# Patient Record
Sex: Female | Born: 1975
Health system: Southern US, Community
[De-identification: ages and names within clinical notes are randomized; demographics above are authoritative.]

## PROBLEM LIST (undated history)

## (undated) DIAGNOSIS — Z923 Personal history of irradiation: Secondary | ICD-10-CM

## (undated) DIAGNOSIS — D649 Anemia, unspecified: Secondary | ICD-10-CM

## (undated) DIAGNOSIS — B977 Papillomavirus as the cause of diseases classified elsewhere: Secondary | ICD-10-CM

## (undated) DIAGNOSIS — C099 Malignant neoplasm of tonsil, unspecified: Secondary | ICD-10-CM

## (undated) DIAGNOSIS — K219 Gastro-esophageal reflux disease without esophagitis: Secondary | ICD-10-CM

## (undated) HISTORY — DX: Anemia, unspecified: D64.9

## (undated) HISTORY — DX: Malignant neoplasm of tonsil, unspecified: C09.9

## (undated) HISTORY — DX: Papillomavirus as the cause of diseases classified elsewhere: B97.7

## (undated) HISTORY — DX: Gastro-esophageal reflux disease without esophagitis: K21.9

---

## 2003-05-28 ENCOUNTER — Other Ambulatory Visit: Admission: RE | Admit: 2003-05-28 | Discharge: 2003-05-28 | Payer: Self-pay

## 2013-12-27 ENCOUNTER — Other Ambulatory Visit (HOSPITAL_COMMUNITY): Payer: Self-pay | Admitting: Physician Assistant

## 2013-12-27 DIAGNOSIS — N921 Excessive and frequent menstruation with irregular cycle: Secondary | ICD-10-CM

## 2014-01-04 ENCOUNTER — Other Ambulatory Visit (HOSPITAL_COMMUNITY): Payer: Self-pay | Admitting: Physician Assistant

## 2014-01-04 DIAGNOSIS — N921 Excessive and frequent menstruation with irregular cycle: Secondary | ICD-10-CM

## 2014-01-07 ENCOUNTER — Ambulatory Visit (HOSPITAL_COMMUNITY): Payer: Self-pay

## 2014-01-07 ENCOUNTER — Ambulatory Visit (HOSPITAL_COMMUNITY)
Admission: RE | Admit: 2014-01-07 | Discharge: 2014-01-07 | Disposition: A | Discharge status: Home / Self Care | Payer: Self-pay | Source: Ambulatory Visit | Attending: Physician Assistant | Admitting: Physician Assistant

## 2014-01-07 ENCOUNTER — Ambulatory Visit (HOSPITAL_COMMUNITY): Admission: RE | Admit: 2014-01-07 | Payer: Self-pay | Source: Ambulatory Visit

## 2014-01-07 DIAGNOSIS — R938 Abnormal findings on diagnostic imaging of other specified body structures: Secondary | ICD-10-CM | POA: Insufficient documentation

## 2014-01-07 DIAGNOSIS — N920 Excessive and frequent menstruation with regular cycle: Secondary | ICD-10-CM | POA: Insufficient documentation

## 2014-01-07 DIAGNOSIS — N921 Excessive and frequent menstruation with irregular cycle: Secondary | ICD-10-CM

## 2014-01-07 DIAGNOSIS — N926 Irregular menstruation, unspecified: Secondary | ICD-10-CM | POA: Insufficient documentation

## 2014-01-07 DIAGNOSIS — N888 Other specified noninflammatory disorders of cervix uteri: Secondary | ICD-10-CM | POA: Insufficient documentation

## 2014-01-07 DIAGNOSIS — D251 Intramural leiomyoma of uterus: Secondary | ICD-10-CM | POA: Insufficient documentation

## 2014-02-18 ENCOUNTER — Emergency Department (HOSPITAL_COMMUNITY)
Admission: EM | Admit: 2014-02-18 | Discharge: 2014-02-18 | Disposition: A | Discharge status: Home / Self Care | Payer: Self-pay | Attending: Emergency Medicine | Admitting: Emergency Medicine

## 2014-02-18 ENCOUNTER — Encounter (HOSPITAL_COMMUNITY): Payer: Self-pay | Admitting: *Deleted

## 2014-02-18 DIAGNOSIS — Z9889 Other specified postprocedural states: Secondary | ICD-10-CM | POA: Insufficient documentation

## 2014-02-18 DIAGNOSIS — D500 Iron deficiency anemia secondary to blood loss (chronic): Secondary | ICD-10-CM | POA: Insufficient documentation

## 2014-02-18 DIAGNOSIS — R51 Headache: Secondary | ICD-10-CM | POA: Insufficient documentation

## 2014-02-18 DIAGNOSIS — N920 Excessive and frequent menstruation with regular cycle: Secondary | ICD-10-CM

## 2014-02-18 DIAGNOSIS — N939 Abnormal uterine and vaginal bleeding, unspecified: Secondary | ICD-10-CM | POA: Insufficient documentation

## 2014-02-18 LAB — CBC WITH DIFFERENTIAL/PLATELET
Basophils Absolute: 0 10*3/uL (ref 0.0–0.1)
Basophils Relative: 1 % (ref 0–1)
EOS ABS: 0.1 10*3/uL (ref 0.0–0.7)
Eosinophils Relative: 2 % (ref 0–5)
HCT: 28.3 % — ABNORMAL LOW (ref 36.0–46.0)
Hemoglobin: 9.1 g/dL — ABNORMAL LOW (ref 12.0–15.0)
LYMPHS ABS: 1.6 10*3/uL (ref 0.7–4.0)
Lymphocytes Relative: 27 % (ref 12–46)
MCH: 28.5 pg (ref 26.0–34.0)
MCHC: 32.2 g/dL (ref 30.0–36.0)
MCV: 88.7 fL (ref 78.0–100.0)
MONO ABS: 0.3 10*3/uL (ref 0.1–1.0)
MONOS PCT: 6 % (ref 3–12)
NEUTROS ABS: 3.8 10*3/uL (ref 1.7–7.7)
NEUTROS PCT: 64 % (ref 43–77)
Platelets: 289 10*3/uL (ref 150–400)
RBC: 3.19 MIL/uL — ABNORMAL LOW (ref 3.87–5.11)
RDW: 13.8 % (ref 11.5–15.5)
WBC: 5.9 10*3/uL (ref 4.0–10.5)

## 2014-02-18 LAB — BASIC METABOLIC PANEL
Anion gap: 2 — ABNORMAL LOW (ref 5–15)
BUN: 14 mg/dL (ref 6–23)
CO2: 27 mmol/L (ref 19–32)
Calcium: 8.6 mg/dL (ref 8.4–10.5)
Chloride: 109 mmol/L (ref 96–112)
Creatinine, Ser: 0.78 mg/dL (ref 0.50–1.10)
Glucose, Bld: 122 mg/dL — ABNORMAL HIGH (ref 70–99)
POTASSIUM: 3.7 mmol/L (ref 3.5–5.1)
Sodium: 138 mmol/L (ref 135–145)

## 2014-02-18 LAB — WET PREP, GENITAL
Clue Cells Wet Prep HPF POC: NONE SEEN
Trich, Wet Prep: NONE SEEN
YEAST WET PREP: NONE SEEN

## 2014-02-18 MED ORDER — MEGESTROL ACETATE 20 MG PO TABS: ORAL_TABLET | ORAL | Status: DC

## 2014-02-18 MED ORDER — FERROUS SULFATE 325 (65 FE) MG PO TABS: 325.0000 mg | ORAL_TABLET | Freq: Every day | ORAL | Status: AC

## 2014-02-18 NOTE — Discharge Instructions (Signed)
Call Dr. Johnnye Sima office and they may be able to see you sooner than you appointment.

## 2014-02-18 NOTE — ED Notes (Signed)
Vaginal bleeding since yesterday.  8-10 pads since yesterday.

## 2014-02-18 NOTE — ED Provider Notes (Signed)
CSN: [id number]     Arrival date & time 02/18/14  1336 History  This chart was scribed for [facility], NP with Richarda Blade, MD by Edison Simon, ED Scribe. This patient was seen in room APFT21/APFT21 and the patient's care was started at 4:22 PM.    Chief Complaint  Patient presents with  . Vaginal Bleeding   Patient is a 39 y.o. female presenting with vaginal bleeding. The history is provided by the patient and a relative. No language interpreter was used.  Vaginal Bleeding Quality:  Bright red Severity:  Moderate Onset quality:  Sudden Duration:  2 days Timing:  Constant Progression:  Unchanged Chronicity:  Recurrent Menstrual history:  Regular Number of pads used:  20 Possible pregnancy: no   Context: spontaneously   Relieved by:  None tried Worsened by:  Nothing tried Ineffective treatments:  None tried Associated symptoms: no back pain, no dysuria, no fever and no nausea   Risk factors: terminated pregnancy   Risk factors: no STD     HPI Comments: Heather Strickland is a 39 y.o. female who presents to the Emergency Department complaining of vaginal bleeding with onset yesterday. She states she used 10 pads yesterday and 8-10 today. She reports associated generalized weakness, lightheadedness, and headache. Per daughter who is translating, this is an ongoing problem but has been worse lately. She states the patient normally has a heavy period every month, but typically not as heavy as today. [facility] and had transvaginal and abdominal ultrasound; she was told to follow up with OBGYN but had difficulty doing so because she does not have insurance. She states she was recently approved and has not been seen yet. [facility] and was referred here. She states she was told in [place] she had cyst and inflamed area revealed by ultrasound but is unsure of specifics. She states she used to use birth control pills, last used 1  year ago. She states she last had sexual intercourse 1 year ago. She denies history of STIs. She reports 2 prior C-sections but denies other surgeries. She states she has been pregnant 6 times and has 4 living children; she had 2 abortions. She reports prior abnormal pap smear and anemia 6 years ago. She never followed up after the smear and states that is the last time she had any gynecological care. She notes that she occasionally has difficulty breathing and pain beneath her left breast but denies having this recently. She denies fever, chills, nausea, or vomiting. She denies urinary symptoms including dysuria, frequency, and back pain. She denies other respiratory symptoms including cough, cold, or congestion.   History reviewed. No pertinent past medical history. Past Surgical History  Procedure Laterality Date  . Cesarean section     History reviewed. No pertinent family history. History  Substance Use Topics  . Smoking status: Never Smoker   . Smokeless tobacco: Not on file  . Alcohol Use: No   OB History    No data available     Review of Systems  Constitutional: Negative for fever and chills.  HENT: Negative for congestion.   Respiratory: Negative for cough.   Gastrointestinal: Negative for nausea and vomiting.  Genitourinary: Positive for vaginal bleeding. Negative for dysuria, urgency, frequency and flank pain.  Musculoskeletal: Negative for back pain.  Neurological: Positive for weakness, light-headedness and headaches.  All other systems reviewed and are negative.     Allergies  Review of patient's allergies indicates no known allergies.  Home Medications   Prior to Admission medications   Medication Sig Start Date End Date Taking? Authorizing Provider  ferrous sulfate 325 (65 FE) MG tablet Take 1 tablet (325 mg total) by mouth daily. 02/18/14   Chester, NP  megestrol (MEGACE) 20 MG tablet Take two tablets (40 mg) three times per day time three days,  then  take two tablets (40 mg) two times per day time three days,  then take two tablets (40 mg)once per day 02/18/14   Ashley Murrain, NP   BP 123/76 mmHg  Pulse 75  Temp(Src) 98.2 F (36.8 C) (Oral)  Resp 16  Ht 5\' 7"  (1.702 m)  Wt 135 lb (61.236 kg)  BMI 21.14 kg/m2  SpO2 100%  LMP 01/15/2014 Physical Exam  Constitutional: She is oriented to person, place, and time. She appears well-developed and well-nourished. No distress.  HENT:  Head: Normocephalic and atraumatic.  Eyes: Conjunctivae and EOM are normal.  Neck: Normal range of motion. Neck supple.  Cardiovascular: Normal rate and regular rhythm.   Pulmonary/Chest: Effort normal and breath sounds normal.  Abdominal: Soft. Bowel sounds are normal. There is no tenderness.  Genitourinary:  External genitalia without lesions. Moderate vaginal bleeding with clots in vaginal vault. No vaginal or cervical lacerations noted. No CMT, no adnexal tenderness, uterus slightly enlarged.   Musculoskeletal: Normal range of motion.  Neurological: She is alert and oriented to person, place, and time. No cranial nerve deficit.  Skin: Skin is warm and dry.  Psychiatric: She has a normal mood and affect. Her behavior is normal.  Nursing note and vitals reviewed.   ED Course  Procedures (including critical care time)  DIAGNOSTIC STUDIES: Oxygen Saturation is 100% on room air, normal by my interpretation.    COORDINATION OF CARE: 4:36 PM Discussed treatment plan with patient at beside, including pelvic exam and consultation with gynecologist. The patient agrees with the plan and has no further questions at this time.  Ultrasound ordered from [facility] 12/28 showed a 1/4 cm complex lesion in the right uterine fundus suggestive of a small intramural fibroid. A 13 mm thick endometrium with mild heterogeneity diffusely but no focal abnormality identified. 2.3 cm likely CLC in the right ovary.   Labs Review Results for orders placed or performed during  the hospital encounter of 02/18/14 (from the past 24 hour(s))  CBC with Differential     Status: Abnormal   Collection Time: 02/18/14  3:01 PM  Result Value Ref Range   WBC 5.9 4.0 - 10.5 K/uL   RBC 3.19 (L) 3.87 - 5.11 MIL/uL   Hemoglobin 9.1 (L) 12.0 - 15.0 g/dL   HCT 28.3 (L) 36.0 - 46.0 %   MCV 88.7 78.0 - 100.0 fL   MCH 28.5 26.0 - 34.0 pg   MCHC 32.2 30.0 - 36.0 g/dL   RDW 13.8 11.5 - 15.5 %   Platelets 289 150 - 400 K/uL   Neutrophils Relative % 64 43 - 77 %   Neutro Abs 3.8 1.7 - 7.7 K/uL   Lymphocytes Relative 27 12 - 46 %   Lymphs Abs 1.6 0.7 - 4.0 K/uL   Monocytes Relative 6 3 - 12 %   Monocytes Absolute 0.3 0.1 - 1.0 K/uL   Eosinophils Relative 2 0 - 5 %   Eosinophils Absolute 0.1 0.0 - 0.7 K/uL   Basophils Relative 1 0 - 1 %   Basophils Absolute 0.0  0.0 - 0.1 K/uL  Basic metabolic panel     Status: Abnormal   Collection Time: 02/18/14  3:01 PM  Result Value Ref Range   Sodium 138 135 - 145 mmol/L   Potassium 3.7 3.5 - 5.1 mmol/L   Chloride 109 96 - 112 mmol/L   CO2 27 19 - 32 mmol/L   Glucose, Bld 122 (H) 70 - 99 mg/dL   BUN 14 6 - 23 mg/dL   Creatinine, Ser 0.78 0.50 - 1.10 mg/dL   Calcium 8.6 8.4 - 10.5 mg/dL   GFR calc non Af Amer >90 >90 mL/min   GFR calc Af Amer >90 >90 mL/min   Anion gap 2 (L) 5 - 15  Wet prep, genital     Status: Abnormal   Collection Time: 02/18/14  5:24 PM  Result Value Ref Range   Yeast Wet Prep HPF POC NONE SEEN NONE SEEN   Trich, Wet Prep NONE SEEN NONE SEEN   Clue Cells Wet Prep HPF POC NONE SEEN NONE SEEN   WBC, Wet Prep HPF POC FEW (A) NONE SEEN  BP 123/76 mmHg  Pulse 75  Temp(Src) 98.2 F (36.8 C) (Oral)  Resp 16  Ht 5\' 7"  (1.702 m)  Wt 135 lb (61.236 kg)  BMI 21.14 kg/m2  SpO2 100%  LMP 01/15/2014  Consult with Dr. Glo Herring, will start Megace and she will follow up in the office.  The patient is not orthostatic  MDM  39 y.o. female with heavy vaginal bleeding and feeling weak for the past 2 days. Stable for d/c  without uterine hemorrhage. Will treat with Megace for bleeding and iron for anemia and Dr. Glo Herring will see the patient in the office for follow up. Discussed findings and plan of care with the patient using the translator and she voices understanding and agrees with plan.  Final diagnoses:  Excessive vaginal bleeding  Anemia due to blood loss   I personally performed the services described in this documentation, which was scribed in my presence. The recorded information has been reviewed and is accurate.    [address] [place], NP 02/18/14 [place], MD 02/19/14 [phone]

## 2014-02-19 LAB — HIV ANTIBODY (ROUTINE TESTING W REFLEX): HIV SCREEN 4TH GENERATION: NONREACTIVE

## 2014-02-19 LAB — GC/CHLAMYDIA PROBE AMP (~~LOC~~) NOT AT ARMC
CHLAMYDIA, DNA PROBE: NEGATIVE
Neisseria Gonorrhea: NEGATIVE

## 2014-02-19 LAB — RPR: RPR Ser Ql: NONREACTIVE

## 2014-02-20 ENCOUNTER — Encounter: Payer: Self-pay | Admitting: Obstetrics & Gynecology

## 2014-02-21 ENCOUNTER — Ambulatory Visit (INDEPENDENT_AMBULATORY_CARE_PROVIDER_SITE_OTHER): Payer: Self-pay | Admitting: Obstetrics and Gynecology

## 2014-02-21 ENCOUNTER — Encounter: Payer: Self-pay | Admitting: Obstetrics and Gynecology

## 2014-02-21 VITALS — BP 90/60 | Wt 130.0 lb

## 2014-02-21 DIAGNOSIS — N920 Excessive and frequent menstruation with regular cycle: Secondary | ICD-10-CM

## 2014-02-21 NOTE — Progress Notes (Addendum)
Patient ID: Heather Strickland, female   DOB: [birth year], 39 y.o.   MRN: [id number]  This chart was scribed for Heather Kind, MD by Donato Schultz, ED Scribe. This patient was seen in Room 1 and the patient's care was started at 2:34 PM.   [facility] Visit  Patient name: Heather Strickland MRN [id number]  Date of birth: [birth year]  CC & HPI:  [name] Busing is a 39 y.o. female with a history of anemia presenting today for follow-up from a recent hospitalization.  She was admitted to [facility] ED on 02/18/2014 for heavy vaginal bleeding with associated weakness.  She was discharged with Megace and Iron supplements.  She is not in any pain currently but she does experience pain with her menses.  She has not been sexually active for a year and does not use any contraception.    ROS:  All systems have been reviewed and are negative unless otherwise indicated in the HPI.   Pertinent History Reviewed:   Reviewed: Significant for cesarean section and anemia  Medical         Past Medical History  Diagnosis Date  . Anemia                               Surgical Hx:    Past Surgical History  Procedure Laterality Date  . Cesarean section     Medications: Reviewed & Updated - see associated section                       Current outpatient prescriptions:  .  ferrous sulfate 325 (65 FE) MG tablet, Take 1 tablet (325 mg total) by mouth daily., Disp: 30 tablet, Rfl: 0 .  megestrol (MEGACE) 20 MG tablet, Take two tablets (40 mg) three times per day time three days,  then take two tablets (40 mg) two times per day time three days,  then take two tablets (40 mg)once per day, Disp: 50 tablet, Rfl: 0   Social History: Reviewed -  reports that she has never smoked. She has never used smokeless tobacco.  Objective Findings:  Vitals: Blood pressure 90/60, weight 130 lb (58.968 kg), last menstrual period 02/16/2014.  Physical Examination: General appearance - alert, well appearing,  and in no distress, oriented to person, place, and time and normal appearing weight Pelvic - normal external genitalia, vulva, vagina, cervix, uterus and adnexa,  VULVA: normal appearing vulva with no masses, tenderness or lesions,  VAGINA: normal appearing vagina with normal color and discharge, no lesions,  CERVIX:  large multiparous cervix without discharge or lesions, well supported UTERUS: uterus is normal size, shape, consistency and nontender, anterior, upper limits normal size ADNEXA: normal adnexa in size, nontender and no masses  Assessment & Plan:   A:  1. Menorrhagia, needs contraception  P:  1. Family planning candidate, eligible at [facility] Department for IUD under state systems that would cover iud placement     I personally performed the services described in this documentation, which was scribed in my presence. The recorded information has been reviewed and considered accurate. It has been edited as necessary during review. Heather Kind, MD

## 2014-02-21 NOTE — Progress Notes (Signed)
Patient ID: Heather Strickland, female   DOB: 1975/07/15, 39 y.o.   MRN: [id number] Pt was seen in the ED for heavy bleeding and was given Megace and Iron .

## 2014-02-27 ENCOUNTER — Other Ambulatory Visit (HOSPITAL_COMMUNITY): Payer: Self-pay | Admitting: Nurse Practitioner

## 2014-02-27 DIAGNOSIS — Z1231 Encounter for screening mammogram for malignant neoplasm of breast: Secondary | ICD-10-CM

## 2014-03-04 ENCOUNTER — Ambulatory Visit (HOSPITAL_COMMUNITY)
Admission: RE | Admit: 2014-03-04 | Discharge: 2014-03-04 | Disposition: A | Discharge status: Home / Self Care | Payer: Self-pay | Source: Ambulatory Visit | Attending: Nurse Practitioner | Admitting: Nurse Practitioner

## 2014-03-04 DIAGNOSIS — Z1231 Encounter for screening mammogram for malignant neoplasm of breast: Secondary | ICD-10-CM

## 2015-06-18 DIAGNOSIS — Z139 Encounter for screening, unspecified: Secondary | ICD-10-CM

## 2015-06-19 ENCOUNTER — Ambulatory Visit: Payer: Self-pay | Admitting: Physician Assistant

## 2015-06-19 ENCOUNTER — Encounter: Payer: Self-pay | Admitting: Physician Assistant

## 2015-06-19 VITALS — BP 98/70 | HR 73 | Temp 97.9°F | Ht 62.0 in | Wt 168.0 lb

## 2015-06-19 DIAGNOSIS — E669 Obesity, unspecified: Secondary | ICD-10-CM

## 2015-06-19 DIAGNOSIS — J029 Acute pharyngitis, unspecified: Secondary | ICD-10-CM

## 2015-06-19 DIAGNOSIS — K219 Gastro-esophageal reflux disease without esophagitis: Secondary | ICD-10-CM

## 2015-06-19 DIAGNOSIS — R7303 Prediabetes: Secondary | ICD-10-CM | POA: Insufficient documentation

## 2015-06-19 DIAGNOSIS — D649 Anemia, unspecified: Secondary | ICD-10-CM | POA: Insufficient documentation

## 2015-06-19 LAB — HEMOGLOBIN: Hemoglobin: 12.8 g/dL (ref 11.7–15.5)

## 2015-06-19 LAB — HEMATOCRIT: HCT: 38.8 % (ref 35.0–45.0)

## 2015-06-19 LAB — POCT RAPID STREP A (OFFICE): Rapid Strep A Screen: NEGATIVE

## 2015-06-19 LAB — HEMOGLOBIN A1C
HEMOGLOBIN A1C: 6.2 % — AB (ref <5.7)
MEAN PLASMA GLUCOSE: 131 mg/dL

## 2015-06-19 MED ORDER — PANTOPRAZOLE SODIUM 40 MG PO TBEC: 40.0000 mg | DELAYED_RELEASE_TABLET | Freq: Every day | ORAL | Status: DC

## 2015-06-19 NOTE — Progress Notes (Signed)
BP 98/70 mmHg  Pulse 73  Temp(Src) 97.9 F (36.6 C)  Ht 5\' 2"  (1.575 m)  Wt 168 lb (76.204 kg)  BMI 30.72 kg/m2  SpO2 97%   Subjective:    Patient ID: Heather Strickland, female    DOB: [birth year], 40 y.o.   MRN: [id number]  HPI: Heather Strickland is a 40 y.o. female presenting on 06/19/2015 for New Patient (Initial Visit)   HPI   Pt seen here dec 2015 and jan 2016.  Pt c/o pain with swallowing.  She c/o her spit is dark brown.  She says she has streaks of blood in her saliva when she brushes her teeth.  She does not vomit blood.  She c/o pain with swallowing for about a week.  No cough. No fever. + R EA. No nasal congestion. + sneezing.   She works doing sewing  Relevant past medical, surgical, family and social history reviewed and updated as indicated. Interim medical history since our last visit reviewed. Allergies and medications reviewed and updated.  CURRENT MEDS: Iron mirena  Review of Systems  Constitutional: Positive for chills and fatigue. Negative for fever, diaphoresis, appetite change and unexpected weight change.  HENT: Positive for congestion, ear pain, hearing loss, sneezing, sore throat, trouble swallowing and voice change. Negative for dental problem, drooling, facial swelling and mouth sores.   Eyes: Positive for itching. Negative for pain, discharge, redness and visual disturbance.  Respiratory: Positive for choking, chest tightness and shortness of breath. Negative for cough and wheezing.   Cardiovascular: Positive for palpitations. Negative for chest pain and leg swelling.  Gastrointestinal: Negative for vomiting, abdominal pain, diarrhea, constipation and blood in stool.  Endocrine: Positive for cold intolerance and polydipsia. Negative for heat intolerance.  Genitourinary: Negative for dysuria, hematuria and decreased urine volume.  Musculoskeletal: Positive for back pain. Negative for arthralgias and gait problem.  Skin: Negative for rash.   Allergic/Immunologic: Positive for environmental allergies.  Neurological: Negative for seizures, syncope, light-headedness and headaches.  Hematological: Negative for adenopathy.  Psychiatric/Behavioral: Positive for dysphoric mood and agitation. Negative for suicidal ideas. The patient is nervous/anxious.     Per HPI unless specifically indicated above     Objective:    BP 98/70 mmHg  Pulse 73  Temp(Src) 97.9 F (36.6 C)  Ht 5\' 2"  (1.575 m)  Wt 168 lb (76.204 kg)  BMI 30.72 kg/m2  SpO2 97%  Wt Readings from Last 3 Encounters:  06/19/15 168 lb (76.204 kg)  02/21/14 130 lb (58.968 kg)  02/18/14 135 lb (61.236 kg)    Physical Exam  Constitutional: She is oriented to person, place, and time. She appears well-developed and well-nourished.  HENT:  Head: Normocephalic and atraumatic.  Right Ear: Hearing, tympanic membrane, external ear and ear canal normal.  Left Ear: Hearing, tympanic membrane, external ear and ear canal normal.  Nose: Nose normal.  Mouth/Throat: Uvula is midline and oropharynx is clear and moist. No oropharyngeal exudate.  Eyes: Conjunctivae and EOM are normal. Pupils are equal, round, and reactive to light.  Neck: Neck supple. No thyromegaly present.  Cardiovascular: Normal rate and regular rhythm.   Pulmonary/Chest: Effort normal and breath sounds normal. She has no wheezes.  Abdominal: Soft. Bowel sounds are normal. She exhibits no mass. There is no hepatosplenomegaly. There is no tenderness.  Musculoskeletal: She exhibits no edema.  Lymphadenopathy:    She has no cervical adenopathy.  Neurological: She is alert and oriented to person, place, and time. Gait normal.  Skin: Skin is  warm and dry.  Psychiatric: She has a normal mood and affect. Her behavior is normal.  Vitals reviewed.   RST negative    Assessment & Plan:   Encounter Diagnoses  Name Primary?  . Gastroesophageal reflux disease, esophagitis presence not specified Yes  . Prediabetes    . Obesity, unspecified   . Anemia, unspecified anemia type   . Sore throat     -rx protonix -Also encouraged to take otc antihistamine -Get labs drawn today (before starting protonix) -F/u 1 month.  RTO sooner prn

## 2015-06-20 LAB — H. PYLORI BREATH TEST: H. pylori Breath Test: DETECTED — AB

## 2015-07-21 ENCOUNTER — Encounter: Payer: Self-pay | Admitting: Physician Assistant

## 2015-07-21 ENCOUNTER — Ambulatory Visit: Payer: Self-pay | Admitting: Physician Assistant

## 2015-07-21 VITALS — BP 104/60 | HR 84 | Temp 97.9°F | Ht 62.0 in | Wt 170.6 lb

## 2015-07-21 DIAGNOSIS — E669 Obesity, unspecified: Secondary | ICD-10-CM

## 2015-07-21 DIAGNOSIS — A048 Other specified bacterial intestinal infections: Secondary | ICD-10-CM | POA: Insufficient documentation

## 2015-07-21 DIAGNOSIS — D649 Anemia, unspecified: Secondary | ICD-10-CM

## 2015-07-21 DIAGNOSIS — K219 Gastro-esophageal reflux disease without esophagitis: Secondary | ICD-10-CM

## 2015-07-21 DIAGNOSIS — R7303 Prediabetes: Secondary | ICD-10-CM

## 2015-07-21 MED ORDER — PANTOPRAZOLE SODIUM 40 MG PO TBEC: 40.0000 mg | DELAYED_RELEASE_TABLET | Freq: Every day | ORAL | Status: DC

## 2015-07-21 MED ORDER — AMOXICILLIN 500 MG PO CAPS: ORAL_CAPSULE | ORAL | Status: DC

## 2015-07-21 MED ORDER — CLARITHROMYCIN 500 MG PO TABS: 500.0000 mg | ORAL_TABLET | Freq: Two times a day (BID) | ORAL | Status: DC

## 2015-07-21 NOTE — Patient Instructions (Addendum)
Take pantoprazole twice daily with your two antibiotics for the h pylori infection.  Tome pantoprazole dos veces diarios con su antibioticos para la infeccion Hpylori.

## 2015-07-21 NOTE — Progress Notes (Signed)
BP 104/60 mmHg  Pulse 84  Temp(Src) 97.9 F (36.6 C)  Ht 5\' 2"  (1.575 m)  Wt 170 lb 9.6 oz (77.384 kg)  BMI 31.20 kg/m2  SpO2 97%   Subjective:    Patient ID: Heather Strickland, female    DOB: 10/23/1975, 41 y.o.   MRN: [id number]  HPI: Heather Strickland is a 40 y.o. female presenting on 07/21/2015 for Gastroesophageal Reflux and Anemia   HPI   Pt states her swallowing is improved since starting the protonix.   Relevant past medical, surgical, family and social history reviewed and updated as indicated. Interim medical history since our last visit reviewed. Allergies and medications reviewed and updated.  Current outpatient prescriptions:  .  ferrous sulfate 325 (65 FE) MG tablet, Take 1 tablet (325 mg total) by mouth daily., Disp: 30 tablet, Rfl: 0 .  levonorgestrel (MIRENA) 20 MCG/24HR IUD, 1 each by Intrauterine route once., Disp: , Rfl:  .  pantoprazole (PROTONIX) 40 MG tablet, Take 1 tablet (40 mg total) by mouth daily. Tome una tableta por boca diaria, Disp: 30 tablet, Rfl: 1   Review of Systems  Constitutional: Positive for fatigue. Negative for fever, chills, diaphoresis, appetite change and unexpected weight change.  HENT: Positive for dental problem. Negative for congestion, drooling, ear pain, facial swelling, hearing loss, mouth sores, sneezing, sore throat, trouble swallowing and voice change.   Eyes: Negative for pain, discharge, redness, itching and visual disturbance.  Respiratory: Positive for cough. Negative for choking, shortness of breath and wheezing.   Cardiovascular: Negative for chest pain, palpitations and leg swelling.  Gastrointestinal: Negative for vomiting, abdominal pain, diarrhea, constipation and blood in stool.  Endocrine: Negative for cold intolerance, heat intolerance and polydipsia.  Genitourinary: Negative for dysuria, hematuria and decreased urine volume.  Musculoskeletal: Negative for back pain, arthralgias and gait problem.   Skin: Negative for rash.  Allergic/Immunologic: Negative for environmental allergies.  Neurological: Negative for seizures, syncope, light-headedness and headaches.  Hematological: Negative for adenopathy.  Psychiatric/Behavioral: Negative for suicidal ideas, dysphoric mood and agitation. The patient is nervous/anxious.     Per HPI unless specifically indicated above     Objective:    BP 104/60 mmHg  Pulse 84  Temp(Src) 97.9 F (36.6 C)  Ht 5\' 2"  (1.575 m)  Wt 170 lb 9.6 oz (77.384 kg)  BMI 31.20 kg/m2  SpO2 97%  Wt Readings from Last 3 Encounters:  07/21/15 170 lb 9.6 oz (77.384 kg)  06/19/15 168 lb (76.204 kg)  02/21/14 130 lb (58.968 kg)    Physical Exam  Constitutional: She is oriented to person, place, and time. She appears well-developed and well-nourished.  HENT:  Head: Normocephalic and atraumatic.  Neck: Neck supple.  Cardiovascular: Normal rate and regular rhythm.   Pulmonary/Chest: Effort normal and breath sounds normal.  Abdominal: Soft. Bowel sounds are normal. She exhibits no mass. There is no hepatosplenomegaly. There is no tenderness.  Musculoskeletal: She exhibits no edema.  Lymphadenopathy:    She has no cervical adenopathy.  Neurological: She is alert and oriented to person, place, and time.  Skin: Skin is warm and dry.  Psychiatric: She has a normal mood and affect. Her behavior is normal.  Vitals reviewed.   Results for orders placed or performed in visit on 06/19/15  Hemoglobin  Result Value Ref Range   Hemoglobin 12.8 11.7 - 15.5 g/dL  Hematocrit  Result Value Ref Range   HCT 38.8 35.0 - 45.0 %  H. pylori breath test  Result Value  Ref Range   H. pylori Breath Test DETECTED (A) Not Detected  HgB A1c  Result Value Ref Range   Hgb A1c MFr Bld 6.2 (H) <5.7 %   Mean Plasma Glucose 131 mg/dL  POCT rapid strep A  Result Value Ref Range   Rapid Strep A Screen Negative Negative      Assessment & Plan:   Encounter Diagnoses  Name Primary?   . H. pylori infection Yes  . Gastroesophageal reflux disease, esophagitis presence not specified   . Anemia, unspecified anemia type   . Prediabetes   . Obesity, unspecified     -reviewed labs with pt -anemia doing good -treat h pylori- amoxil, clarithromycin, protonix (bid while on the abx) -counseled on Prediabetes and handout given -f/u 2 months.  RTO sooner prn

## 2015-09-11 NOTE — Congregational Nurse Program (Signed)
Congregational Nurse Program Note  Date of Encounter: 06/18/2015  Past Medical History: Past Medical History:  Diagnosis Date  . Anemia   . GERD (gastroesophageal reflux disease)     Encounter Details:  Client was assisted with appointment at the [facility] in [place]. Client has complaints of spitting up blood when brushing teeth. VS today BP- 103 /74 P-74. Client does state having a sore throat about a month ago, throat feels a little swollen. Tarri Fuller CNP [phone]

## 2015-09-22 ENCOUNTER — Ambulatory Visit: Payer: Self-pay | Admitting: Physician Assistant

## 2015-09-29 ENCOUNTER — Ambulatory Visit: Payer: Self-pay | Admitting: Physician Assistant

## 2015-09-29 ENCOUNTER — Encounter: Payer: Self-pay | Admitting: Physician Assistant

## 2015-09-29 VITALS — BP 120/74 | HR 84 | Temp 98.1°F | Ht 62.0 in | Wt 167.4 lb

## 2015-09-29 DIAGNOSIS — K219 Gastro-esophageal reflux disease without esophagitis: Secondary | ICD-10-CM

## 2015-09-29 DIAGNOSIS — R1310 Dysphagia, unspecified: Secondary | ICD-10-CM

## 2015-09-29 NOTE — Progress Notes (Signed)
BP 120/74 (BP Location: Left Arm, Patient Position: Sitting, Cuff Size: Normal)   Pulse 84   Temp 98.1 F (36.7 C)   Ht 5\' 2"  (1.575 m)   Wt 167 lb 6.4 oz (75.9 kg)   SpO2 97%   BMI 30.62 kg/m    Subjective:    Patient ID: Heather Strickland, female    DOB: [birth year], 40 y.o.   MRN: [id number]  HPI: Heather Strickland is a 40 y.o. female presenting on 09/29/2015 for Follow-up   HPI   Pt took her h pylori meds. She is feeling better- her stomach.  Her throat feels tight when she swallows. Pt also says she has small amount blood when she spits  Relevant past medical, surgical, family and social history reviewed and updated as indicated. Interim medical history since our last visit reviewed. Allergies and medications reviewed and updated.   Current Outpatient Prescriptions:  .  ferrous sulfate 325 (65 FE) MG tablet, Take 1 tablet (325 mg total) by mouth daily., Disp: 30 tablet, Rfl: 0 .  levonorgestrel (MIRENA) 20 MCG/24HR IUD, 1 each by Intrauterine route once., Disp: , Rfl:  .  pantoprazole (PROTONIX) 40 MG tablet, Take 1 tablet (40 mg total) by mouth daily. Tome una tableta por boca diaria, Disp: 30 tablet, Rfl: 1  Review of Systems  Constitutional: Negative for appetite change, chills, diaphoresis, fatigue, fever and unexpected weight change.  HENT: Positive for sneezing and voice change. Negative for congestion, dental problem, drooling, ear pain, facial swelling, hearing loss, mouth sores, sore throat and trouble swallowing.   Eyes: Negative for pain, discharge, redness, itching and visual disturbance.  Respiratory: Negative for cough, choking, shortness of breath and wheezing.   Cardiovascular: Negative for chest pain, palpitations and leg swelling.  Gastrointestinal: Negative for abdominal pain, blood in stool, constipation, diarrhea and vomiting.  Endocrine: Positive for cold intolerance and polydipsia. Negative for heat intolerance.  Genitourinary: Negative  for decreased urine volume, dysuria and hematuria.  Musculoskeletal: Positive for back pain. Negative for arthralgias and gait problem.  Skin: Negative for rash.  Allergic/Immunologic: Negative for environmental allergies.  Neurological: Negative for seizures, syncope, light-headedness and headaches.  Hematological: Negative for adenopathy.  Psychiatric/Behavioral: Negative for agitation, dysphoric mood and suicidal ideas. The patient is not nervous/anxious.     Per HPI unless specifically indicated above     Objective:    BP 120/74 (BP Location: Left Arm, Patient Position: Sitting, Cuff Size: Normal)   Pulse 84   Temp 98.1 F (36.7 C)   Ht 5\' 2"  (1.575 m)   Wt 167 lb 6.4 oz (75.9 kg)   SpO2 97%   BMI 30.62 kg/m   Wt Readings from Last 3 Encounters:  09/29/15 167 lb 6.4 oz (75.9 kg)  07/21/15 170 lb 9.6 oz (77.4 kg)  06/19/15 168 lb (76.2 kg)    Physical Exam  Constitutional: She is oriented to person, place, and time. She appears well-developed and well-nourished.  HENT:  Head: Normocephalic and atraumatic.  Neck: Neck supple.  Cardiovascular: Normal rate and regular rhythm.   Pulmonary/Chest: Effort normal and breath sounds normal.  Abdominal: Soft. Bowel sounds are normal. She exhibits no mass. There is no hepatosplenomegaly. There is no tenderness.  Musculoskeletal: She exhibits no edema.  Lymphadenopathy:    She has no cervical adenopathy.  Neurological: She is alert and oriented to person, place, and time.  Skin: Skin is warm and dry.  Psychiatric: She has a normal mood and affect. Her behavior is normal.  Vitals reviewed.       Assessment & Plan:   Encounter Diagnoses  Name Primary?  . Gastroesophageal reflux disease, esophagitis presence not specified Yes  . Dysphagia     -Refer to GI. Gave [facility] discount application -pt to continue current medications -F/u 1 month to make sure pt has turned in her CD application and referral made.  RTO sooner prn  worsening or new symptoms

## 2015-10-01 ENCOUNTER — Encounter: Payer: Self-pay | Admitting: Physician Assistant

## 2015-10-02 ENCOUNTER — Encounter: Payer: Self-pay | Admitting: Internal Medicine

## 2015-10-23 ENCOUNTER — Other Ambulatory Visit: Payer: Self-pay

## 2015-10-23 ENCOUNTER — Encounter: Payer: Self-pay | Admitting: Nurse Practitioner

## 2015-10-23 ENCOUNTER — Ambulatory Visit (INDEPENDENT_AMBULATORY_CARE_PROVIDER_SITE_OTHER): Payer: Self-pay | Admitting: Nurse Practitioner

## 2015-10-23 VITALS — BP 128/81 | HR 70 | Temp 98.0°F | Ht 62.0 in | Wt 164.6 lb

## 2015-10-23 DIAGNOSIS — K219 Gastro-esophageal reflux disease without esophagitis: Secondary | ICD-10-CM

## 2015-10-23 DIAGNOSIS — A048 Other specified bacterial intestinal infections: Secondary | ICD-10-CM

## 2015-10-23 NOTE — Patient Instructions (Signed)
1. Continue taking Protonix 2. We will schedule your procedure for you 3. Follow-up here based on what they tell you after your procedure

## 2015-10-23 NOTE — Progress Notes (Signed)
Primary Care Physician:  Soyla Dryer, PA-C Primary Gastroenterologist:  Dr. Gala Romney  Chief Complaint  Patient presents with  . Dysphagia    x1 year  . Gastroesophageal Reflux    HPI:   Heather Strickland is a 40 y.o. female who presents On referral from primary care for evaluation of GERD and dysphagia. PCP notes reviewed. The patient was seen 06/19/2015 for odynophagia. She was given a prescription for Protonix. She was reevaluated 07/21/2015 and indicated improving symptoms on Protonix. H. pylori breath test was noted to be positive and she was started on a regimen of Amoxil, clarithromycin, and Protonix. Most recent visit on 09/29/2015 noted improved stomach symptoms after treatment of H. pylori but noted some dysphagia symptoms. She was referred to GI. It is also noted she is intermittently anemic but is seen for menorrhagia and has been on po iron.  Patient initially had daughter-in-law on the phone to translate. She was told we need to use an Research officer, political party and the translator line was contacted. Also, her demographics were updated by the staff to list primary language as Spanish and "translator needed" changed to "yes."   Today she states her stomach isn't better. She has had throat bleeding. Heartburn symptoms initially improved but is having esophageal burning. Denies dysphagia and odynophagia. Burning is worse after eating, worse with spicy foods or chili. When asked to point to where it hurts she rubs her upper-esophageal area.  Protonix has helped. Denies abdominal pain. Has nausea which is worse after eating, no vomiting. Denies hematochezia, melena. Stools are dark, is taking iron supplement. Denies chest pain, dyspnea, dizziness, lightheadedness, syncope, near syncope. Denies any other upper or lower GI symptoms.  Past Medical History:  Diagnosis Date  . Anemia   . GERD (gastroesophageal reflux disease)     Past Surgical History:  Procedure Laterality Date  .  CESAREAN SECTION      Current Outpatient Prescriptions  Medication Sig Dispense Refill  . levonorgestrel (MIRENA) 20 MCG/24HR IUD 1 each by Intrauterine route once.    . pantoprazole (PROTONIX) 40 MG tablet Take 1 tablet (40 mg total) by mouth daily. Tome una tableta por boca diaria 30 tablet 1  . ferrous sulfate 325 (65 FE) MG tablet Take 1 tablet (325 mg total) by mouth daily. (Patient not taking: Reported on 10/23/2015) 30 tablet 0   No current facility-administered medications for this visit.     Allergies as of 10/23/2015  . (No Known Allergies)    Family History  Problem Relation Age of Onset  . Diabetes Mother   . Hyperlipidemia Mother     Social History   Social History  . Marital status: Single    Spouse name: N/A  . Number of children: N/A  . Years of education: N/A   Occupational History  . Not on file.   Social History Main Topics  . Smoking status: Never Smoker  . Smokeless tobacco: Never Used  . Alcohol use No  . Drug use: No  . Sexual activity: Not Currently    Birth control/ protection: None, IUD   Other Topics Concern  . Not on file   Social History Narrative  . No narrative on file    Review of Systems: General: Negative for anorexia, weight loss, fever, chills, fatigue, weakness. Eyes: Negative for vision changes.  ENT: Negative for hoarseness, difficulty swallowing , nasal congestion. CV: Negative for chest pain, angina, palpitations, dyspnea on exertion, peripheral edema.  Respiratory: Negative for dyspnea at rest,  dyspnea on exertion, cough, sputum, wheezing.  GI: See history of present illness. GU:  Negative for dysuria, hematuria, urinary incontinence, urinary frequency, nocturnal urination.  MS: Negative for joint pain, low back pain.  Derm: Negative for rash or itching.  Neuro: Negative for weakness, abnormal sensation, seizure, frequent headaches, memory loss, confusion.  Psych: Negative for anxiety, depression, suicidal ideation,  hallucinations.  Endo: Negative for unusual weight change.  Heme: Negative for bruising or bleeding. Allergy: Negative for rash or hives.    Physical Exam: BP 128/81   Pulse 70   Temp 98 F (36.7 C) (Oral)   Ht 5\' 2"  (1.575 m)   Wt 164 lb 9.6 oz (74.7 kg)   BMI 30.11 kg/m  General:   Alert and oriented. Pleasant and cooperative. Well-nourished and well-developed.  Head:  Normocephalic and atraumatic. Eyes:  Without icterus, sclera clear and conjunctiva pink.  Ears:  Normal auditory acuity. Mouth:  No deformity or lesions, oral mucosa pink.  Throat/Neck:  Supple, without mass or thyromegaly. Cardiovascular:  S1, S2 present without murmurs appreciated. Normal pulses noted. Extremities without clubbing or edema. Respiratory:  Clear to auscultation bilaterally. No wheezes, rales, or rhonchi. No distress.  Gastrointestinal:  +BS, soft, non-tender and non-distended. No HSM noted. No guarding or rebound. No masses appreciated.  Rectal:  Deferred  Musculoskalatal:  Symmetrical without gross deformities. Normal posture. Skin:  Intact without significant lesions or rashes. Neurologic:  Alert and oriented x4;  grossly normal neurologically. Psych:  Alert and cooperative. Normal mood and affect. Heme/Lymph/Immune: No significant cervical adenopathy. No excessive bruising noted.    10/23/2015 8:19 AM   Disclaimer: This note was dictated with voice recognition software. Similar sounding words can inadvertently be transcribed and may not be corrected upon review.

## 2015-10-23 NOTE — Progress Notes (Signed)
CC'ED TO PCP 

## 2015-10-23 NOTE — Assessment & Plan Note (Signed)
Recent H. pylori by hydrogen breath test status post treatment with triple therapy. Her symptoms are persistent, although improved. We'll proceed with upper endoscopy as noted above.

## 2015-10-23 NOTE — Assessment & Plan Note (Signed)
The patient has complained of GERD symptoms including esophageal burning, nausea. She thinks is causing her to cough up some blood. She was started on Protonix which has helped, but her symptoms have returned. She also had H. pylori and is status post treatment as noted below. Given her persistent symptoms, history of H. pylori recently, we will proceed with upper endoscopy to further evaluate. Continue Protonix for now. Return for follow-up based on postprocedure recommendations.  Proceed with EGD with Dr. Gala Romney in near future: the risks, benefits, and alternatives have been discussed with the patient in detail. The patient states understanding and desires to proceed.  The patient is not on any anticoagulants, anxiolytics, chronic pain medications, or antidepressants. Conscious sedation should be adequate for her procedure.

## 2015-10-29 ENCOUNTER — Ambulatory Visit: Payer: Self-pay | Admitting: Physician Assistant

## 2015-10-29 ENCOUNTER — Encounter: Payer: Self-pay | Admitting: Physician Assistant

## 2015-10-29 VITALS — BP 116/72 | HR 85 | Temp 98.1°F | Ht 62.0 in | Wt 167.6 lb

## 2015-10-29 DIAGNOSIS — K219 Gastro-esophageal reflux disease without esophagitis: Secondary | ICD-10-CM

## 2015-10-29 DIAGNOSIS — D649 Anemia, unspecified: Secondary | ICD-10-CM

## 2015-10-29 LAB — HEMOGLOBIN: HEMOGLOBIN: 12.5 g/dL (ref 11.7–15.5)

## 2015-10-29 NOTE — Progress Notes (Signed)
   BP 116/72 (BP Location: Left Arm, Patient Position: Sitting, Cuff Size: Normal)   Pulse 85   Temp 98.1 F (36.7 C) (Other (Comment))   Ht 5\' 2"  (1.575 m)   Wt 167 lb 9.6 oz (76 kg)   SpO2 98%   BMI 30.65 kg/m    Subjective:    Patient ID: Heather Strickland, female    DOB: 05-25-1975, 40 y.o.   MRN: [id number]  HPI: Heather Strickland is a 40 y.o. female presenting on 10/29/2015 for Dysphagia   HPI   Pt seen by GI for dysphagia.  She has EGD scheduled for next week.   Relevant past medical, surgical, family and social history reviewed and updated as indicated. Interim medical history since our last visit reviewed. Allergies and medications reviewed and updated.   Current Outpatient Prescriptions:  .  ferrous sulfate 325 (65 FE) MG tablet, Take 1 tablet (325 mg total) by mouth daily., Disp: 30 tablet, Rfl: 0 .  levonorgestrel (MIRENA) 20 MCG/24HR IUD, 1 each by Intrauterine route once., Disp: , Rfl:  .  pantoprazole (PROTONIX) 40 MG tablet, Take 1 tablet (40 mg total) by mouth daily. Tome una tableta por boca diaria, Disp: 30 tablet, Rfl: 1  Review of Systems  Constitutional: Negative for fever.  Respiratory: Negative for shortness of breath.   Cardiovascular: Negative for chest pain.  Gastrointestinal: Negative for abdominal pain.  Psychiatric/Behavioral: Negative for agitation, dysphoric mood and suicidal ideas. The patient is not nervous/anxious.     Per HPI unless specifically indicated above     Objective:    BP 116/72 (BP Location: Left Arm, Patient Position: Sitting, Cuff Size: Normal)   Pulse 85   Temp 98.1 F (36.7 C) (Other (Comment))   Ht 5\' 2"  (1.575 m)   Wt 167 lb 9.6 oz (76 kg)   SpO2 98%   BMI 30.65 kg/m   Wt Readings from Last 3 Encounters:  10/29/15 167 lb 9.6 oz (76 kg)  10/23/15 164 lb 9.6 oz (74.7 kg)  09/29/15 167 lb 6.4 oz (75.9 kg)    Physical Exam  Constitutional: She is oriented to person, place, and time. She appears  well-developed and well-nourished.  HENT:  Head: Normocephalic and atraumatic.  Neck: Neck supple.  Cardiovascular: Normal rate and regular rhythm.   Pulmonary/Chest: Effort normal and breath sounds normal.  Abdominal: Soft. Bowel sounds are normal. She exhibits no mass. There is no hepatosplenomegaly. There is no tenderness.  Musculoskeletal: She exhibits no edema.  Lymphadenopathy:    She has no cervical adenopathy.  Neurological: She is alert and oriented to person, place, and time.  Skin: Skin is warm and dry.  Psychiatric: She has a normal mood and affect. Her behavior is normal.  Vitals reviewed.       Assessment & Plan:   Encounter Diagnoses  Name Primary?  Marland Kitchen Anemia, unspecified type Yes  . Gastroesophageal reflux disease, esophagitis presence not specified     -Check hemoglobin.  If good will cut back iron to qod -F/u 3 mo. RTO sooner prn

## 2015-11-07 ENCOUNTER — Encounter (HOSPITAL_COMMUNITY): Payer: Self-pay

## 2015-11-07 ENCOUNTER — Encounter (HOSPITAL_COMMUNITY)
Admission: RE | Disposition: A | Discharge status: Home / Self Care | Payer: Self-pay | Source: Ambulatory Visit | Attending: Internal Medicine

## 2015-11-07 ENCOUNTER — Ambulatory Visit (HOSPITAL_COMMUNITY): Admit: 2015-11-07 | Payer: Self-pay | Admitting: Gastroenterology

## 2015-11-07 ENCOUNTER — Ambulatory Visit (HOSPITAL_COMMUNITY)
Admission: RE | Admit: 2015-11-07 | Discharge: 2015-11-07 | Disposition: A | Discharge status: Home / Self Care | Payer: Self-pay | Source: Ambulatory Visit | Attending: Internal Medicine | Admitting: Internal Medicine

## 2015-11-07 DIAGNOSIS — N920 Excessive and frequent menstruation with regular cycle: Secondary | ICD-10-CM | POA: Insufficient documentation

## 2015-11-07 DIAGNOSIS — R1310 Dysphagia, unspecified: Secondary | ICD-10-CM | POA: Insufficient documentation

## 2015-11-07 DIAGNOSIS — K2950 Unspecified chronic gastritis without bleeding: Secondary | ICD-10-CM | POA: Insufficient documentation

## 2015-11-07 DIAGNOSIS — J029 Acute pharyngitis, unspecified: Secondary | ICD-10-CM | POA: Insufficient documentation

## 2015-11-07 DIAGNOSIS — D649 Anemia, unspecified: Secondary | ICD-10-CM | POA: Insufficient documentation

## 2015-11-07 DIAGNOSIS — K219 Gastro-esophageal reflux disease without esophagitis: Secondary | ICD-10-CM | POA: Insufficient documentation

## 2015-11-07 DIAGNOSIS — K3189 Other diseases of stomach and duodenum: Secondary | ICD-10-CM

## 2015-11-07 HISTORY — PX: ESOPHAGOGASTRODUODENOSCOPY: SHX5428

## 2015-11-07 SURGERY — EGD (ESOPHAGOGASTRODUODENOSCOPY)
Anesthesia: Moderate Sedation

## 2015-11-07 MED ORDER — SODIUM CHLORIDE 0.9 % IV SOLN
INTRAVENOUS | Status: DC
  Administered 2015-11-07: 08:00:00 via INTRAVENOUS

## 2015-11-07 MED ORDER — STERILE WATER FOR IRRIGATION IR SOLN
Status: DC | PRN
  Administered 2015-11-07: 08:00:00

## 2015-11-07 MED ORDER — MEPERIDINE HCL 100 MG/ML IJ SOLN
INTRAMUSCULAR | Status: DC | PRN
  Administered 2015-11-07: 25 mg via INTRAVENOUS
  Administered 2015-11-07: 50 mg via INTRAVENOUS

## 2015-11-07 MED ORDER — ONDANSETRON HCL 4 MG/2ML IJ SOLN
INTRAMUSCULAR | Status: AC
  Filled 2015-11-07: qty 2

## 2015-11-07 MED ORDER — ONDANSETRON HCL 4 MG/2ML IJ SOLN
INTRAMUSCULAR | Status: DC | PRN
  Administered 2015-11-07: 4 mg via INTRAVENOUS

## 2015-11-07 MED ORDER — MIDAZOLAM HCL 5 MG/5ML IJ SOLN
INTRAMUSCULAR | Status: AC
  Filled 2015-11-07: qty 10

## 2015-11-07 MED ORDER — MIDAZOLAM HCL 5 MG/5ML IJ SOLN
INTRAMUSCULAR | Status: DC | PRN
  Administered 2015-11-07 (×2): 2 mg via INTRAVENOUS

## 2015-11-07 MED ORDER — LIDOCAINE VISCOUS 2 % MT SOLN
OROMUCOSAL | Status: DC | PRN
  Administered 2015-11-07: 1 via OROMUCOSAL

## 2015-11-07 MED ORDER — LIDOCAINE VISCOUS 2 % MT SOLN
OROMUCOSAL | Status: DC
Start: 2015-11-07 — End: 2015-11-07
  Filled 2015-11-07: qty 15

## 2015-11-07 MED ORDER — MEPERIDINE HCL 100 MG/ML IJ SOLN
INTRAMUSCULAR | Status: DC
Start: 2015-11-07 — End: 2015-11-07
  Filled 2015-11-07: qty 2

## 2015-11-07 NOTE — Discharge Instructions (Addendum)
EGD Discharge instructions Please read the instructions outlined below and refer to this sheet in the next few weeks. These discharge instructions provide you with general information on caring for yourself after you leave the hospital. Your doctor may also give you specific instructions. While your treatment has been planned according to the most current medical practices available, unavoidable complications occasionally occur. If you have any problems or questions after discharge, please call your doctor. ACTIVITY  You may resume your regular activity but move at a slower pace for the next 24 hours.   Take frequent rest periods for the next 24 hours.   Walking will help expel (get rid of) the air and reduce the bloated feeling in your abdomen.   No driving for 24 hours (because of the anesthesia (medicine) used during the test).   You may shower.   Do not sign any important legal documents or operate any machinery for 24 hours (because of the anesthesia used during the test).  NUTRITION  Drink plenty of fluids.   You may resume your normal diet.   Begin with a light meal and progress to your normal diet.   Avoid alcoholic beverages for 24 hours or as instructed by your caregiver.  MEDICATIONS  You may resume your normal medications unless your caregiver tells you otherwise.  WHAT YOU CAN EXPECT TODAY  You may experience abdominal discomfort such as a feeling of fullness or gas pains.  FOLLOW-UP  Your doctor will discuss the results of your test with you.  SEEK IMMEDIATE MEDICAL ATTENTION IF ANY OF THE FOLLOWING OCCUR:  Excessive nausea (feeling sick to your stomach) and/or vomiting.   Severe abdominal pain and distention (swelling).   Trouble swallowing.   Temperature over 101 F (37.8 C).   Rectal bleeding or vomiting of blood.    Further recommendations to follow pending review of pathology report   Esofagogastroduodenoscopia: cuidados  posteriores (Esophagogastroduodenoscopy, Care After) Siga estas instrucciones durante las prximas semanas. Estas indicaciones le proporcionan informacin acerca de cmo deber cuidarse despus del procedimiento. El mdico tambin podr darle instrucciones ms especficas. El tratamiento ha sido planificado segn las prcticas mdicas actuales, pero en algunos casos pueden ocurrir problemas. Comunquese con el mdico si tiene algn problema o tiene dudas despus del procedimiento.  QU ESPERAR DESPUS DEL PROCEDIMIENTO  Despus del procedimiento, es tpico tener las siguientes sensaciones:   Public relations account executive.  Dolor al tragar.  Ganas de vomitar (nuseas).  Hinchazn.  Mareos.  Cansancio. INSTRUCCIONES PARA EL CUIDADO EN EL HOGAR  No coma ni beba nada hasta que el efecto del anestsico (anestesia local) haya desaparecido y vuelva a tener reflejo farngeo. Si puede tragar con comodidad, significa que el efecto de la anestesia local ha desaparecido.  No conduzca ni opere maquinarias hasta que el mdico lo autorice.  Tome los medicamentos solamente como se lo haya indicado el mdico. SOLICITE ATENCIN MDICA SI:   No puede dejar de toser.  No orina u orina menos de lo habitual. SOLICITE ATENCIN MDICA DE INMEDIATO SI:  Tiene dificultad para tragar.  No puede comer o beber.  El dolor de garganta o pecho empeora.  Est mareado, tiene una sensacin de desvanecimiento o se desmaya.  Tiene nuseas o vmitos.  Tiene escalofros.  Tiene fiebre.  Siente un dolor abdominal intenso.  La materia fecal es negra, de aspecto alquitranado o sanguinolenta.   Esta informacin no tiene Marine scientist el consejo del mdico. Asegrese de hacerle al mdico cualquier pregunta que  tenga.   Document Released: 04/24/2012 Document Revised: 01/18/2014 Elsevier Interactive Patient Education [employer].

## 2015-11-07 NOTE — H&P (View-Only) (Signed)
Primary Care Physician:  Soyla Dryer, PA-C Primary Gastroenterologist:  Dr. Gala Romney  Chief Complaint  Patient presents with  . Dysphagia    x1 year  . Gastroesophageal Reflux    HPI:   Heather Strickland is a 40 y.o. female who presents On referral from primary care for evaluation of GERD and dysphagia. PCP notes reviewed. The patient was seen 06/19/2015 for odynophagia. She was given a prescription for Protonix. She was reevaluated 07/21/2015 and indicated improving symptoms on Protonix. H. pylori breath test was noted to be positive and she was started on a regimen of Amoxil, clarithromycin, and Protonix. Most recent visit on 09/29/2015 noted improved stomach symptoms after treatment of H. pylori but noted some dysphagia symptoms. She was referred to GI. It is also noted she is intermittently anemic but is seen for menorrhagia and has been on po iron.  Patient initially had daughter-in-law on the phone to translate. She was told we need to use an Research officer, political party and the translator line was contacted. Also, her demographics were updated by the staff to list primary language as Spanish and "translator needed" changed to "yes."   Today she states her stomach isn't better. She has had throat bleeding. Heartburn symptoms initially improved but is having esophageal burning. Denies dysphagia and odynophagia. Burning is worse after eating, worse with spicy foods or chili. When asked to point to where it hurts she rubs her upper-esophageal area.  Protonix has helped. Denies abdominal pain. Has nausea which is worse after eating, no vomiting. Denies hematochezia, melena. Stools are dark, is taking iron supplement. Denies chest pain, dyspnea, dizziness, lightheadedness, syncope, near syncope. Denies any other upper or lower GI symptoms.  Past Medical History:  Diagnosis Date  . Anemia   . GERD (gastroesophageal reflux disease)     Past Surgical History:  Procedure Laterality Date  .  CESAREAN SECTION      Current Outpatient Prescriptions  Medication Sig Dispense Refill  . levonorgestrel (MIRENA) 20 MCG/24HR IUD 1 each by Intrauterine route once.    . pantoprazole (PROTONIX) 40 MG tablet Take 1 tablet (40 mg total) by mouth daily. Tome una tableta por boca diaria 30 tablet 1  . ferrous sulfate 325 (65 FE) MG tablet Take 1 tablet (325 mg total) by mouth daily. (Patient not taking: Reported on 10/23/2015) 30 tablet 0   No current facility-administered medications for this visit.     Allergies as of 10/23/2015  . (No Known Allergies)    Family History  Problem Relation Age of Onset  . Diabetes Mother   . Hyperlipidemia Mother     Social History   Social History  . Marital status: Single    Spouse name: N/A  . Number of children: N/A  . Years of education: N/A   Occupational History  . Not on file.   Social History Main Topics  . Smoking status: Never Smoker  . Smokeless tobacco: Never Used  . Alcohol use No  . Drug use: No  . Sexual activity: Not Currently    Birth control/ protection: None, IUD   Other Topics Concern  . Not on file   Social History Narrative  . No narrative on file    Review of Systems: General: Negative for anorexia, weight loss, fever, chills, fatigue, weakness. Eyes: Negative for vision changes.  ENT: Negative for hoarseness, difficulty swallowing , nasal congestion. CV: Negative for chest pain, angina, palpitations, dyspnea on exertion, peripheral edema.  Respiratory: Negative for dyspnea at rest,  dyspnea on exertion, cough, sputum, wheezing.  GI: See history of present illness. GU:  Negative for dysuria, hematuria, urinary incontinence, urinary frequency, nocturnal urination.  MS: Negative for joint pain, low back pain.  Derm: Negative for rash or itching.  Neuro: Negative for weakness, abnormal sensation, seizure, frequent headaches, memory loss, confusion.  Psych: Negative for anxiety, depression, suicidal ideation,  hallucinations.  Endo: Negative for unusual weight change.  Heme: Negative for bruising or bleeding. Allergy: Negative for rash or hives.    Physical Exam: BP 128/81   Pulse 70   Temp 98 F (36.7 C) (Oral)   Ht 5\' 2"  (1.575 m)   Wt 164 lb 9.6 oz (74.7 kg)   BMI 30.11 kg/m  General:   Alert and oriented. Pleasant and cooperative. Well-nourished and well-developed.  Head:  Normocephalic and atraumatic. Eyes:  Without icterus, sclera clear and conjunctiva pink.  Ears:  Normal auditory acuity. Mouth:  No deformity or lesions, oral mucosa pink.  Throat/Neck:  Supple, without mass or thyromegaly. Cardiovascular:  S1, S2 present without murmurs appreciated. Normal pulses noted. Extremities without clubbing or edema. Respiratory:  Clear to auscultation bilaterally. No wheezes, rales, or rhonchi. No distress.  Gastrointestinal:  +BS, soft, non-tender and non-distended. No HSM noted. No guarding or rebound. No masses appreciated.  Rectal:  Deferred  Musculoskalatal:  Symmetrical without gross deformities. Normal posture. Skin:  Intact without significant lesions or rashes. Neurologic:  Alert and oriented x4;  grossly normal neurologically. Psych:  Alert and cooperative. Normal mood and affect. Heme/Lymph/Immune: No significant cervical adenopathy. No excessive bruising noted.    10/23/2015 8:19 AM   Disclaimer: This note was dictated with voice recognition software. Similar sounding words can inadvertently be transcribed and may not be corrected upon review.

## 2015-11-07 NOTE — Op Note (Signed)
[facility] Patient Name: Heather Strickland Procedure Date: 11/07/2015 8:17 AM MRN: [id number] Date of Birth: [birth year] Attending MD: Norvel Richards , MD CSN: [id number] Age: 40 Admit Type: Outpatient Procedure:                Upper GI endoscopy with biopsy Indications:              Sore throat; nausea; history of H. pylori infecton Providers:                Norvel Richards, MD, Renda Rolls, RN, Isabella Stalling, Technician Referring MD:              Medicines:                Midazolam 4 mg IV, Meperidine 75 mg IV Complications:            No immediate complications. Estimated Blood Loss:     Estimated blood loss was minimal. Procedure:                Pre-Anesthesia Assessment:                           - Prior to the procedure, a History and Physical                            was performed, and patient medications and                            allergies were reviewed. The patient's tolerance of                            previous anesthesia was also reviewed. The risks                            and benefits of the procedure and the sedation                            options and risks were discussed with the patient.                            All questions were answered, and informed consent                            was obtained. Prior Anticoagulants: The patient has                            taken no previous anticoagulant or antiplatelet                            agents. ASA Grade Assessment: II - A patient with                            mild systemic disease. After reviewing the risks  and benefits, the patient was deemed in                            satisfactory condition to undergo the procedure.                           After obtaining informed consent, the endoscope was                            passed under direct vision. Throughout the                            procedure, the patient's  blood pressure, pulse, and                            oxygen saturations were monitored continuously. The                            [id number]) scope was introduced through the                            mouth, and advanced to the second part of duodenum.                            The upper GI endoscopy was accomplished without                            difficulty. The patient tolerated the procedure                            well. Scope In: 8:32:09 AM Scope Out: 8:36:30 AM Total Procedure Duration: 0 hours 4 minutes 21 seconds  Findings:      The examined esophagus was normal.      Erythematous mucosa without bleeding was found in the stomach. No ulcer       or infiltrating process; Patent pylorus. Normal first and second portion       of the duodenum, This was biopsied with a cold forceps for histology.       Estimated blood loss was minimal. Impression:               - Normal esophagus.                           - Erythematous gastric mucosa - biopsied Moderate Sedation:      Moderate (conscious) sedation was administered by the endoscopy nurse       and supervised by the endoscopist. The following parameters were       monitored: oxygen saturation, heart rate, blood pressure, respiratory       rate, EKG, adequacy of pulmonary ventilation, and response to care.       Total physician intraservice time was 11 minutes. Recommendation:           - Patient has a contact number available for                            emergencies.  The signs and symptoms of potential                            delayed complications were discussed with the                            patient. Return to normal activities tomorrow.                            Written discharge instructions were provided to the                            patient.                           - Resume previous diet.                           - Continue present medications.                           - No repeat upper  endoscopy.                           - Return to GI office (date not yet determined).                            Further recommendations to follow pending review of                            pathology report Procedure Code(s):        --- Professional ---                           [id number], Esophagogastroduodenoscopy, flexible,                            transoral; with biopsy, single or multiple                           99152, Moderate sedation services provided by the                            same physician or other qualified health care                            professional performing the diagnostic or                            therapeutic service that the sedation supports,                            requiring the presence of an independent trained                            observer to assist in the monitoring of the  patient's level of consciousness and physiological                            status; initial 15 minutes of intraservice time,                            patient age 42 years or older Diagnosis Code(s):        --- Professional ---                           K31.89, Other diseases of stomach and duodenum                           J02.9, Acute pharyngitis, unspecified CPT copyright 2016 American Medical Association. All rights reserved. The codes documented in this report are preliminary and upon coder review may  be revised to meet current compliance requirements. Cristopher Estimable. Lima Chillemi, MD Norvel Richards, MD 11/07/2015 1:43:42 PM This report has been signed electronically. Number of Addenda: 0

## 2015-11-07 NOTE — Interval H&P Note (Signed)
History and Physical Interval Note:  11/07/2015 8:21 AM  Heather Strickland  has presented today for surgery, with the diagnosis of GERD  The various methods of treatment have been discussed with the patient and family. After consideration of risks, benefits and other options for treatment, the patient has consented to  Procedure(s) with comments: ESOPHAGOGASTRODUODENOSCOPY (EGD) (N/A) - 815 as a surgical intervention .  The patient's history has been reviewed, patient examined, no change in status, stable for surgery.  I have reviewed the patient's chart and labs.  Questions were answered to the patient's satisfaction.      Via interpreter: Patient denies dysphagia. Some nausea and reflux symptoms.  The risks, benefits, limitations, alternatives and imponderables have been reviewed with the patient. Potential for esophageal dilation, biopsy, etc. have also been reviewed.  Questions have been answered. All parties agreeable. Manus Rudd

## 2015-11-10 ENCOUNTER — Encounter: Payer: Self-pay | Admitting: Internal Medicine

## 2015-11-11 ENCOUNTER — Encounter: Payer: Self-pay | Admitting: Internal Medicine

## 2015-11-11 ENCOUNTER — Encounter (HOSPITAL_COMMUNITY): Payer: Self-pay | Admitting: Internal Medicine

## 2015-12-22 ENCOUNTER — Other Ambulatory Visit: Payer: Self-pay

## 2015-12-22 ENCOUNTER — Encounter: Payer: Self-pay | Admitting: Gastroenterology

## 2015-12-22 ENCOUNTER — Telehealth: Payer: Self-pay

## 2015-12-22 ENCOUNTER — Ambulatory Visit (INDEPENDENT_AMBULATORY_CARE_PROVIDER_SITE_OTHER): Payer: Self-pay | Admitting: Gastroenterology

## 2015-12-22 ENCOUNTER — Ambulatory Visit: Payer: Self-pay | Admitting: Nurse Practitioner

## 2015-12-22 DIAGNOSIS — R070 Pain in throat: Secondary | ICD-10-CM

## 2015-12-22 DIAGNOSIS — K219 Gastro-esophageal reflux disease without esophagitis: Secondary | ICD-10-CM | POA: Insufficient documentation

## 2015-12-22 DIAGNOSIS — R042 Hemoptysis: Secondary | ICD-10-CM

## 2015-12-22 MED ORDER — PANTOPRAZOLE SODIUM 40 MG PO TBEC: 40.0000 mg | DELAYED_RELEASE_TABLET | Freq: Two times a day (BID) | ORAL | 3 refills | Status: DC

## 2015-12-22 NOTE — Patient Instructions (Addendum)
I have increased Protonix to twice a day, 30 minutes before breakfast and dinner.   We have also referred you to an Ear, Nose, Throat Specialist.   We will see you in 3 months.    Aument Protonix a [employer], 30 minutos antes del desayuno y Secondary school teacher.  Tambin la hemos Referido a Teaching laboratory technician en [place], Lawyer y [place].  Te veremos en 3 meses

## 2015-12-22 NOTE — Telephone Encounter (Signed)
Called and informed pt's friend Joellen Jersey- speaks [place]) of ENT appt with Dr. Benjamine Mola 01/15/16 at 4:00pm.

## 2015-12-22 NOTE — Progress Notes (Signed)
    Referring Provider: Soyla Dryer, PA-C Primary Care Physician:  Soyla Dryer, PA-C  Primary GI: Dr. Gala Romney   Chief Complaint  Patient presents with  . Gastroesophageal Reflux    HPI:   Heather Strickland is a 40 y.o. female presenting today with a history of GERD and dysphagia. EGD completed with normal esophagus, chronic gastritis. Previously treated for H.pylori in July 2017.   Spitting up blood at times, thick spit. Dark at times. Denies any coughing. Feels like there is something in her neck. Feels like something is hitting on that left side. No odynophagia. Sometimes has discomfort in throat when swallowing. Feels a little stressed. Sour taste in mouth. Protonix once daily, but she is concerned regarding blood. Feels a sour, nasty taste in mouth. States she is spitting it out. No abdominal pain. Feels nauseated. No vomiting. Feels discomfort in nose and in forehead.   Past Medical History:  Diagnosis Date  . Anemia   . GERD (gastroesophageal reflux disease)     Past Surgical History:  Procedure Laterality Date  . CESAREAN SECTION    . ESOPHAGOGASTRODUODENOSCOPY N/A 11/07/2015   Dr. Gala Romney: normal esophagus, chronic gastritis     Current Outpatient Prescriptions  Medication Sig Dispense Refill  . ferrous sulfate 325 (65 FE) MG tablet Take 1 tablet (325 mg total) by mouth daily. 30 tablet 0  . levonorgestrel (MIRENA) 20 MCG/24HR IUD 1 each by Intrauterine route once.    . pantoprazole (PROTONIX) 40 MG tablet Take 1 tablet (40 mg total) by mouth daily. Tome una tableta por boca diaria 30 tablet 1   No current facility-administered medications for this visit.     Allergies as of 12/22/2015  . (No Known Allergies)    Family History  Problem Relation Age of Onset  . Diabetes Mother   . Hyperlipidemia Mother     Social History   Social History  . Marital status: Single    Spouse name: N/A  . Number of children: N/A  . Years of education: N/A    Social History Main Topics  . Smoking status: Never Smoker  . Smokeless tobacco: Never Used  . Alcohol use No  . Drug use: No  . Sexual activity: Not Currently    Birth control/ protection: None, IUD   Other Topics Concern  . Not on file   Social History Narrative  . No narrative on file    Review of Systems: As mentioned in HPI   Physical Exam: BP 124/73   Pulse 83   Temp 98.3 F (36.8 C) (Oral)   Ht 5\' 5"  (1.651 m)   Wt 163 lb 9.6 oz (74.2 kg)   BMI 27.22 kg/m  General:   Alert and oriented. No distress noted. Pleasant and cooperative.  Head:  Normocephalic and atraumatic. Eyes:  Conjuctiva clear without scleral icterus. Mouth:  Oral mucosa pink and moist. Good dentition. No lesions. Neck:  Supple, right-sided neck slightly fuller than left but without overt lymphadenopathy  Heart:  S1, S2 present without murmurs, rubs, or gallops. Regular rate and rhythm. Abdomen:  +BS, soft, non-tender and non-distended. No rebound or guarding. No HSM or masses noted. Msk:  Symmetrical without gross deformities. Normal posture. Extremities:  Without edema. Neurologic:  Alert and  oriented x4;  grossly normal neurologically. Psych:  Alert and cooperative. Normal mood and affect.

## 2015-12-24 NOTE — Assessment & Plan Note (Signed)
40 year old Hispanic female, requiring interpreter, presenting with reports of persistent GERD, discomfort in throat, right-sided neck discomfort with swallowing, spitting up blood. Difficult to discern if this is hematemesis or hemoptysis; she has no respiratory symptoms. Notable sinus discomfort per her report. Recent EGD on file with normal esophagus and chronic gastritis. With her constellation of symptoms, I would like to refer for ENT evaluation. In interim, increase Protonix to BID. Return in 3 months.

## 2015-12-24 NOTE — Progress Notes (Signed)
CC'ED TO PCP 

## 2015-12-29 ENCOUNTER — Telehealth: Payer: Self-pay

## 2015-12-29 DIAGNOSIS — R1310 Dysphagia, unspecified: Secondary | ICD-10-CM

## 2015-12-29 NOTE — Telephone Encounter (Signed)
She is aware 

## 2015-12-29 NOTE — Telephone Encounter (Signed)
Pt is set up for BPE on 01/01/16 @ 8:30 am

## 2015-12-29 NOTE — Telephone Encounter (Signed)
Pt called to see if we could get her into see Dr.Teoh sooner the 01/15/16. She feels like she is getting worst. Please advise

## 2015-12-29 NOTE — Telephone Encounter (Signed)
I'm sorry to hear that! We can order a BPE in the interim, see if there is anything we can be doing differently in the meantime.

## 2016-01-01 ENCOUNTER — Ambulatory Visit (HOSPITAL_COMMUNITY)
Admission: RE | Admit: 2016-01-01 | Discharge: 2016-01-01 | Disposition: A | Discharge status: Home / Self Care | Payer: Self-pay | Source: Ambulatory Visit | Attending: Gastroenterology | Admitting: Gastroenterology

## 2016-01-01 DIAGNOSIS — R1310 Dysphagia, unspecified: Secondary | ICD-10-CM | POA: Insufficient documentation

## 2016-01-02 NOTE — Progress Notes (Signed)
BPE is normal. Proceed with plans to see ENT.

## 2016-01-07 NOTE — Progress Notes (Signed)
Pt is aware and has appt with ENT in Jan. 2018.

## 2016-01-15 ENCOUNTER — Ambulatory Visit (INDEPENDENT_AMBULATORY_CARE_PROVIDER_SITE_OTHER): Payer: Self-pay | Admitting: Otolaryngology

## 2016-01-15 DIAGNOSIS — J351 Hypertrophy of tonsils: Secondary | ICD-10-CM

## 2016-01-15 DIAGNOSIS — R070 Pain in throat: Secondary | ICD-10-CM

## 2016-01-15 DIAGNOSIS — D3705 Neoplasm of uncertain behavior of pharynx: Secondary | ICD-10-CM

## 2016-01-27 ENCOUNTER — Encounter (HOSPITAL_COMMUNITY): Payer: Self-pay | Admitting: Oncology

## 2016-01-27 ENCOUNTER — Encounter (HOSPITAL_COMMUNITY): Payer: Self-pay | Attending: Oncology | Admitting: Oncology

## 2016-01-27 ENCOUNTER — Ambulatory Visit (HOSPITAL_COMMUNITY): Payer: Self-pay | Admitting: Oncology

## 2016-01-27 VITALS — BP 145/86 | HR 85 | Temp 98.9°F | Resp 16 | Ht 65.0 in | Wt 162.5 lb

## 2016-01-27 DIAGNOSIS — C090 Malignant neoplasm of tonsillar fossa: Secondary | ICD-10-CM | POA: Insufficient documentation

## 2016-01-27 DIAGNOSIS — C099 Malignant neoplasm of tonsil, unspecified: Secondary | ICD-10-CM | POA: Insufficient documentation

## 2016-01-27 HISTORY — DX: Malignant neoplasm of tonsil, unspecified: C09.9

## 2016-01-27 LAB — CBC WITH DIFFERENTIAL/PLATELET
BASOS PCT: 0 %
Basophils Absolute: 0 10*3/uL (ref 0.0–0.1)
EOS ABS: 0.1 10*3/uL (ref 0.0–0.7)
Eosinophils Relative: 2 %
HCT: 37.4 % (ref 36.0–46.0)
HEMOGLOBIN: 12.9 g/dL (ref 12.0–15.0)
LYMPHS ABS: 2.1 10*3/uL (ref 0.7–4.0)
Lymphocytes Relative: 28 %
MCH: 31.2 pg (ref 26.0–34.0)
MCHC: 34.5 g/dL (ref 30.0–36.0)
MCV: 90.6 fL (ref 78.0–100.0)
Monocytes Absolute: 0.4 10*3/uL (ref 0.1–1.0)
Monocytes Relative: 6 %
NEUTROS PCT: 64 %
Neutro Abs: 4.6 10*3/uL (ref 1.7–7.7)
Platelets: 290 10*3/uL (ref 150–400)
RBC: 4.13 MIL/uL (ref 3.87–5.11)
RDW: 13 % (ref 11.5–15.5)
WBC: 7.2 10*3/uL (ref 4.0–10.5)

## 2016-01-27 LAB — COMPREHENSIVE METABOLIC PANEL
ALK PHOS: 60 U/L (ref 38–126)
ALT: 15 U/L (ref 14–54)
AST: 17 U/L (ref 15–41)
Albumin: 4.5 g/dL (ref 3.5–5.0)
Anion gap: 6 (ref 5–15)
BUN: 14 mg/dL (ref 6–20)
CALCIUM: 9.4 mg/dL (ref 8.9–10.3)
CO2: 28 mmol/L (ref 22–32)
Chloride: 101 mmol/L (ref 101–111)
Creatinine, Ser: 0.65 mg/dL (ref 0.44–1.00)
GFR calc non Af Amer: >60 mL/min (ref >60)
Glucose, Bld: 101 mg/dL — ABNORMAL HIGH (ref 65–99)
POTASSIUM: 3.8 mmol/L (ref 3.5–5.1)
SODIUM: 135 mmol/L (ref 135–145)
TOTAL PROTEIN: 7.9 g/dL (ref 6.5–8.1)
Total Bilirubin: 0.6 mg/dL (ref 0.3–1.2)

## 2016-01-27 NOTE — Assessment & Plan Note (Addendum)
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 Benjamine Mola on 01/15/2016.  Labs today: CBC diff, CMET.  I personally reviewed and went over laboratory results with the patient.  The results are noted within this dictation.  I personally reviewed and went over pathology results with the patient.  I will try to get HPV testing results.  CT neck and chest w contrast to complete staging.  If Stage III-IV disease is noted, then PET imaging can be considered.  She will return following imaging to review stage of cancer and further medical oncology recommendations.

## 2016-01-27 NOTE — Progress Notes (Signed)
[facility] Hematology/Oncology Consultation   Name: Heather Strickland      MRN: [id number]    Location: Room/bed info not found  Date: 01/27/2016 Time:6:10 PM   REFERRING PHYSICIAN:  Dr. 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  HISTORY OF PRESENT ILLNESS:   Heather Strickland is a 41 y.o. female with a medical history significant for GERD, H/O H. Pylori, obesity, and pre-diabetes who is referred to the [facility] for poorly differentiated squamous cell carcinoma of left tonsil.    Squamous cell carcinoma of left tonsil ([place])   01/15/2016 Procedure    Left tonsil biopsy by Dr. Benjamine Mola      01/19/2016 Pathology Results    Invasive squamous cell carcinoma, poorly differentiated.      She presented to Dr. Benjamine Mola after being referred by [name], NP (GI) with a 5-6 month history of severe sore throat.  The patient never sought treatment for this issue previously.  Over the last 3 months, patient reported intermittent bleeding from left tonsil.  She also feels a hard mass along the left side of neck.  On physical exam by Dr. Benjamine Mola, he noted 3+ ulceration and erythema on the left tonsil.  On flexible laryngoscopy, tonsillar hypertrophy with ulceration was noted on the left.    She denies any odynophagia or dysphagia.  She reports a good appetite.  She denies any weight loss.  When swallowing, she can feel her left sided tonsillar mass.  She otherwise denies any complaints.  She is an illegal immigrant.  She has applied for [facility].  We discussed that based upon her staging imaging, medical oncology recommendations will follow.    Review of Systems  Constitutional: Negative.  Negative for chills, fever and weight loss.  HENT: Negative.        Sensation of left oral mass.  Clearing blood at times.  Eyes: Negative.   Respiratory: Negative.  Negative for  cough.   Cardiovascular: Negative.  Negative for chest pain.  Gastrointestinal: Negative.  Negative for constipation, diarrhea, nausea and vomiting.  Genitourinary: Negative.  Negative for dysuria.  Musculoskeletal: Negative.   Skin: Negative.   Neurological: Negative.  Negative for weakness.  Endo/Heme/Allergies: Negative.   Psychiatric/Behavioral: Negative.    PAST MEDICAL HISTORY:   Past Medical History:  Diagnosis Date  . Anemia   . GERD (gastroesophageal reflux disease)   . Squamous cell carcinoma of left tonsil ([place]) 01/27/2016    ALLERGIES: No Known Allergies    MEDICATIONS: I have reviewed the patient's current medications.    Current Outpatient Prescriptions on File Prior to Visit  Medication Sig Dispense Refill  . ferrous sulfate 325 (65 FE) MG tablet Take 1 tablet (325 mg total) by mouth daily. 30 tablet 0  . levonorgestrel (MIRENA) 20 MCG/24HR IUD 1 each by Intrauterine route once.    . pantoprazole (PROTONIX) 40 MG tablet Take 1 tablet (40 mg total) by mouth 2 (two) times daily before a meal. Tome una tableta por boca diaria 60 tablet 3   No current facility-administered medications on file prior to visit.      PAST SURGICAL HISTORY Past Surgical History:  Procedure Laterality Date  . CESAREAN SECTION  [id number]  . ESOPHAGOGASTRODUODENOSCOPY N/A 11/07/2015   Dr. Gala Romney: normal esophagus, chronic gastritis     FAMILY HISTORY: Family History  Problem Relation Age of Onset  .  Diabetes Mother   . Hypertension Mother   . Prostatitis Father   . ADD / ADHD Daughter   . Cerebral palsy Son     SOCIAL HISTORY:  reports that she has never smoked. She has never used smokeless tobacco. She reports that she does not drink alcohol or use drugs.  Social History   Social History  . Marital status: Single    Spouse name: N/A  . Number of children: N/A  . Years of education: N/A   Social History Main Topics  . Smoking status: Never Smoker  . Smokeless tobacco:  Never Used  . Alcohol use No  . Drug use: No  . Sexual activity: Not Currently    Birth control/ protection: None, IUD   Other Topics Concern  . None   Social History Narrative  . None    PERFORMANCE STATUS: The patient's performance status is 0 - Asymptomatic  PHYSICAL EXAM: Most Recent Vital Signs: Blood pressure (!) 145/86, pulse 85, temperature 98.9 F (37.2 C), temperature source Oral, resp. rate 16, height 5\' 5"  (1.651 m), weight 162 lb 8 oz (73.7 kg), SpO2 100 %. General appearance: alert, cooperative, appears stated age, no distress, mildly obese and accompanied by daughter-in-law who interprets  Head: Normocephalic, without obvious abnormality, atraumatic Eyes: negative findings: lids and lashes normal, conjunctivae and sclerae normal and corneas clear Throat: lips, mucosa, and tongue normal; teeth and gums normal and left tonsillar erythema Neck: no adenopathy, supple, symmetrical, trachea midline and thyroid not enlarged, symmetric, no tenderness/mass/nodules Lungs: clear to auscultation bilaterally and normal percussion bilaterally Heart: regular rate and rhythm, S1, S2 normal, no murmur, click, rub or gallop Abdomen: soft, non-tender; bowel sounds normal; no masses,  no organomegaly Extremities: extremities normal, atraumatic, no cyanosis or edema Skin: Skin color, texture, turgor normal. No rashes or lesions Lymph nodes: Cervical, supraclavicular, and axillary nodes normal. Neurologic: Alert and oriented X 3, normal strength and tone. Normal symmetric reflexes. Normal coordination and gait  LABORATORY DATA:  Results for orders placed or performed in visit on 01/27/16 (from the past 48 hour(s))  CBC with Differential     Status: None   Collection Time: 01/27/16  5:39 PM  Result Value Ref Range   WBC 7.2 4.0 - 10.5 K/uL   RBC 4.13 3.87 - 5.11 MIL/uL   Hemoglobin 12.9 12.0 - 15.0 g/dL   HCT 37.4 36.0 - 46.0 %   MCV 90.6 78.0 - 100.0 fL   MCH 31.2 26.0 - 34.0 pg    MCHC 34.5 30.0 - 36.0 g/dL   RDW 13.0 11.5 - 15.5 %   Platelets 290 150 - 400 K/uL   Neutrophils Relative % 64 %   Neutro Abs 4.6 1.7 - 7.7 K/uL   Lymphocytes Relative 28 %   Lymphs Abs 2.1 0.7 - 4.0 K/uL   Monocytes Relative 6 %   Monocytes Absolute 0.4 0.1 - 1.0 K/uL   Eosinophils Relative 2 %   Eosinophils Absolute 0.1 0.0 - 0.7 K/uL   Basophils Relative 0 %   Basophils Absolute 0.0 0.0 - 0.1 K/uL      RADIOGRAPHY: No results found.     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 Benjamine Mola on 01/15/2016.  Labs today: CBC diff, CMET.  I personally reviewed and went over laboratory results with the patient.  The results are noted within this  dictation.  I personally reviewed and went over pathology results with the patient.  I will try to get HPV testing results.  CT neck and chest w contrast to complete staging.  If Stage III-IV disease is noted, then PET imaging can be considered.  She will return following imaging to review stage of cancer and further medical oncology recommendations.    ORDERS PLACED FOR THIS ENCOUNTER: Orders Placed This Encounter  Procedures  . CT Chest W Contrast  . CT SOFT TISSUE NECK W CONTRAST  . CBC with Differential  . Comprehensive metabolic panel    MEDICATIONS PRESCRIBED THIS ENCOUNTER: No orders of the defined types were placed in this encounter.   All questions were answered. The patient knows to call the clinic with any problems, questions or concerns. We can certainly see the patient much sooner if necessary.  Patient discussed with Dr. Whitney Muse and together we ascertained an up-to-date interval history, and examined the patient.  Dr. Whitney Muse developed the patient's assessment and plan.  This was a shared visit-consultation.  Her attestation will follow below.  This note is electronically signed by: Doy Mince 01/27/2016 6:10 PM

## 2016-01-27 NOTE — Patient Instructions (Addendum)
[place] at [facility] Discharge Instructions  RECOMMENDATIONS MADE BY THE CONSULTANT AND ANY TEST RESULTS WILL BE SENT TO YOUR REFERRING PHYSICIAN.  You were seen today by Kirby Crigler PA-C. CT neck and chest with contrast. Labs today will call with results. Follow up in 1 week.   Thank you for choosing [place] at [facility] to provide your oncology and hematology care.  To afford each patient quality time with our provider, please arrive at least 15 minutes before your scheduled appointment time.    If you have a lab appointment with the [place] please come in thru the  Main Entrance and check in at the main information desk  You need to re-schedule your appointment should you arrive 10 or more minutes late.  We strive to give you quality time with our providers, and arriving late affects you and other patients whose appointments are after yours.  Also, if you no show three or more times for appointments you may be dismissed from the clinic at the providers discretion.     Again, thank you for choosing [facility].  Our hope is that these requests will decrease the amount of time that you wait before being seen by our physicians.       _____________________________________________________________  Should you have questions after your visit to [facility], please contact our office at [phone] between the hours of 8:30 a.m. and 4:30 p.m.  Voicemails left after 4:30 p.m. will not be returned until the following business day.  For prescription refill requests, have your pharmacy contact our office.       Resources For Cancer Patients and their Caregivers ? American Cancer Society: Can assist with transportation, wigs, general needs, runs Look Good Feel Better.        308-390-4890 ? Cancer Care: Provides financial assistance, online support groups, medication/co-pay assistance.  1-800-813-HOPE  270-231-9331) ? [facility] Assists [place] cancer patients and their families through emotional , educational and financial support.  619-738-8734 ? [place] DSS Where to apply for food stamps, Medicaid and utility assistance. (873)097-3925 ? [facility]: Transportation to medical appointments. 315-148-2680 ? Social Security Administration: May apply for disability if have a Stage IV cancer. 365-224-1798 (907) 723-6169 ? [employer], Disability and Transit Services: Assists with nutrition, care and transit needs. [place] Support Programs: @10RELATIVEDAYS @ > Cancer Support Group  2nd Tuesday of the month 1pm-2pm, Journey Room  > Creative Journey  3rd Tuesday of the month 1130am-1pm, Journey Room  > Look Good Feel Better  1st Wednesday of the month 10am-12 noon, Journey Room (Call [place] to register [phone])

## 2016-01-27 NOTE — Progress Notes (Signed)
Reviewed note received by Dr. Benjamine Mola. Laryngoscopy was completed and noted tonsillar hypertrophy with left ulceration. Biopsy completed. I do not have final path, but it appears she is seeing Kirby Crigler, PA with oncology today for tonsillar cancer.

## 2016-01-29 ENCOUNTER — Telehealth (HOSPITAL_COMMUNITY): Payer: Self-pay | Admitting: *Deleted

## 2016-01-29 ENCOUNTER — Ambulatory Visit (HOSPITAL_COMMUNITY)
Admission: RE | Admit: 2016-01-29 | Discharge: 2016-01-29 | Disposition: A | Discharge status: Home / Self Care | Payer: Self-pay | Source: Ambulatory Visit | Attending: Oncology | Admitting: Oncology

## 2016-01-29 DIAGNOSIS — C099 Malignant neoplasm of tonsil, unspecified: Secondary | ICD-10-CM | POA: Insufficient documentation

## 2016-01-29 DIAGNOSIS — R599 Enlarged lymph nodes, unspecified: Secondary | ICD-10-CM | POA: Insufficient documentation

## 2016-01-29 MED ORDER — IOPAMIDOL (ISOVUE-300) INJECTION 61%
100.0000 mL | Freq: Once | INTRAVENOUS | Status: AC | PRN
  Administered 2016-01-29: 100 mL via INTRAVENOUS

## 2016-01-29 NOTE — Telephone Encounter (Signed)
Pt aware of lab results and aware we are still waiting on results for HPV.

## 2016-01-30 ENCOUNTER — Encounter (HOSPITAL_BASED_OUTPATIENT_CLINIC_OR_DEPARTMENT_OTHER): Payer: Self-pay | Admitting: Hematology & Oncology

## 2016-01-30 ENCOUNTER — Encounter (HOSPITAL_COMMUNITY): Payer: Self-pay | Admitting: Hematology & Oncology

## 2016-01-30 ENCOUNTER — Encounter: Payer: Self-pay | Admitting: Radiation Oncology

## 2016-01-30 ENCOUNTER — Telehealth: Payer: Self-pay | Admitting: *Deleted

## 2016-01-30 VITALS — BP 121/75 | HR 94 | Temp 98.7°F | Resp 16 | Wt 163.8 lb

## 2016-01-30 DIAGNOSIS — B977 Papillomavirus as the cause of diseases classified elsewhere: Secondary | ICD-10-CM

## 2016-01-30 DIAGNOSIS — C099 Malignant neoplasm of tonsil, unspecified: Secondary | ICD-10-CM

## 2016-01-30 NOTE — Progress Notes (Signed)
[facility] Hematology/Oncology Progress Note  Name: Heather Strickland      MRN: [id number]    Location: Room/bed info not found  Date: 01/30/2016 Time:11:58 AM   REFERRING PHYSICIAN:  Dr. 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  HISTORY OF PRESENT ILLNESS:   Heather Strickland is a 41 y.o. female with a medical history significant for GERD, H/O H. 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    Squamous cell carcinoma of left tonsil ([place])   01/15/2016 Procedure    Left tonsil biopsy by Dr. Benjamine Mola      01/19/2016 Pathology Results    Invasive squamous cell carcinoma, poorly differentiated.      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 Fine soft tissue details lost at these levels.  Pharyngeal mass centered in the left palatini tonsil with surround mucosal thickening as described below probably representing neoplasm.  Mucosal thickening of the left aspect of hypopharynx and pharynx with inferior most extension to the margin of the supraglottic airway without appreciable invasion into epiglottis, aryepiglottic folds, vocal cords, or paraglottic fat.  Superior most extension of mucosal thickening to the left aspect of the uvula and along the left aspect of the soft palate to the hard/soft palate junction, this region is obscured by motion artifact.  Anteriorly there is effacement of left glossopharyngeal sulcus without appreciable tongue base invasion, this region is obscured by motion artifact.  Asymmetric enlargement of left-sided level 2 and 3 cervical lymph nodes possibly metastatic. No evidence for lymph node  necrosis or extra nodal extension.      01/29/2016 Imaging    CT chest-  No evidence of metastatic disease in the chest.      She presented to Dr. Benjamine Mola after being referred by [name], NP (GI) with a 5-6 month history of severe sore throat.  The patient never sought treatment for this issue previously.  Over the last 3 months, patient reported intermittent bleeding from left tonsil.  She also feels a hard mass along the left side of neck.  On physical exam by Dr. Benjamine Mola, he noted 3+ ulceration and erythema on the left tonsil.  On flexible laryngoscopy, tonsillar hypertrophy with ulceration was noted on the left.    Heather Strickland returns to the [place] today for follow up accompanied by her daughter-in-law. Her daughter-in-law facilitated our visit by translating for the patient. I personally reviewed and went over CT imaging results with the patient.  She complains of spots in her vision, they are fleeting. No headaches. No blurry vision or visual change.  She is nervous and anxious about her diagnosis.   Review of Systems  Constitutional: Negative.  Negative for chills, fever and weight loss.  HENT: Negative.        Sensation of left oral mass.  Clearing blood at times.  Eyes: Negative.        Spots in vision  Respiratory: Negative.  Negative for cough.   Cardiovascular: Negative.  Negative for chest pain.  Gastrointestinal: Negative.  Negative for constipation, diarrhea, nausea and vomiting.  Genitourinary: Negative.  Negative for dysuria.  Musculoskeletal: Negative.   Skin: Negative.   Neurological: Negative.  Negative for weakness.  Endo/Heme/Allergies: Negative.   Psychiatric/Behavioral: The patient is nervous/anxious.    PAST MEDICAL HISTORY:   Past Medical History:  Diagnosis Date  . Anemia   . GERD (gastroesophageal reflux disease)   . Squamous cell carcinoma of left tonsil ([place]) 01/27/2016    ALLERGIES: No Known Allergies    MEDICATIONS: I have reviewed the  patient's current medications.    Current Outpatient Prescriptions on File Prior to Visit  Medication Sig Dispense Refill  . ferrous sulfate 325 (65 FE) MG tablet Take 1 tablet (325 mg total) by mouth daily. 30 tablet 0  . levonorgestrel (MIRENA) 20 MCG/24HR IUD 1 each by Intrauterine route once.    . pantoprazole (PROTONIX) 40 MG tablet Take 1 tablet (40 mg total) by mouth 2 (two) times daily before a meal. Tome una tableta por boca diaria 60 tablet 3   No current facility-administered medications on file prior to visit.      PAST SURGICAL HISTORY Past Surgical History:  Procedure Laterality Date  . CESAREAN SECTION  [id number]  . ESOPHAGOGASTRODUODENOSCOPY N/A 11/07/2015   Dr. Gala Romney: normal esophagus, chronic gastritis     FAMILY HISTORY: Family History  Problem Relation Age of Onset  . Diabetes Mother   . Hypertension Mother   . Prostatitis Father   . ADD / ADHD Daughter   . Cerebral palsy Son     SOCIAL HISTORY:  reports that she has never smoked. She has never used smokeless tobacco. She reports that she does not drink alcohol or use drugs.  Social History   Social History  . Marital status: Single    Spouse name: N/A  . Number of children: N/A  . Years of education: N/A   Social History Main Topics  . Smoking status: Never Smoker  . Smokeless tobacco: Never Used  . Alcohol use No  . Drug use: No  . Sexual activity: Not Currently    Birth control/ protection: None, IUD   Other Topics Concern  . Not on file   Social History Narrative  . No narrative on file    PERFORMANCE STATUS: The patient's performance status is 0 - Asymptomatic  PHYSICAL EXAM: Most Recent Vital Signs: Blood pressure 121/75, pulse 94, temperature 98.7 F (37.1 C), temperature source Oral, resp. rate 16, weight 163 lb 12.8 oz (74.3 kg), SpO2 98 %. Physical Exam  Constitutional: She is oriented to person, place, and time and well-developed, well-nourished, and in no distress.  HENT:    Head: Normocephalic and atraumatic.  Mouth/Throat: Oropharynx is clear and moist.  Eyes: Conjunctivae and EOM are normal. Pupils are equal, round, and reactive to light.  Neck: Normal range of motion. Neck supple.  Cardiovascular: Normal rate, regular rhythm and normal heart sounds.   Pulmonary/Chest: Effort normal and breath sounds normal.  Abdominal: Soft. Bowel sounds are normal.  Musculoskeletal: Normal range of motion.  Neurological: She is alert and oriented to person, place, and time. Gait normal.  Skin: Skin is warm and dry.  Nursing note and vitals reviewed.   LABORATORY DATA:  I have reviewed the data as listed. No results found for this or any previous visit (from the past 48 hour(s)).    RADIOGRAPHY: Ct Soft Tissue Neck W Contrast  Result Date: 01/30/2016 CLINICAL DATA:  41 y/o F; left tonsil squamous cell carcinoma for staging. EXAM: CT NECK WITH CONTRAST TECHNIQUE: Multidetector CT imaging of the neck was performed  using the standard protocol following the bolus administration of intravenous contrast. CONTRAST:  155mL ISOVUE-300 IOPAMIDOL (ISOVUE-300) INJECTION 61% COMPARISON:  None. FINDINGS: Pharynx and larynx: There is asymmetric enlargement of the left palatini tonsil with soft tissue thickening extending superiorly to the uvula and the left aspect of the soft palate up to the margin of the soft palate and hard palate (series 9, image 59 and series 10, image 42). Inferiorly soft tissue thickening occupies the left lateral, anterior, and posterior aspects of the pharyngeal and hypopharyngeal wall. The inferior most extent of soft tissue thickening appears to reach the margin of the supraglottis without invasion of the supraglottic or glottic structures (series 10, image 56 and series 7, image 39). Anteriorly there is effacement of the left glossopharyngeal sulcus but no appreciable invasion of the base of tongue. There is no effacement of parapharyngeal or prevertebral fat.  Salivary glands: No inflammation, mass, or stone. Thyroid: Normal. Lymph nodes: Lymph nodes in the left-sided 2 and 3 cervical lymph node levels are mildly asymmetrically enlarged in comparison with the contralateral neck (coronal image 61 and 65). The largest lymph node is the jugular digastric node the which measures 14 x 12 x 26 mm (AP x ML x CC) series 7, image 36 and series 10, image 61. The lymph nodes are well circumscribed without evidence for necrosis or extranodal extension. Vascular: Negative. Limited intracranial: Negative. Visualized orbits: Negative. Mastoids and visualized paranasal sinuses: Clear. Skeleton: No acute or aggressive process. Upper chest: Negative. Other: None. IMPRESSION: Motion artifact at the level of oropharynx and base of tongue. Fine soft tissue details lost at these levels. Pharyngeal mass centered in the left palatini tonsil with surround mucosal thickening as described below probably representing neoplasm. Mucosal thickening of the left aspect of hypopharynx and pharynx with inferior most extension to the margin of the supraglottic airway without appreciable invasion into epiglottis, aryepiglottic folds, vocal cords, or paraglottic fat. Superior most extension of mucosal thickening to the left aspect of the uvula and along the left aspect of the soft palate to the hard/soft palate junction, this region is obscured by motion artifact. Anteriorly there is effacement of left glossopharyngeal sulcus without appreciable tongue base invasion, this region is obscured by motion artifact. Asymmetric enlargement of left-sided level 2 and 3 cervical lymph nodes possibly metastatic. No evidence for lymph node necrosis or extra nodal extension. Electronically Signed   By: Kristine Garbe M.D.   On: 01/30/2016 00:40   Ct Chest W Contrast  Result Date: 01/30/2016 CLINICAL DATA:  Left tonsillar cancer, for staging EXAM: CT CHEST WITH CONTRAST TECHNIQUE: Multidetector CT imaging of  the chest was performed during intravenous contrast administration. CONTRAST:  137mL ISOVUE-300 IOPAMIDOL (ISOVUE-300) INJECTION 61% COMPARISON:  None. FINDINGS: Cardiovascular: Heart is normal in size.  No pericardial effusion. No evidence of thoracic aortic aneurysm. Mediastinum/Nodes: No suspicious mediastinal lymphadenopathy. Triangular soft tissue in the anterior mediastinum (series 2/image 30) likely reflects residual thymus. Visualized thyroid is unremarkable. Lungs/Pleura: No suspicious pulmonary nodules. Calcified granuloma in the superior segment right lower lobe (series 3/ image 50), benign. No focal consolidation. No pleural effusion or pneumothorax. Upper Abdomen: Visualized upper abdomen is notable for a 7 mm cyst in the inferior right hepatic lobe (series 2/ image 137). Musculoskeletal: Visualized osseous structures are within normal limits. IMPRESSION: No evidence of metastatic disease in the chest. Electronically Signed   By: Julian Hy M.D.   On: 01/30/2016 07:47       PATHOLOGY:  ASSESSMENT/PLAN:  Tonsillar carcinoma, cT3, cN1,M0 HPV positive Non smoker  No problem-specific Assessment & Plan notes found for this encounter.  01/29/2016 CT Chest and soft tissue neck reviewed. Results are noted above.  HPV positive tonsillar cancer.She will need PET/CT prior to radiation. This has been arranged.   She will be scheduled for port and PEG tube placement. Based upon CT imaging she will need concurrent therapy.   I will reach out to discuss the patient's case with radiation oncologist Dr. Isidore Moos.  She will be referred to dentist, Dr. Enrique Sack.  She will be referred to speech therapy, social work, and nutrition.  She will return for follow up post PET for additional teaching and to answer any questions. She has been encouraged to reach out with problems or concerns.  ORDERS PLACED FOR THIS ENCOUNTER: No orders of the defined types were placed in this  encounter.  Orders Placed This Encounter  Procedures  . NM PET Image Initial (PI) Skull Base To Thigh    Standing Status:   Future    Number of Occurrences:   1    Standing Expiration Date:   01/29/2017    Order Specific Question:   Reason for Exam (SYMPTOM  OR DIAGNOSIS REQUIRED)    Answer:   staging head and neck cancer    Order Specific Question:   Is the patient pregnant?    Answer:   No    Order Specific Question:   Preferred imaging location?    Answer:   [facility]    Order Specific Question:   If indicated for the ordered procedure, I authorize the administration of a radiopharmaceutical per Radiology protocol    Answer:   Yes  . IR GASTROSTOMY TUBE MOD SED    Standing Status:   Future    Number of Occurrences:   1    Standing Expiration Date:   03/29/2017    Order Specific Question:   Reason for Exam (SYMPTOM  OR DIAGNOSIS REQUIRED)    Answer:   head and neck cancer, chemoXRT    Order Specific Question:   Is the patient pregnant?    Answer:   No    Order Specific Question:   Preferred Imaging Location?    Answer:   [facility]    All questions were answered. The patient knows to call the clinic with any problems, questions or concerns. We can certainly see the patient much sooner if necessary.  This document serves as a record of services personally performed by Ancil Linsey, MD. It was created on her behalf by Arlyce Harman, a trained medical scribe. The creation of this record is based on the scribe's personal observations and the provider's statements to them. This document has been checked and approved by the attending provider.  I have reviewed the above documentation for accuracy and completeness and I agree with the above.  This note is electronically signed Reuben Likes, MD :01/30/2016 11:58 AM

## 2016-01-30 NOTE — Telephone Encounter (Signed)
Oncology Nurse Navigator Documentation  Placed introductory call to new referral patient Heather Strickland.  Spoke with her dtr-in-law who interprets for her, provides transportation.  Introduced myself as the H&N oncology nurse navigator that works with Dr. Isidore Moos to whom she has been referred by Dr. Whitney Muse.  She confirmed her understanding of referral, confirmed understanding of upcoming appts including PET 1/26 at [facility], .  I briefly explained my role as navigator, indicated that I would be joining them during consult with Dr. Isidore Moos on 2/2.  .  I explained I will see them at [facility] on 1/31 to provide PEG education.  I confirmed her understanding of [place] location, explained arrival and RadOnc registration process for appt.  I provided my contact information, encouraged her to call with questions/concerns before next week.  She verbalized understanding of information provided, expressed appreciation for my call.  Gayleen Orem, RN, BSN, [place] Oncology Nurse [place] at [place] [phone]

## 2016-01-30 NOTE — Patient Instructions (Signed)
[place] at [facility] Discharge Instructions  RECOMMENDATIONS MADE BY THE CONSULTANT AND ANY TEST RESULTS WILL BE SENT TO YOUR REFERRING PHYSICIAN.  You were seen today by Dr. Whitney Muse PET scan, nutrition consult, speech/language pathologist consult, social work referral,  We will refer you to Dr. Enrique Sack  For dental work and Dr. Isidore Moos for port and peg placement Shellia Carwin, RN is our patient nurse navigator, she will be contacting you for chemotherapy teaching We will contact you about insurance assistance  Thank you for choosing [place] at [facility] to provide your oncology and hematology care.  To afford each patient quality time with our provider, please arrive at least 15 minutes before your scheduled appointment time.    If you have a lab appointment with the [facility] please come in thru the  Main Entrance and check in at the main information desk  You need to re-schedule your appointment should you arrive 10 or more minutes late.  We strive to give you quality time with our providers, and arriving late affects you and other patients whose appointments are after yours.  Also, if you no show three or more times for appointments you may be dismissed from the clinic at the providers discretion.     Again, thank you for choosing [facility].  Our hope is that these requests will decrease the amount of time that you wait before being seen by our physicians.       _____________________________________________________________  Should you have questions after your visit to [facility], please contact our office at [phone] between the hours of 8:30 a.m. and 4:30 p.m.  Voicemails left after 4:30 p.m. will not be returned until the following business day.  For prescription refill requests, have your pharmacy contact our office.       Resources For Cancer Patients and their Caregivers ? American  Cancer Society: Can assist with transportation, wigs, general needs, runs Look Good Feel Better.        810 099 3218 ? Cancer Care: Provides financial assistance, online support groups, medication/co-pay assistance.  1-800-813-HOPE (224)403-5733) ? [place] Assists [place] cancer patients and their families through emotional , educational and financial support.  704-687-5513 ? [place] DSS Where to apply for food stamps, Medicaid and utility assistance. 437-777-6003 ? [employer]: Transportation to medical appointments. 934-439-0496 ? Social Security Administration: May apply for disability if have a Stage IV cancer. 4580212307 641 258 2076 ? [employer], Disability and Transit Services: Assists with nutrition, care and transit needs. [place] Support Programs: @10RELATIVEDAYS @ > Cancer Support Group  2nd Tuesday of the month 1pm-2pm, Journey Room  > Creative Journey  3rd Tuesday of the month 1130am-1pm, Journey Room  > Look Good Feel Better  1st Wednesday of the month 10am-12 noon, Journey Room (Call [place] to register [phone])

## 2016-02-02 ENCOUNTER — Ambulatory Visit: Payer: Self-pay | Admitting: Physician Assistant

## 2016-02-02 NOTE — Progress Notes (Signed)
Late entry Met with pt face to face.  Introduced myself and explain a little bit about my role as the patient navigator.  Pt given a card with all my information on it.  Told pt to call if they had any questions or concerns.  Pt verbalized understanding.  Pt will be set up for PET scan, met with nutrition, social work, Dr Kuilski, dietician, to get a port and PEG tube, and to met RAD ONC.  Then I will set for bed side teaching when pt comes in for first treatment of cisplatin.  

## 2016-02-03 ENCOUNTER — Encounter (HOSPITAL_COMMUNITY): Payer: Self-pay | Admitting: Speech Pathology

## 2016-02-03 ENCOUNTER — Ambulatory Visit (HOSPITAL_COMMUNITY): Payer: Self-pay | Attending: Hematology & Oncology | Admitting: Speech Pathology

## 2016-02-03 ENCOUNTER — Ambulatory Visit (HOSPITAL_COMMUNITY): Payer: Self-pay | Admitting: Hematology & Oncology

## 2016-02-03 ENCOUNTER — Other Ambulatory Visit (HOSPITAL_COMMUNITY): Payer: Self-pay | Admitting: Speech Pathology

## 2016-02-03 DIAGNOSIS — R1312 Dysphagia, oropharyngeal phase: Secondary | ICD-10-CM | POA: Insufficient documentation

## 2016-02-03 NOTE — Therapy (Signed)
[facility] [place], [place], [address] Phone: [phone]   Fax:  [phone]  Speech Language Pathology Evaluation/Clinical Swallow Evaluation  Patient Details  Name: Heather Strickland MRN: [id number] Date of Birth: 1975/12/27 No Data Recorded  Encounter Date: 02/03/2016      End of Session - 02/03/16 1840    Visit Number 1   Number of Visits 3   Date for SLP Re-Evaluation 04/02/16   Authorization Type [facility] discount pending   SLP Start Time 60   SLP Stop Time  1725   SLP Time Calculation (min) 85 min   Activity Tolerance Patient tolerated treatment well      Past Medical History:  Diagnosis Date  . Anemia   . GERD (gastroesophageal reflux disease)   . 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 CESAREAN SECTION  [id number]  . ESOPHAGOGASTRODUODENOSCOPY N/A 11/07/2015   Dr. Gala Romney: normal esophagus, chronic gastritis    01/29/2016 Imaging     CT neck-  Motion artifact at the level of oropharynx and base of tongue. Fine soft tissue details lost at these levels.  Pharyngeal mass centered in the left palatini tonsil with surround mucosal thickening as described below probably representing neoplasm.  Mucosal thickening of the left aspect of hypopharynx and pharynx with inferior most extension to the margin of the supraglottic airway without appreciable invasion into epiglottis, aryepiglottic folds, vocal cords, or paraglottic fat.  Superior most extension of mucosal thickening to the left aspect of the uvula and along the left aspect of the soft palate to the hard/soft palate junction, this region is obscured by motion artifact.  Anteriorly there is effacement of left glossopharyngeal sulcus without appreciable tongue base invasion, this region is obscured by motion artifact.  Asymmetric enlargement of left-sided level 2 and 3  cervical lymph nodes possibly metastatic. No evidence for lymph node necrosis or extra nodal extension.      01/29/2016 Imaging    CT chest-  No evidence of metastatic disease in the chest.    There were no vitals filed for this visit.      Subjective Assessment - 02/03/16 1831    Subjective Pt reports occasional pain in throat   Patient is accompained by: Family member;Interpreter  daughter in law, Heather Strickland   Currently in Pain? Yes   Pain Score 4    Pain Location Throat   Pain Orientation Right;Left   Pain Descriptors / Indicators Throbbing   Pain Frequency Intermittent          Prior Functional Status - 02/03/16 1836      Prior Functional Status   Cognitive/Linguistic Baseline Within functional limits   Type of Home Mobile home    Lives With Spouse   Available Help at Discharge Family         General - 02/03/16 1836      General Information   Date of Onset 01/20/16   HPI Heather Lorenzo-Rendonis a 41 y.o.femalewith a medical history significant for GERD, H/O H. Pylori, obesity, and pre-diabetes who is referred to SLP for dysphagia evaluation due to new tonsilar cancer diagnosis. She underwent left tonsil biopsy with Dr. 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 CT of neck results below. She presented to Dr. Benjamine Mola after being referred by [name], NP (GI) with a 5-6  month history of severe sore throat. Of note, Pt had EGD completed 11/07/15 and barium esophagram on 01/01/2016. Pt has PET scan scheduled for 02/06/2016, PEG tube placement at [facility] the following week, and an appointment to see Dr. Enrique Sack. Pt is accompanied to all appointments by her daughter-in-law, Heather Strickland who also translates for her. Pt requested that Heather Strickland translate instead of an Research officer, political party. Plan for chemoradiation has not been finalized at this time.    Type of Study  Bedside Swallow Evaluation   Previous Swallow Assessment None on record, but did have EGD 10/2015 and Esophagram 12/2015   Diet Prior to this Study Regular;Thin liquids   Temperature Spikes Noted No   Respiratory Status Room air   History of Recent Intubation No   Behavior/Cognition Alert;Cooperative;Pleasant mood   Oral Cavity Assessment Within Functional Limits   Oral Care Completed by SLP No   Oral Cavity - Dentition Adequate natural dentition   Vision Functional for self-feeding   Self-Feeding Abilities Able to feed self   Patient Positioning Upright in chair   Baseline Vocal Quality Normal   Volitional Cough Strong   Volitional Swallow Able to elicit          Oral Motor/Sensory Function - 02/03/16 1838      Oral Motor/Sensory Function   Overall Oral Motor/Sensory Function Within functional limits         Ice Chips - 02/03/16 1838      Ice Chips   Ice chips Not tested         Thin Liquid - 02/03/16 1838      Thin Liquid   Thin Liquid Within functional limits   Presentation Cup;Straw;Self Fed         Nectar thick liquid - 02/03/16 1839      Nectar Thick Liquid   Nectar Thick Liquid Not tested         Honey Thick Liquid - 02/03/16 1839      Honey Thick Liquid   Honey Thick Liquid Not tested         Puree - 02/03/16 1839      Puree   Puree Within functional limits   Presentation Self Fed;Spoon         Solid - 02/03/16 1839      Solid   Solid Within functional limits   Presentation Self Fed             SLP Education - 02/03/16 [place]    Education provided Yes   Education Details Possible side effects of chemoradiation   Person(s) Educated Patient;Child(ren)   Methods Explanation;Verbal cues;Handout   Comprehension Verbalized understanding;Need further instruction     Pt currently tolerates regular textures and thin liquids. Oral motor assessment revealed WNL lingual ROM and WNL lingual strength. Labial ROM was WNL and strength  was WNL. Velar ROM appeared mildly reduced. POs: Pt consumed regular textures and thin liquids without overt s/s aspiration. Thyroid elevation appeared WNL, and swallows appeared timely.   Because data states the risk for dysphagia during and after radiation treatment is high due to undergoing radiation tx, SLP taught pt about the possibility of reduced/limited ability for PO intake during rad tx. SLP encouraged pt to continue swallowing POs as far into rad tx as possible, even ingesting POs and/or completing HEP shortly after administration of pain meds.   SLP educated pt re: changes to swallowing musculature after rad tx, and why adherence to dysphagia HEP provided today and PO consumption was necessary to inhibit  muscular disuse atrophy and to reduce muscle fibrosis following rad tx. Pt demonstrated understanding of these things to SLP. Further education was provided re: xerostomia, mucositis/esophagitis, and late effects head/neck radiation.        Plan - 02/03/16 1844    Clinical Impression Statement Pt seen for clinical swallow evaluation and accompanied by her daughter-in-law, Heather Strickland who served as Patent attorney per [employer] request. Pt currently tolerating all foods and liquids without discomfort or signs/symptoms of aspiration. Recommend that Pt continue with self regulated diet as able. She was provided with a list of soft food recommendations to try if/when her mouth becomes too sore for regular textures. Pt provided written information in [place] (through use of Designer, television/film set). She was also given my contact information should she have further questions or note signs/symptoms of aspiration. SLP will plan to follow up with Pt after establishment of treatment plan.   Duration --  1x/month for 3 months   Treatment/Interventions Aspiration precaution training;SLP instruction and feedback;Patient/family education   Potential to Achieve Goals Good   SLP Home Exercise Plan Pt  will be independent with HEP as assigned to facilitate carryover of treatment strategies and techniques in home environment.   Consulted and Agree with Plan of Care Patient;Family member/caregiver   Family Member [employer], daughter-in-law     Long Term Goal: Pt will demonstrate safe and efficient consumption of self-regulated regular textures and unrestricted liquids with use of compensatory strategies as needed.   Short Term Goals: Pt will verbalize 3+ signs/symptoms of possible aspiration with min cues from SLP and written source as needed.  Pt will complete oropharyngeal swallowing exercises x10 each after introduced by SLP and given mi/mod cues for accurate execution.    Pt will record percent of oral intake in food journal daily once treatment begins   Patient will benefit from skilled therapeutic intervention in order to improve the following deficits and impairments:   Dysphagia, oropharyngeal phase    Problem List Patient Active Problem List   Diagnosis Date Noted  . 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 GERD (gastroesophageal reflux disease) 12/22/2015  . H. pylori infection 07/21/2015  . Esophageal reflux 06/19/2015  . Prediabetes 06/19/2015  . Obesity, unspecified 06/19/2015  . Absolute anemia 06/19/2015   Thank you,  Genene Churn, CCC-SLP [phone]  [facility] 02/03/2016, 6:47 PM  [facility] [address] [place], [place], [address] Phone: [phone]   Fax:  [phone]  Name: Heather Strickland MRN: [id number] Date of Birth: December 12, 1975

## 2016-02-03 NOTE — Patient Instructions (Signed)
Radiation Therapy to the Head and Neck: What You Need to Know About Swallowing   This information describes swallowing problems that can be caused by radiation therapy to the head and neck and how to prevent them. Normal Swallowing Many muscles and nerves work together to help you swallow (see figure below). When you eat and drink, food and liquids mix with your saliva. Your saliva makes the food soft and moist and chewing breaks the food down further. As you chew, the food and saliva form a ball called a bolus.  The bolus gets pushed to the back of your mouth with your tongue. Then, a reflex takes over and the back of your tongue squeezes back and your larynx (voice box) closes. This sends the bolus down your esophagus (food pipe) and into your stomach. If your tongue is weak or if your larynx doesn't close all the way, food or liquid will enter your trachea (wind pipe) or lungs (aspiration), which can cause pneumonia.  Effects of Cancer and Treatment on Swallowing Depending on the size and location of your tumor, the structures that support normal swallowing may not work well. The side effects of treatment can also affect these structures. Radiation therapy can cause: Sores (mucositis) in the mouth and throat  Dry mouth  Thicker saliva  Swelling  Pain when swallowing These symptoms begin about the second week of treatment and may get worse during treatment. Most symptoms will start to improve about 2 weeks after treatment has ended. Radiation therapy can also cause permanent tissue scarring. The effects of this scarring depend on the area that was treated. Below are some effects of scarring: The muscles attached to your jaw may tighten and make it difficult to open your mouth and chew your food. This is called trismus.  Your salivary glands may not produce enough saliva. This can make swallowing difficult because your mouth is too dry.  The muscles in your tongue and the back of your throat  may not be able to move as well. This can make it harder to push the bolus down your throat and open up your esophagus.  Your larynx may not lift enough to open your esophagus.  Your esophagus may narrow, which can cause food to get stuck in the back of your throat. Not everyone will have all of these problems. Your treatment will be planned to decrease your chances of developing them. Your healthcare team will also teach you things that you can do to help decrease these problems. Other treatments can also affect swallowing. Surgery can affect the structures in your mouth and throat, which could make swallowing more difficult. Some chemotherapy medications can cause sores in the mouth and throat. This can make swallowing painful.  Swallowing Problems Difficulty swallowing is called dysphagia (dis-fey-juh). Your healthcare team will work with you to help you manage this problem. This team includes your doctors, nurses, a swallowing specialist, and a dietitian. Painful swallowing If you have painful swallowing during your treatment, you will be given pain medication to manage it. Take the medication as instructed by your doctor. If it does not help, tell your doctor or nurse. Try to use your swallowing muscles as much as you can. This can help prevent long-term problems with swallowing. Tell your doctor or nurse and swallowing specialist if you have trouble opening your mouth during or after your treatment. They can teach you exercises to help with this. Aspiration When you are having trouble swallowing, food or liquid can pool   in the back of your throat. It can then pass into your airway instead of your esophagus. This is called aspiration. Signs of aspiration include coughing during or after swallowing. Do not try to swallow if you have any of these signs. Call your swallowing specialist immediately. He or she will review the swallowing exercises with you and help figure out why you are having trouble.  He or she will also tell you which foods and liquids are safe for you. Aspiration can lead to pneumonia. Call your doctor or nurse immediately if you have any of the following symptoms: Shortness of breath  Wheezing  Painful breathing  A productive cough (a cough that produces phlegm or mucus)  A temperature of 100.4 F (38 C) or higher  Managing Swallowing Problems Swallowing therapy You will see a swallowing specialist before and during your treatment. He or she will: Explain how treatment can affect your swallowing  Teach you exercises to stretch and strengthen the muscles involved in swallowing Exercises Do these exercises as soon as you begin treatment. They may help decrease long-term swallowing problems. Swallowing Exercises Tongue hold exercise ([place] exercise) 1. Put the tip of your tongue in between your teeth. If you cannot do this, put it against the front of the roof of your mouth. Stick your tongue out as far as you can. 2. Hold this position with your tongue and swallow. Try not to let your tongue tip slip back. Then, relax. Effortful swallow exercise 1. Swallow normally, but squeeze hard with your throat and tongue muscles. 2. Then, relax. Mendelsohn swallow maneuver exercise 1. Swallow normally; feel your voice box go up and down. 2. Swallow again. When you feel your voice box go up, squeeze hard and hold it up for 2 seconds. Then, relax. Supraglottic swallow exercise 1. Take in a regular breath and hold it tightly. 2. While holding your breath, swallow; immediately, cough out the breath. Do not inhale before you cough. Super supraglottic swallow exercise 1. Take in a regular breath and hold it tightly, bearing down as if you are having a bowel movement. 2. While holding your breath, swallow; immediately, cough out the breath. Do not inhale before you cough. Shaker exercise 1. Lay flat on the floor or a bed. 2. Lift your head as if you are looking at your  toes. 3. Hold your head in this position for 10 seconds. Increase the amount of time until you can hold it for 1 minute. Then, relax. 4. Lift your head and lay it back down (do not hold it up.) Repeat this movement 30 times. Do these exercises _____ times a day. Tongue range of motion (ROM) exercises Tongue protrusion exercise 1. Stick out your tongue as far as you can until you feel a good stretch. 2. Hold it there for ________________________. Then, relax. Tongue retraction exercise 1. Pull your tongue far back in your mouth, as if you are gargling or yawning. 2. Hold it there for ________________________. Then, relax. Tongue lateralization exercise 1. Move your tongue as far to the left as you can so you feel a good stretch in your tongue. 2. Hold it there for ________________________. Then, relax. 3. Move your tongue to the right as far as you can until you feel a good stretch in your tongue. 4. Hold it there for ________________________. Then, relax. Tongue tip exercise 1. Place the tip of your tongue behind your top teeth or on your gums. 2. While holding this position, open your mouth as   wide as possible for ______________. Then, relax. Back tongue exercise 1. Say "kuh" with a strong "k" sound; move the back of your tongue as if you were going to say the "k" sound. 2. Hold this position for__________________. Then, relax. Do these exercises _____ times a day. Tongue Strengthening Exercises You will need a tongue depressor or spoon to do these exercises. Tongue tip strengthening exercise 1. Stick out your tongue as far as you can and push it firmly against a tongue depressor or spoon. 2. Hold this position for__________________. Then, relax. Sides of tongue strengthening exercise 1. Place the tongue depressor or spoon against the left side of your tongue; push your tongue firmly against the tongue depressor or spoon. 2. Hold this position for__________________. Then,  relax. 3. Place the tongue depressor or spoon against the right side of your tongue; push your tongue firmly against the tongue depressor or spoon. 4. Hold this position for__________________. Then, relax. Top of the tongue strengthening exercise 1. Push down on your tongue with a tongue depressor or spoon. As you are doing this, push up with your tongue. 2. Hold this position for__________________. Then, relax. Do these exercises _____ times a day. Jaw Exercises Active range of motion and stretching exercises Sit or stand. Hold your head still while doing these exercises. 1. Open your mouth as wide as you can, until you can feel a good stretch but no pain. Hold this stretch for ______ seconds.   2. Move your jaw to the left. Hold this stretch for 3 seconds.   3. Move your jaw to the right. Hold this stretch for 3 seconds.   4. Move your jaw in a circle. Make 5 circles in each direction.   Do these exercises _____ times a day. Passive Stretching Exercise  1. Place 1 thumb on your top teeth in the middle of your jaw. 2. Place the pointer (index) finger of your other hand on your bottom teeth in the middle of your jaw. 3. Open your mouth with your fingers, but do not bite down or resist. Let your fingers do all of the work. 4. Hold this stretch for _____ seconds. Your swallowing specialist may teach you other exercises or strategies to help you continue swallowing throughout your treatment. These will be based on your swallowing evaluations. Dietary Guidelines Eating well is an important part of your cancer treatment. If you are having pain or difficulty swallowing, you may not be able to eat enough food. This can make you lose weight and decrease your energy. You also may not be able to drink enough liquid to stay hydrated. The suggestions in this section may help make swallowing easier and more comfortable for you. Your swallowing specialist will recommend the proper food and liquid  textures for you. When you try new foods and liquids, they should have the textures recommended by your swallowing specialist. The following foods may irritate your throat or be hard to swallow: Very hot or cold foods and liquids  Citrus fruits and juices (e.g., orange, lemon, lime, grapefruit, pineapple)  Tomatoes  Hard, dry, or coarse foods (e.g., toast, crackers, raw vegetables, potato chips, pretzels)  Spices (e.g., pepper, chili powder, horseradish, curry powder, hot sauce, nutmeg) The following foods may be easier to swallow: Soft, pured, or moist foods such as souffls or casseroles  Soft foods such as custards and puddings  Lukewarm or room-temperature foods  Liquid nutritional supplements, such as:  Carnation Breakfast EssentialsTM  Carnation Instant Breakfast VHC  Scandishake    Ensure  Boost  Glucerna (if you have diabetes) These items can be found at your local grocery store, pharmacy, or on the Internet. Try the following suggestions if dry mouth or thick saliva is a problem for you: Drink 8 to 10 cups of liquids a day. Being well-hydrated will help loosen thick saliva.  Keep a bottle of water or other liquid with you when you are away from home. Sip from it frequently throughout the day.  Chew sugarless gum or suck on sugarless candy. This can cause more saliva to flow.  Add sauces, gravies, or other liquids to your foods.  Use a humidifier at home to help loosen thick saliva and secretions. For more dietary recommendations, ask your nurse or dietitian.  Feeding Tube  You may have a feeding tube placed to maintain your weight and ensure you get the nutrition you need. Even if you are using a feeding tube, eat and drink as much as you can. This will decrease the chance that you will have long-term swallowing problems. If you have a feeding tube placed, you nurse will teach you how to use it and give you written instructions. He or she will tell you the kind of formula  to use and how much formula to use for each feeding. At the end of your treatment, your doctor or nurse will tell you how to start taking all of your foods and liquids by mouth. The feeding tube will be removed when you no longer need it. If you have any questions about your feeding tube, contact the office of the doctor.  

## 2016-02-05 ENCOUNTER — Encounter: Payer: Self-pay | Admitting: Physician Assistant

## 2016-02-05 ENCOUNTER — Telehealth (HOSPITAL_COMMUNITY): Payer: Self-pay

## 2016-02-05 ENCOUNTER — Ambulatory Visit (INDEPENDENT_AMBULATORY_CARE_PROVIDER_SITE_OTHER): Payer: Self-pay | Admitting: Otolaryngology

## 2016-02-05 DIAGNOSIS — Z975 Presence of (intrauterine) contraceptive device: Secondary | ICD-10-CM

## 2016-02-05 DIAGNOSIS — C099 Malignant neoplasm of tonsil, unspecified: Secondary | ICD-10-CM

## 2016-02-05 NOTE — Telephone Encounter (Signed)
[facility] PET department called requesting order for urine pregnancy on patient since she has an IUD prior to her PET in am. Reviewed with oncologist. Order entered.

## 2016-02-06 ENCOUNTER — Ambulatory Visit
Admission: RE | Admit: 2016-02-06 | Discharge: 2016-02-06 | Disposition: A | Discharge status: Home / Self Care | Payer: Self-pay | Source: Ambulatory Visit | Attending: Hematology & Oncology | Admitting: Hematology & Oncology

## 2016-02-06 ENCOUNTER — Other Ambulatory Visit
Admission: RE | Admit: 2016-02-06 | Discharge: 2016-02-06 | Disposition: A | Discharge status: Home / Self Care | Payer: Self-pay | Source: Ambulatory Visit | Attending: Hematology & Oncology | Admitting: Hematology & Oncology

## 2016-02-06 DIAGNOSIS — I517 Cardiomegaly: Secondary | ICD-10-CM | POA: Insufficient documentation

## 2016-02-06 DIAGNOSIS — C099 Malignant neoplasm of tonsil, unspecified: Secondary | ICD-10-CM | POA: Insufficient documentation

## 2016-02-06 DIAGNOSIS — R918 Other nonspecific abnormal finding of lung field: Secondary | ICD-10-CM | POA: Insufficient documentation

## 2016-02-06 LAB — GLUCOSE, CAPILLARY: Glucose-Capillary: 93 mg/dL (ref 65–99)

## 2016-02-06 LAB — PREGNANCY, URINE: PREG TEST UR: NEGATIVE

## 2016-02-06 MED ORDER — FLUDEOXYGLUCOSE F - 18 (FDG) INJECTION
11.8600 | Freq: Once | INTRAVENOUS | Status: AC | PRN
  Administered 2016-02-06: 11.86 via INTRAVENOUS

## 2016-02-09 ENCOUNTER — Telehealth: Payer: Self-pay | Admitting: *Deleted

## 2016-02-09 NOTE — Telephone Encounter (Signed)
Oncology Nurse Navigator Documentation  Spoke with Missy, [employer], requested fax of patient's tonsil bx, provided RadOnc fax #.  Gayleen Orem, RN, BSN, [place] Oncology Nurse [place] [facility] [place] [phone]

## 2016-02-10 ENCOUNTER — Encounter: Payer: Self-pay | Admitting: *Deleted

## 2016-02-10 ENCOUNTER — Telehealth: Payer: Self-pay | Admitting: *Deleted

## 2016-02-10 ENCOUNTER — Telehealth (HOSPITAL_COMMUNITY): Payer: Self-pay | Admitting: Emergency Medicine

## 2016-02-10 ENCOUNTER — Other Ambulatory Visit: Payer: Self-pay | Admitting: General Surgery

## 2016-02-10 NOTE — Telephone Encounter (Signed)
Oncology Nurse Navigator Documentation  Spoke with patient's dtr-in-law, Joellen Jersey, confirmed understanding of tomorrow morning's 0730 arrival to [facility] for placement of PEG and PAC.  Joellen Jersey is a primary caregiver and interpretor for Mrs. Rendon.   I explained I will be providing PEG use/care instructions tomorrow after the procedures.  Katie noted she will be arriving around 1500 to assist with her mother-in-law's return home, that will be a good time for me to educate.  Gayleen Orem, RN, BSN, [place] [facility] Oncology Nurse [place] [facility] [place] [phone]

## 2016-02-10 NOTE — Telephone Encounter (Signed)
Pt had a bunch of questions.   Does she still take her daily medications?  Yes and even during chemo.  Does she still need to have a port placed?  Yes no matter what the PET scan shows still still has cancer that needs to be treated.   Does her husband need to be treated for HPV?  At this time there is no treatment for HPV.   When will she see the doctor/start treatment and how will they know that appt?  I will coordinate with radiation of when to start chemotherapy.  They will start at the same time and she will see the doctor the first day of treatment.    I had to give them my name and number again because they lost the card that was given to them.  They stated that it was very hard to get in touch with the cancer center.

## 2016-02-10 NOTE — Progress Notes (Signed)
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 She also reported that she had been spitting up blood and felt like something was pressing against the left side of her throat over the last 3 months.   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    Nutrition Status Yes No Comments  Weight changes? [x]  []  She has lost a few lbs over the past few weeks.   Swallowing concerns? []  [x]  She denies.   PEG? [x]  []  Placed 02/11/16   Referrals Yes No Comments  Social Work? []  [x]    Dentistry? [x]  []  Today 02/13/16  Swallowing therapy? [x]  []  She saw ST in [place]? []  [x]    Med/Onc? [x]  []  Dr. Whitney Muse, she will receive chemotherapy   Safety Issues Yes No Comments  Prior radiation? []  [x]    Pacemaker/ICD? []  [x]    Possible current pregnancy? []  [x]  IUD  Is the patient on methotrexate? []  [x]     Tobacco/Marijuana/Snuff/ETOH use: Never a smoker, no ETOH use.   Past/Anticipated interventions by otolaryngology, if any:  01/15/16 Left Tonsil biopsy by Dr. Benjamine Mola  Past/Anticipated interventions by medical oncology, if any:  Dr. Whitney Muse. Her daughter-in-law states that chemotherapy is planned.      Current Complaints / other details:  None  BP 114/63   Pulse 71   Temp 98.9 F (37.2 C)   Ht 5\' 5"  (1.651 m)   Wt 159 lb 9.6 oz (72.4 kg)   SpO2 98% Comment: room air  BMI 26.56 kg/m    Wt Readings from Last 3 Encounters:  02/13/16 159 lb 9.6 oz (72.4 kg)  01/30/16 163 lb 12.8 oz (74.3 kg)  01/27/16 162 lb 8 oz (73.7 kg)

## 2016-02-10 NOTE — Progress Notes (Signed)
[facility] Clinical Social Work  Clinical Social Work was referred by patient navigator for assessment of psychosocial needs due to possible financial and transportation concerns. Clinical Social Worker contacted patient's daughter in law, Heather Strickland who is main point of contact for pt due to language barrier. CSW explained role of CSW and offered support and assess for needs.  Heather Strickland reports she has been able to take pt to most of her appointments and is hopeful pt will get approved for assistance through financial counselor for the hospital, Heather Strickland. Pt will not qualify for medicaid currently. CSW reviewed additional resources to assist, Heather Strickland and Heather Strickland. Per daughter in law, pt has been out of work for several weeks and has low income. She appears to have strong family support and daughter in law was educated about additional interpreter resources available as needed. Heather Strickland plans to share communication with pt and inquire about other needs and concerns. CSW to follow and assist as needed.     Clinical Social Work interventions: Resource education and referral  Heather Strickland, [place] [employer]   [phone]

## 2016-02-11 ENCOUNTER — Other Ambulatory Visit (HOSPITAL_COMMUNITY): Payer: Self-pay | Admitting: Hematology & Oncology

## 2016-02-11 ENCOUNTER — Ambulatory Visit (HOSPITAL_COMMUNITY)
Admission: RE | Admit: 2016-02-11 | Discharge: 2016-02-11 | Disposition: A | Discharge status: Home / Self Care | Payer: Self-pay | Source: Ambulatory Visit | Attending: Hematology & Oncology | Admitting: Hematology & Oncology

## 2016-02-11 ENCOUNTER — Encounter: Payer: Self-pay | Admitting: *Deleted

## 2016-02-11 ENCOUNTER — Encounter (HOSPITAL_COMMUNITY): Payer: Self-pay

## 2016-02-11 DIAGNOSIS — D630 Anemia in neoplastic disease: Secondary | ICD-10-CM | POA: Insufficient documentation

## 2016-02-11 DIAGNOSIS — K219 Gastro-esophageal reflux disease without esophagitis: Secondary | ICD-10-CM | POA: Insufficient documentation

## 2016-02-11 DIAGNOSIS — C099 Malignant neoplasm of tonsil, unspecified: Secondary | ICD-10-CM | POA: Insufficient documentation

## 2016-02-11 DIAGNOSIS — Z833 Family history of diabetes mellitus: Secondary | ICD-10-CM | POA: Insufficient documentation

## 2016-02-11 DIAGNOSIS — Z8249 Family history of ischemic heart disease and other diseases of the circulatory system: Secondary | ICD-10-CM | POA: Insufficient documentation

## 2016-02-11 HISTORY — PX: IR GENERIC HISTORICAL: IMG1180011

## 2016-02-11 LAB — PROTIME-INR
INR: 0.96
Prothrombin Time: 12.8 seconds (ref 11.4–15.2)

## 2016-02-11 LAB — BASIC METABOLIC PANEL
ANION GAP: 9 (ref 5–15)
BUN: 12 mg/dL (ref 6–20)
CALCIUM: 9.4 mg/dL (ref 8.9–10.3)
CO2: 25 mmol/L (ref 22–32)
Chloride: 104 mmol/L (ref 101–111)
Creatinine, Ser: 0.58 mg/dL (ref 0.44–1.00)
Glucose, Bld: 98 mg/dL (ref 65–99)
Potassium: 3.8 mmol/L (ref 3.5–5.1)
Sodium: 138 mmol/L (ref 135–145)

## 2016-02-11 LAB — CBC WITH DIFFERENTIAL/PLATELET
BASOS ABS: 0 10*3/uL (ref 0.0–0.1)
BASOS PCT: 0 %
Eosinophils Absolute: 0.1 10*3/uL (ref 0.0–0.7)
Eosinophils Relative: 2 %
HEMATOCRIT: 36.6 % (ref 36.0–46.0)
HEMOGLOBIN: 12.5 g/dL (ref 12.0–15.0)
Lymphocytes Relative: 28 %
Lymphs Abs: 1.7 10*3/uL (ref 0.7–4.0)
MCH: 30.6 pg (ref 26.0–34.0)
MCHC: 34.2 g/dL (ref 30.0–36.0)
MCV: 89.7 fL (ref 78.0–100.0)
MONO ABS: 0.4 10*3/uL (ref 0.1–1.0)
Monocytes Relative: 7 %
NEUTROS PCT: 63 %
Neutro Abs: 3.8 10*3/uL (ref 1.7–7.7)
Platelets: 240 10*3/uL (ref 150–400)
RBC: 4.08 MIL/uL (ref 3.87–5.11)
RDW: 13.3 % (ref 11.5–15.5)
WBC: 6.1 10*3/uL (ref 4.0–10.5)

## 2016-02-11 LAB — APTT: APTT: 39 s — AB (ref 24–36)

## 2016-02-11 MED ORDER — SODIUM CHLORIDE 0.9 % IV SOLN
INTRAVENOUS | Status: DC
  Administered 2016-02-11: 08:00:00 via INTRAVENOUS

## 2016-02-11 MED ORDER — IOPAMIDOL (ISOVUE-300) INJECTION 61%
INTRAVENOUS | Status: AC
  Filled 2016-02-11: qty 50

## 2016-02-11 MED ORDER — LIDOCAINE-EPINEPHRINE (PF) 2 %-1:200000 IJ SOLN
INTRAMUSCULAR | Status: AC
  Filled 2016-02-11: qty 20

## 2016-02-11 MED ORDER — MIDAZOLAM HCL 2 MG/2ML IJ SOLN
INTRAMUSCULAR | Status: AC | PRN
  Administered 2016-02-11 (×6): 1 mg via INTRAVENOUS

## 2016-02-11 MED ORDER — FENTANYL CITRATE (PF) 100 MCG/2ML IJ SOLN
INTRAMUSCULAR | Status: AC | PRN
  Administered 2016-02-11 (×2): 25 ug via INTRAVENOUS
  Administered 2016-02-11: 50 ug via INTRAVENOUS

## 2016-02-11 MED ORDER — GLUCAGON HCL RDNA (DIAGNOSTIC) 1 MG IJ SOLR
INTRAMUSCULAR | Status: AC | PRN
  Administered 2016-02-11: 1 mg via INTRAVENOUS

## 2016-02-11 MED ORDER — FENTANYL CITRATE (PF) 100 MCG/2ML IJ SOLN
INTRAMUSCULAR | Status: AC
  Filled 2016-02-11: qty 4

## 2016-02-11 MED ORDER — HEPARIN SOD (PORK) LOCK FLUSH 100 UNIT/ML IV SOLN
INTRAVENOUS | Status: AC
  Filled 2016-02-11: qty 5

## 2016-02-11 MED ORDER — CEFAZOLIN SODIUM-DEXTROSE 2-4 GM/100ML-% IV SOLN
2.0000 g | INTRAVENOUS | Status: AC
  Administered 2016-02-11: 2 g via INTRAVENOUS
  Filled 2016-02-11: qty 100

## 2016-02-11 MED ORDER — GLUCAGON HCL RDNA (DIAGNOSTIC) 1 MG IJ SOLR
INTRAMUSCULAR | Status: AC
  Filled 2016-02-11: qty 1

## 2016-02-11 MED ORDER — LIDOCAINE-EPINEPHRINE (PF) 2 %-1:200000 IJ SOLN
INTRAMUSCULAR | Status: AC | PRN
  Administered 2016-02-11: 20 mL

## 2016-02-11 MED ORDER — LIDOCAINE-EPINEPHRINE (PF) 2 %-1:200000 IJ SOLN
INTRAMUSCULAR | Status: DC | PRN
  Administered 2016-02-11: 10 mL

## 2016-02-11 MED ORDER — HEPARIN SOD (PORK) LOCK FLUSH 100 UNIT/ML IV SOLN
INTRAVENOUS | Status: AC | PRN
  Administered 2016-02-11: 500 [IU]

## 2016-02-11 MED ORDER — OXYCODONE-ACETAMINOPHEN 5-325 MG PO TABS
1.0000 | ORAL_TABLET | ORAL | Status: AC
  Administered 2016-02-11: 1 via ORAL
  Filled 2016-02-11: qty 1

## 2016-02-11 MED ORDER — MIDAZOLAM HCL 2 MG/2ML IJ SOLN
INTRAMUSCULAR | Status: AC
  Filled 2016-02-11: qty 6

## 2016-02-11 NOTE — Procedures (Signed)
Successful fluoroscopic guided insertion of gastrostomy tube.   The gastrostomy tube may be used immediately for medications.   Tube feeds may be initiated in 24 hours as per the primary team.   EBL: Minimal  Successful placement of right IJ approach port-a-cath with tip at the superior caval atrial junction. The catheter is ready for immediate use.  No immediate post procedural complications.  Ronny Bacon, MD Pager #: [phone]

## 2016-02-11 NOTE — Progress Notes (Signed)
Heather Strickland Navigator in talking with patient and patient's husband thru interpretor Marlene [place] [employer], regarding G-Tube.Marland KitchenDischarge instructions given thru interpretor.

## 2016-02-11 NOTE — Discharge Instructions (Signed)
Tubo de gastrostoma: gua para el hogar - Adultos (Gastrostomy Tube Home Guide, Adult) Un tubo de gastrostoma es un tubo que se coloca quirrgicamente dentro del estmago. Tambin se denomina tubo G. Los tubos G se utilizan cuando una persona no puede comer ni beber por s sola la cantidad suficiente de alimentos y lquidos para mantenerse saludable. El tubo se inserta en el estmago a travs de un pequeo corte (incisin) en la piel. Se utiliza para:  Alimentar.  Administrar medicamentos. CUIDADO DEL TUBO DE GASTROSTOMA  Lave sus manos con agua y [place].  Retire el vendaje viejo (si lo hubiera). En algunos estilos de tubos G, puede ser necesario insertar un vendaje entre la piel y [place]. Los tipos ms nuevos de tubos G no requieren vendaje. Consulte a su mdico si es necesario un vendaje.  Controle la zona donde el tubo ingresa en la piel (lugar de insercin) para ver si hay enrojecimiento, hinchazn o supuracin similar al pus (purulenta). Una pequea cantidad de drenaje claro o de color tostado es normal. Controle y asegrese de que no haya tejido cicatrizal (piel) en el lugar de la insercin. Podra tener una apariencia elevada, como un bulto.  Puede utilizar un hisopo de algodn para limpiar la zona que rodea al tubo:  Cuando el tubo G se coloca por primera vez, normalmente puede usarse una solucin salina o agua para limpiar la piel.  Una vez que la piel que rodea al tubo G est curada, puede usar un jabn suave y agua tibia.  Limpie con el hisopo de algodn alrededor del lugar de insercin del tubo G para quitar todos los restos de supuracin o costras que hubiera en el lugar de insercin. RESIDUOS DEL ESTMAGO Los residuos del tubo de alimentacin son los lquidos que se encuentran en el estmago en todo momento. Los residuos pueden controlarse antes de Surveyor, quantity, [employer]ne[employer] o segn le indique el mdico.  Consulte al mdico si hay casos en que es preferible no  alimentarse a travs del tubo, de acuerdo con la cantidad o el tipo de contenido que obtiene del Paramedic.  Controle los residuos introduciendo [place] al tubo G y tirando el tapn de la jeringa hacia atrs. Anote la cantidad y vuelva a Glass blower/designer los residuos en el [place]. CMO PURGAR EL TUBO DE GASTROSTOMA  El tubo G deber purgarse peridicamente con agua limpia y tibia para evitar que se atasque.  Purgue el tubo G despus de [employer] alimentos o medicamentos. Coloque 14ml de agua tibia en Cl[place]. Conecte la jeringa al tubo G y presione lentamente para insertar el agua en el tubo.  No presione los alimentos ni medicamentos, ni lo haga de manera rpida. Purgue el tubo G de manera suave y lenta.  Para purgar medicamentos o alimentos, utilice solo jeringas fabricadas para tubos G.  Su mdico podr pedirle que purgue el tubo G con mayor frecuencia o con ms agua. Si este es el [place], siga las instrucciones de su mdico. ALIMENTACIN El mdico determinar si la alimentacin deber administrarse como un bolo (una cierta cantidad por vez y en momentos determinados) o si se administrar de Geographical information systems officer continua con una bomba de alimentacin.  Las frmulas se deben Architectural technologist a Engineer, water.  Si la alimentacin se administra de manera continua, en la bolsa de alimentacin se debe colocar una cantidad suficiente para 4horas de alimentacin, pero no ms. Esto ayuda a Industrial/product designer o la infusin excesiva accidental.  Darcel Smalling y coloque la  frmula no Education officer, community.  Si la alimentacin es continua, suspndala cuando se administren medicamentos o inyecciones. No olvide retomar el proceso de alimentacin.  Las bolsas de alimentacin y Fish farm manager debern reemplazarse segn las indicaciones del mdico. ADMINISTRACIN DE MEDICAMENTOS  En general, para la administracin a travs del tubo G, es mejor si todos los medicamentos se encuentran en forma lquida. Es menos  probable que los medicamentos lquidos obstruyan el tubo G.  Mezcle los medicamentos lquidos con 43ml (o la cantidad recomendada por su mdico) de agua tibia.  Coloque los medicamentos en la [place].  Conecte la jeringa al tubo G y presione lentamente para que la mezcla ingrese en el tubo G.  Despus de [employer], tome 3ml de agua tibia con la [place] y purgue lentamente el tubo G.  Para pldoras o cpsulas, consulte a su mdico antes de [employer]. Algunas pldoras no son efectivas si estn trituradas. Algunas cpsulas son medicamentos de liberacin sostenida.  Si corresponde, triture la pldora o cpsula y mezcle con 74ml de agua tibia. Con la jeringa, introduzca el medicamento lentamente a travs del tubo y [employer] purgue el tubo con otros 40ml de agua del grifo. PROBLEMAS CON EL TUBO G El tubo se ha salido  Causa: Es posible que se haya salido accidentalmente.  Soluciones: [place] la abertura con una venda limpia y [place]. Llame a su mdico de inmediato. El tubo G debe volver a colocarse lo antes posible (en un plazo de 4horas) para que la abertura del tubo (tracto) no se cierre. El tubo G debe volver a colocarse en un establecimiento sanitario. Antes de volver a usar el tubo G, se debe realizar una radiografa para confirmar su ubicacin. Enrojecimiento, irritacin, dolor u USAA ftido alrededor del Engineer, manufacturing.  Causa: Puede estar causado por una prdida o una infeccin  Soluciones: Llame a su mdico de inmediato. Observa gran cantidad de prdida de lquido o secrecin similar al moco (una gran cantidad significa que empapa una gaza).  Causa: Hay muchas razones que podran provocar una prdida en el tubo G.  Soluciones: Llame a su mdico e infrmele la cantidad de lquido que pierde. Parece crecer piel o tejido cicatrizal en el lugar en que el tubo ingresa en la piel.  Causa: El tejido puede crecer alrededor del Environmental consultant  de insercin si el tubo G se mueve o se tira excesivamente.  Soluciones: Asegure el tubo con cinta adhesiva para evitar que se mueva excesivamente. Comunquese con su mdico. El tubo est obstruido  Causa: Medicamentos o alimentos muy espesos.  Soluciones: Trate de Scientist, research (physical sciences) tibia en el tubo con [place] grande. [place] intente introducir objetos para desobstruirlo. No fuerce el ingreso de lquido dentro del tubo G. Si no puede desobstruir el tubo, comunquese con su mdico de inmediato. Consejos prcticos  La posicin cabecera de la cama hace referencia a la posicin elevada de la parte superior del cuerpo de Arts administrator.  Cuando se administran medicamentos o un bolo alimenticio, Quarry manager la cabecera de la cama alta segn las indicaciones de su mdico. Haga esto durante la alimentacin y durante 1hora posterior a la alimentacin o la administracin de medicamentos.  Si la alimentacin es continua, es Engineer, site la cabecera de la cama alta segn las instrucciones de su mdico. Cuando se realizan actividades de la vida cotidiana y la cabecera de la cama debe estar en posicin horizontal, asegrese de apagar la bomba de alimentacin. Vuelva a  iniciar la bomba de alimentacin cuando pueda volver a Public affairs consultant cabecera de la cama a la posicin recomendada.  No ejerza presin ni tire del tubo.  Para prevenir el reflujo de lquidos, doble el tubo G antes de quitar la tapa o Sport and exercise psychologist.  Verifique la longitud del tubo G todos [place]. Mida desde el lugar de insercin hasta el extremo del tubo G. Si la longitud es mayor que las mediciones previas, es posible que el tubo se est saliendo. Llame a su mdico si observa que ha aumentado la longitud del tubo G.  No deje de lado su salud bucal, cepllese los dientes como de [place].  Es posible que deba extraer el exceso de aire (desobstruccin) del tubo G. Su mdico le indicar si esto es necesario.  Llame siempre a  su mdico si tiene preguntas o problemas con el tubo G. SOLICITE ATENCIN MDICA DE INMEDIATO SI:  Tiene un dolor abdominal intenso, sensibilidad o hinchazn abdominal (distensin).  Tiene nuseas o vmitos.  Padece estreimiento o problemas para defecar.  El lugar de la insercin del tubo G est enrojecido, hinchado, despide mal olor o supura un lquido amarillo o Forensic psychologist.  Tiene dificultad para respirar o Risk manager.  Tiene fiebre.  El tubo de alimentacin tiene gran cantidad de residuos.  El tubo G est obstruido y no puede purgarse. ASEGRESE DE QUE:  Comprende estas instrucciones.  Controlar su afeccin.  Recibir ayuda de inmediato si no mejora o si empeora. Esta informacin no tiene Marine scientist el consejo del mdico. Asegrese de hacerle al mdico cualquier pregunta que tenga. Document Released: 12/28/2004 Document Revised: 05/14/2014 Document Reviewed: 09/04/2012 Elsevier Interactive Patient Education  2017 [place] de la sonda de alimentacin (Care of a Feeding Tube) A las personas que tienen dificultad para tragar o no pueden tomar alimentos o medicamentos por va oral, se les coloca una sonda de alimentacin. La sonda de alimentacin se ingresa por la nariz hacia el [place] o a travs de la piel del abdomen hasta el estmago o el intestino delgado. Otros nombres para estas sondas de alimentacin son sondas de gastrostoma, lneas de PEG, sondas nasogstricas y [place]. ELEMENTOS NECESARIOS PARA EL CUIDADO DEL SITIO DE INSERCIN DEL TUBO  Guantes limpios.  Un pao limpio, apsitos de gasa o toallas de papel suaves.  Hisopos de algodn.  Una crema o ungento como barrera para la piel.  [place] y [place].  Apsitos con un precorte de gomaespuma o gasa (para rodear el tubo).  Adhesivo para el tubo. CUIDADOS DEL SITIO DE INSERCIN DEL TUBO 1. Tenga todos los materiales listos y disponibles. 2. Lvese bien las manos. 3. Pngase  guantes limpios. 4. Retire la almohadilla de gomaespuma o gasa sucia, si hubiere, que se encuentra bajo el estabilizador del tubo. Cambie la almohadilla diariamente cuando est sucia o hmeda. 5. Revise la piel que rodea el sitio de la sonda para observar si hay enrojecimiento, erupcin cutnea, hinchazn, drenaje o crecimiento anormal de tejido. Si nota alguno de estos signos, llame a su mdico. 6. Humedezca la gasa y los hisopos de algodn con agua y [place]. 7. Limpie la zona cercana al tubo (cerca del estoma) con hisopos de algodn. Limpie la piel circundante con una gasa humedecida. Enjuague con agua. 8. Seque la piel y el estoma con una gasa seca o una toalla de papel [place]. No use cremas con antibitico en el sitio de la sonda. 9. Si la piel est roja, aplique Ardelia Mems  crema o ungento como barrera (como la vaselina) con movimientos circulares, usando un hisopo de algodn. La crema o el ungento le proporcionar una barrera de humedad para la piel y ayudar a Management consultant herida. 10. Aplique una nueva almohadilla de gomaespuma precortada o una gasa alrededor del tubo. Sujete con cinta adhesiva alrededor [employer]. Si hay un drenaje, almohadillas de espuma o gasa se ??pueden sacar. 11. Use cinta o un dispositivo de anclaje para sujetar el tubo de alimentacin a la piel para mayor comodidad o segn las indicaciones. Rote el lugar de fijacin del tubo para evitar daos en la piel con Beverley Fiedler. 12. Coloque a la persona en una posicin semi-vertical (ngulo de 30-45 grados). 13. Deseche todos los materiales usados. 14. Qutese los guantes. [place]. ELEMENTOS NECESARIOS PARA PURGAR LA SONDA DE ALIMENTACIN  Guantes limpios.  Ardelia Mems jeringa de 60 mL (que se conecta a la sonda de alimentacin).  Toalla.  [place]. CMO PURGAR LA SONDA DE ALIMENTACIN 1. Tenga todos los materiales listos y disponibles. 2. Lvese bien las manos. 3. Pngase guantes limpios. 4. Tome 30 mL de agua con la  [place]. 5. Doble la sonda de alimentacin mientras la desconecta de la tubera de la bolsa o mientras quita el tapn en el extremo de la sonda. El doblez cierra el tubo e impide que se viertan las secreciones. 6. Inserte la punta de la jeringa en el extremo de la sonda de alimentacin. Libere el doblez. Inyecte lentamente el agua. 7. Si no puede inyectar el agua, la persona que tiene el tubo de alimentacin debe recostarse sobre su lado izquierdo. La punta del tubo puede quedar contra la pared del estmago, obstruyendo el flujo de lquido. El cambio de posicin puede apartar la punta del la pared del estmago. Luego de [employer] posicin, trate de Designer, jewellery agua nuevamente.  No utilice una jeringa ms pequea que 60 ml para enjuagar el tubo.  No haga demasiada fuerza para superar la resistencia debido a que esto podra hacer que el tubo se rompa. 8. Despus de [employer], retire la [place]. 9. Enjuague siempre la sonda antes de Licensed conveyancer, entre medicamentos y despus de finalizar los medicamentos, antes de Chiropodist. Esto impide que los medicamentos obstruyan la sonda.  No mezcle medicamentos con otros medicamentos antes de administrarlos.  Enjuague bien los medicamentos a travs del tubo de [place] que no se mezclen con los alimentos lquidos. 10. Deseche todos los materiales usados. 11. Qutese los guantes. [place]. Esta informacin no tiene Marine scientist el consejo del mdico. Asegrese de hacerle al mdico cualquier pregunta que tenga. Document Released: 06/17/2009 Document Revised: 12/15/2011 Document Reviewed: 08/12/2011 Elsevier Interactive Patient Education  2017 [place] perfusin implantable: gua para Engineer, mining (Implanted [employer]) Un dispositivo de perfusin implantable es un tipo de va central que se coloca debajo de la piel. Las vas centrales se usan para proporcionar un Games developer (IV) cuando es necesario administrarle un tratamiento o nutricin a Ardelia Mems persona a travs de las venas. Los dispositivos de perfusin implantables se usan para proporcionar un acceso intravenoso a Barrister's clerk. Un dispositivo de perfusin implantable se puede colocar por los siguientes motivos:  Necesita administrarse un medicamento intravenoso que podra provocar una irritacin en las venas pequeas de las manos o los brazos.  Necesita medicamentos por va intravenosa a largo plazo, como antibiticos.  Necesita recibir nutricin por va intravenosa durante  un largo perodo.  Es necesario que le extraigan sangre con frecuencia para Optometrist anlisis de laboratorio.  Debe someterse a dilisis. Los dispositivos de perfusin implantables generalmente se colocan en la zona del pecho, pero tambin se pueden colocar en la parte superior del brazo, el abdomen o la pierna. Un dispositivo de perfusin implantable tiene dos partes principales:  Depsito. El depsito es redondo y Lexicographer como una zona pequea y prominente en la piel. El depsito es la parte en donde se inserta la aguja para Architectural technologist los medicamentos o Merchant navy officer.  Catter. El catter es un tubo delgado y flexible que se extiende desde el depsito. El catter se coloca en una vena grande. Los medicamentos que se introducen en el depsito, pasan a travs del catter y luego llegan a la vena. [place]? No humedezca el lugar de la incisin. Bese o dchese segn las indicaciones del mdico. CMO SE ACCEDE AL DISPOSITIVO DE PERFUSIN? Para acceder al dispositivo debern seguirse algunos pasos especiales.  Antes del procedimiento, podr colocarse una crema con anestesia sobre la piel. Esto hace adormecer la piel que se encuentra sobre el dispositivo.  Para acceder al dispositivo el mdico usar una tcnica estril.  El mdico debe colocarse Judene Companion y guantes estriles.  La piel  alrededor del dispositivo se limpia cuidadosamente con un antisptico y se Scientist, research (life sciences).  Para introducir la aguja en el dispositivo este se debe sostener suavemente entre los guantes estriles.  Slo deben utilizarse agujas non.coring (que no dejan agujero) para acceder al dispositivo. Una vez que se ha accedido al dispositivo, deber controlarse el retorno de Herbalist. Esto ayuda a Forensic scientist dispositivo est en la vena y no est obstruido.  Si es necesario acceder al dispositivo de manera constante para una infusin, se colocar un apsito transparente por encima de la zona de la aguja. El apsito y la aguja debern cambiarse todas las semanas o segn las indicaciones del mdico.  Mantenga limpio y seco el apsito que cubre la [place]. No deje que se humedezca. Siga las indicaciones del mdico sobre cmo debe tomar un bao o una ducha mientras est expuesto el acceso al dispositivo.  Si no es necesario acceder al dispositivo de manera constante, no es Paramedic un apsito. QU ES EL PURGADO? El purgado ayuda a que el dispositivo no se [place]. Siga las indicaciones del mdico sobre cmo y cundo debe purgar el dispositivo. Los dispositivos de perfusin generalmente se purgan con una solucin salina o un medicamento llamado heparina. La necesidad de Corporate treasurer de cmo se use el dispositivo.  Si el dispositivo se [place] para administrar medicamentos o extraer sangre de manera intermitente, deber purgarse de la siguiente manera:  Despus de la administracin de los medicamentos.  Luego de [place] extraccin de [place].  Como parte de una rutina de Theatre manager.  Si la infusin es constante, no necesitar limpiarlo. [place]? El dispositivo puede permanecer implantado por el tiempo que el mdico considere necesario. Cuando llega el momento de retirar el dispositivo, deber someterse a [place]. El procedimiento es similar al que  se realiz para colocarlo. [place]? Cuando tenga un dispositivo de perfusin implantado, debe buscar atencin mdica de inmediato en los siguientes casos:  Advierte un olor ftido que proviene del lugar de la incisin.  Observa hinchazn, eritemas o secrecin en el lugar de la incisin.  Observa ms hinchazn o siente  ms dolor en la zona del dispositivo o a su alrededor.  Tiene fiebre que no puede controlar con [employer]. Esta informacin no tiene Marine scientist el consejo del mdico. Asegrese de hacerle al mdico cualquier pregunta que tenga. Document Released: 10/25/2008 Document Revised: 10/18/2012 Document Reviewed: 09/04/2012 Elsevier Interactive Patient Education  2017 [place] dispositivo de perfusin implantable: cuidados posteriores (Implanted Port Insertion, Care After) Siga estas instrucciones durante las prximas semanas. Estas indicaciones le proporcionan informacin general acerca de cmo deber cuidarse despus del procedimiento. El mdico tambin podr darle instrucciones ms especficas. El tratamiento ha sido planificado segn las prcticas mdicas actuales, pero en algunos casos pueden ocurrir problemas. Comunquese con el mdico si tiene algn problema o tiene dudas despus del procedimiento. QU ESPERAR DESPUS DEL PROCEDIMIENTO Despus del procedimiento, es comn lo siguiente:  Molestias en el sitio de la insercin del dispositivo. Colocar compresas de hielo en la zona ayudar.  Hematomas en la piel alrededor del dispositivo, los cuales disminuirn luego de 3 o 4 das. INSTRUCCIONES PARA EL CUIDADO EN EL HOGAR  Luego que le coloquen el dispositivo, le darn una tarjeta de informacin del fabricante. La tarjeta contiene informacin acerca del dispositivo. Llvela siempre con usted.  Sepa qu tipo de dispositivo de perfusin implantable tiene. Hay diferentes tipos de dispositivos de perfusin  implantables.  Use un brazalete de alerta mdico en caso de emergencia. Esto ayudar a Theatre stage manager a los mdicos que usted tiene un dispositivo de perfusin implantable.  El dispositivo puede permanecer implantado por el tiempo que el mdico considere necesario.  Posiblemente un enfermero vaya a su domicilio para administrarle los medicamentos y cuidar del dispositivo.  Usted o un familiar pueden recibir instrucciones y Engineer, building services para Architectural technologist los medicamentos y cuidar del dispositivo en Engineer, mining. SOLICITE ATENCIN MDICA SI:  El dispositivo no funciona o no puede hacer que retorne Herbalist.  Siente escalofros o fiebre. SOLICITE ATENCIN MDICA DE INMEDIATO SI:  Observa un lquido nuevo o pus en la incisin.  Advierte un olor ftido que proviene del lugar de la incisin.  Observa hinchazn, ms eritemas o siente dolor en el lugar de la incisin o alrededor del dispositivo.  Siente falta de aire o Tourist information centre manager. Esta informacin no tiene Marine scientist el consejo del mdico. Asegrese de hacerle al mdico cualquier pregunta que tenga. Document Released: 10/18/2012 Document Revised: 01/02/2013 Document Reviewed: 09/04/2012 Elsevier Interactive Patient Education  2017 [place] consciente moderada en los adultos (Moderate Conscious Sedation, Adult) La sedacin es el uso de un medicamento para favorecer la relajacin y Runner, broadcasting/film/video y la ansiedad. La sedacin consciente moderada es un tipo de sedacin. Cuando se somete a una sedacin consciente moderada, disminuye su nivel de alerta habitual, pero igualmente puede responder a las instrucciones o al tacto, o a ambos. La sedacin consciente moderada se [place] durante procedimientos mdicos y dentales breves. Es ms leve que una sedacin profunda, que es un tipo de sedacin de la que no es fcil despertarse. Tambin es ms leve que la anestesia general, que es el uso de medicamentos para dejarlo  inconsciente. La sedacin consciente moderada le permite volver a sus actividades habituales ms pronto. INFORME A SU MDICO:  Cualquier alergia que tenga.  Todos los [employer], incluidos vitaminas, hierbas, gotas oftlmicas, cremas y medicamentos de venta libre.  Uso de corticoides (por va oral o cremas).  Cualquier problema que usted o sus familiares hayan tenido con sedantes y anestsicos.  Enfermedades de la sangre que tenga.  Cirugas previas.  Cualquier afeccin que padezca, como apnea del sueo.  Si est embarazada o podra estarlo.  Consumo de cigarrillos, alcohol, marihuana o drogas. RIESGOS Y COMPLICACIONES En general, se trata de un procedimiento seguro. Sin embargo, pueden presentarse problemas, por ejemplo:  Recibir demasiado medicamento (sedacin excesiva).  Nuseas.  Reaccin alrgica a un medicamento.  Problemas para respirar. Si esto ocurre, es posible que se utilice un tubo respiratorio para ayudarlo a Ambulance person. El tubo se retirar cuando despierte y respire por su cuenta.  Problemas cardacos.  Problemas pulmonares. ANTES DEL PROCEDIMIENTO Mantenerse hidratado  Siga las indicaciones del mdico acerca de la hidratacin, las cuales pueden incluir lo siguiente:  Hasta 2horas antes del procedimiento, puede beber lquidos transparentes, como agua, jugos frutales transparentes, caf negro y t solo. Restricciones en las comidas y bebidas  [place] comidas y las bebidas, las cuales pueden incluir lo siguiente:  Ocho horas antes del procedimiento, deje de ingerir comidas o alimentos pesados, por ejemplo, carne, alimentos fritos o alimentos grasos.  Seis horas antes del procedimiento, deje de ingerir comidas o alimentos livianos, como tostadas o cereales.  Seis horas antes del procedimiento, deje de beber [place] o bebidas que [employer].  Dos horas antes del procedimiento, deje de beber lquidos  transparentes. Medicamentos  Consulte al mdico si debe hacer o no lo siguiente:  Quarry manager o suspender los medicamentos que toma habitualmente. Esto es muy importante si toma medicamentos para la diabetes o anticoagulantes.  Tomar medicamentos como aspirina e ibuprofeno. Estos medicamentos pueden tener un efecto anticoagulante en la [place]. No tome estos medicamentos antes del procedimiento si el mdico le indica que no lo haga. Pruebas y exmenes   Le harn un examen fsico.  Posiblemente deba realizarse anlisis de sangre para evaluar lo siguiente:  Cmo estn funcionando los riones y Engineer, maintenance (IT).  Con qu eficacia coagula la sangre. Instrucciones generales   Haga planes para que una persona lo lleve a su casa desde el hospital o la clnica.  Si se ir a su casa inmediatamente despus del procedimiento, planifique que alguien se quede con usted durante 24horas. PROCEDIMIENTO  Le colocarn una va intravenosa (IV) en una de las venas.  Se le administrar un medicamento para ayudarlo a que se relaje (sedante) a travs de la va intravenosa.  Se realizar el procedimiento mdico o dental. DESPUS DEL PROCEDIMIENTO  Le controlarn con frecuencia la presin arterial, la frecuencia cardaca, la frecuencia respiratoria y Retail buyer de oxgeno en la sangre hasta que haya desaparecido el efecto de los medicamentos administrados.  No conduzca durante 24horas. Esta informacin no tiene Marine scientist el consejo del mdico. Asegrese de hacerle al mdico cualquier pregunta que tenga. Document Released: 10/07/2004 Document Revised: 01/18/2014 Document Reviewed: 04/19/2015 Elsevier Interactive Patient Education  2017 [place] consciente moderada en los adultos, cuidados posteriores (Moderate Conscious Sedation, Adult, Care After) Estas indicaciones le proporcionan informacin acerca de cmo deber cuidarse despus del procedimiento. El mdico tambin podr darle  instrucciones ms especficas. El tratamiento ha sido planificado segn las prcticas mdicas actuales, pero en algunos casos pueden ocurrir problemas. Comunquese con el mdico si tiene algn problema o dudas despus del procedimiento. QU ESPERAR DESPUS DEL PROCEDIMIENTO Despus del procedimiento, es comn:  Sentirse somnoliento durante varias horas.  Sentirse torpe y [employer] de equilibrio durante varias horas.  Perder el sentido de la realidad durante varias horas.  Vomitar si come  muy pronto. INSTRUCCIONES PARA EL CUIDADO EN EL HOGAR  Durante al menos 24horas despus del procedimiento:   No haga lo siguiente:  Participar en actividades que impliquen posibles cadas o lesiones.  Conducir vehculos.  Operar maquinarias pesadas.  Beber alcohol.  Tomar somnferos o medicamentos que causen somnolencia.  Firmar documentos legales ni tomar [employer].  Cuidar a nios por su cuenta.  Hacer reposo. Comida y bebida   Siga la dieta recomendada por el mdico.  Si vomita:  Pruebe agua, jugo o sopa cuando usted pueda beber sin vomitar.  Asegrese de no tener nuseas antes de ingerir alimentos slidos. Instrucciones generales   Permanezca con un adulto responsable hasta que est completamente despierto y consciente.  Tome los medicamentos de venta libre y los recetados solamente como se lo haya indicado el mdico.  Si fuma, no lo haga sin supervisin.  Concurra a todas las visitas de control como se lo haya indicado el mdico. Esto es importante. SOLICITE ATENCIN MDICA SI:  Sigue teniendo nuseas o vomitando.  Tiene sensacin de desvanecimiento.  Le aparece una erupcin cutnea.  Tiene fiebre. SOLICITE ATENCIN MDICA DE INMEDIATO SI:  Tiene dificultad para respirar. Esta informacin no tiene Marine scientist el consejo del mdico. Asegrese de hacerle al mdico cualquier pregunta que tenga. Document Released: 01/02/2013 Document Revised:  01/18/2014 Document Reviewed: 04/19/2015 Elsevier Interactive Patient Education  2017 [employer].

## 2016-02-11 NOTE — Progress Notes (Signed)
Patient ID: Heather Strickland, female   DOB: 1975/04/26, 41 y.o.   MRN: [id number] Patient given prescription for Percocet 5/325, #20, no refills, 1-2 tablets every 4-6 hours as needed for moderate to severe pain, status post percutaneous gastrostomy tube placement today.

## 2016-02-11 NOTE — Sedation Documentation (Signed)
Dr. Pascal Lux out of room.

## 2016-02-11 NOTE — Consult Note (Signed)
Chief Complaint: Patient was seen in consultation today for Port-A-Cath and percutaneous gastrostomy tube placements  Referring Physician(s): Penland,Shannon K  Supervising Physician: Sandi Mariscal  Patient Status: [facility] - Out-pt  History of Present Illness: Heather Strickland is a 41 y.o. 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  Past Medical History:  Diagnosis Date  . Anemia   . GERD (gastroesophageal reflux disease)   . Squamous cell carcinoma of left tonsil ([place]) 01/27/2016    Past Surgical History:  Procedure Laterality Date  . CESAREAN SECTION  [id number]  . ESOPHAGOGASTRODUODENOSCOPY N/A 11/07/2015   Dr. Gala Romney: normal esophagus, chronic gastritis     Allergies: Patient has no known allergies.  Medications: Prior to Admission medications   Medication Sig Start Date End Date Taking? Authorizing Provider  ferrous sulfate 325 (65 FE) MG tablet Take 1 tablet (325 mg total) by mouth daily. 02/18/14  Yes Lomas, NP  pantoprazole (PROTONIX) 40 MG tablet Take 1 tablet (40 mg total) by mouth 2 (two) times daily before a meal. Tome una tableta por boca diaria 12/22/15  Yes Annitta Needs, NP  levonorgestrel (MIRENA) 20 MCG/24HR IUD 1 each by Intrauterine route once.    Historical Provider, MD     Family History  Problem Relation Age of Onset  . Diabetes Mother   . Hypertension Mother   . Prostatitis Father   . ADD / ADHD Daughter   . Cerebral palsy Son     Social History   Social History  . Marital status: Single    Spouse name: N/A  . Number of children: N/A  . Years of education: N/A   Social History Main Topics  . Smoking status: Never Smoker  . Smokeless tobacco: Never Used  . Alcohol use No  . Drug use: No  . Sexual activity: Not Currently    Birth control/ protection:  None, IUD   Other Topics Concern  . None   Social History Narrative  . None      Review of Systems currently denies fever, cough, back pain, nausea, vomiting or abnormal bleeding. She does have intermittent headaches / pressure, occasional dyspnea, occasional chest pressure/abdominal pain, occasional sore throat. She denies dysphagia.  Vital Signs: Blood pressure 127/87, heart rate 85, temperature 98.3, respirations 18, O2 sats 99% room air    Physical Exam awake, alert. Chest clear to auscultation bilaterally. Heart with regular rate and rhythm. Abdomen soft, positive bowel sounds, nontender. Lower extremities -no edema.  Mallampati Score:     Imaging: Ct Soft Tissue Neck W Contrast  Result Date: 01/30/2016 CLINICAL DATA:  41 y/o F; left tonsil squamous cell carcinoma for staging. EXAM: CT NECK WITH CONTRAST TECHNIQUE: Multidetector CT imaging of the neck was performed using the standard protocol following the bolus administration of intravenous contrast. CONTRAST:  127mL ISOVUE-300 IOPAMIDOL (ISOVUE-300) INJECTION 61% COMPARISON:  None. FINDINGS: Pharynx and larynx: There is asymmetric enlargement of the left palatini tonsil with soft tissue thickening extending superiorly to the uvula and the left aspect of the soft palate up to the margin of the soft palate and hard palate (series 9, image 59 and series 10, image 42). Inferiorly soft tissue thickening occupies the left lateral, anterior, and posterior aspects of the pharyngeal and hypopharyngeal wall. The inferior most extent of soft tissue thickening appears to reach the margin of the supraglottis without invasion  of the supraglottic or glottic structures (series 10, image 56 and series 7, image 39). Anteriorly there is effacement of the left glossopharyngeal sulcus but no appreciable invasion of the base of tongue. There is no effacement of parapharyngeal or prevertebral fat. Salivary glands: No inflammation, mass, or stone. Thyroid:  Normal. Lymph nodes: Lymph nodes in the left-sided 2 and 3 cervical lymph node levels are mildly asymmetrically enlarged in comparison with the contralateral neck (coronal image 61 and 65). The largest lymph node is the jugular digastric node the which measures 14 x 12 x 26 mm (AP x ML x CC) series 7, image 36 and series 10, image 61. The lymph nodes are well circumscribed without evidence for necrosis or extranodal extension. Vascular: Negative. Limited intracranial: Negative. Visualized orbits: Negative. Mastoids and visualized paranasal sinuses: Clear. Skeleton: No acute or aggressive process. Upper chest: Negative. Other: None. IMPRESSION: Motion artifact at the level of oropharynx and base of tongue. Fine soft tissue details lost at these levels. Pharyngeal mass centered in the left palatini tonsil with surround mucosal thickening as described below probably representing neoplasm. Mucosal thickening of the left aspect of hypopharynx and pharynx with inferior most extension to the margin of the supraglottic airway without appreciable invasion into epiglottis, aryepiglottic folds, vocal cords, or paraglottic fat. Superior most extension of mucosal thickening to the left aspect of the uvula and along the left aspect of the soft palate to the hard/soft palate junction, this region is obscured by motion artifact. Anteriorly there is effacement of left glossopharyngeal sulcus without appreciable tongue base invasion, this region is obscured by motion artifact. Asymmetric enlargement of left-sided level 2 and 3 cervical lymph nodes possibly metastatic. No evidence for lymph node necrosis or extra nodal extension. Electronically Signed   By: Kristine Garbe M.D.   On: 01/30/2016 00:40   Ct Chest W Contrast  Result Date: 01/30/2016 CLINICAL DATA:  Left tonsillar cancer, for staging EXAM: CT CHEST WITH CONTRAST TECHNIQUE: Multidetector CT imaging of the chest was performed during intravenous contrast  administration. CONTRAST:  131mL ISOVUE-300 IOPAMIDOL (ISOVUE-300) INJECTION 61% COMPARISON:  None. FINDINGS: Cardiovascular: Heart is normal in size.  No pericardial effusion. No evidence of thoracic aortic aneurysm. Mediastinum/Nodes: No suspicious mediastinal lymphadenopathy. Triangular soft tissue in the anterior mediastinum (series 2/image 30) likely reflects residual thymus. Visualized thyroid is unremarkable. Lungs/Pleura: No suspicious pulmonary nodules. Calcified granuloma in the superior segment right lower lobe (series 3/ image 50), benign. No focal consolidation. No pleural effusion or pneumothorax. Upper Abdomen: Visualized upper abdomen is notable for a 7 mm cyst in the inferior right hepatic lobe (series 2/ image 137). Musculoskeletal: Visualized osseous structures are within normal limits. IMPRESSION: No evidence of metastatic disease in the chest. Electronically Signed   By: Julian Hy M.D.   On: 01/30/2016 07:47   Nm Pet Image Initial (pi) Skull Base To Thigh  Result Date: 02/06/2016 CLINICAL DATA:  Initial treatment strategy for left tonsillar squamous cell carcinoma. EXAM: NUCLEAR MEDICINE PET SKULL BASE TO THIGH TECHNIQUE: 11.9 mCi F-18 FDG was injected intravenously. Full-ring PET imaging was performed from the skull base to thigh after the radiotracer. CT data was obtained and used for attenuation correction and anatomic localization. FASTING BLOOD GLUCOSE:  Value: 93 mg/dl COMPARISON:  CT neck from 01/29/2016 FINDINGS: NECK The left palatine tonsillar mass shown on recent CT has a maximum SUV of 38.5. A left station IIa lymph node measures 1.1 cm in short axis on image 38/3 but maximum SUV is only  3.5. Contralateral structures in this vicinity have a maximum SUV of 3.0 and accordingly this node is only very minimally above the contralateral activity. CHEST No hypermetabolic mediastinal or hilar nodes. No suspicious pulmonary nodules on the CT scan. There is a calcified granuloma in  the superior segment right lower lobe without associated metabolic activity. Mild cardiomegaly. ABDOMEN/PELVIS No abnormal hypermetabolic activity within the liver, pancreas, adrenal glands, or spleen. No hypermetabolic lymph nodes in the abdomen or pelvis. Incidental note is made of an IUD satisfactorily positioned in the endometrial canal. SKELETON No focal hypermetabolic activity to suggest skeletal metastasis. IMPRESSION: 1. Highly hypermetabolic left palatine tonsillar mass, maximum SUV 38.5. 2. A left station IIa lymph node measure 1.1 cm in short axis, mildly enlarged on image 38/3, but has only a borderline elevated SUV at 3.5. 3. Other imaging findings of potential clinical significance: Mild cardiomegaly. Calcified granuloma in the superior segment right lower lobe (benign). IUD satisfactorily positioned in the uterus. Electronically Signed   By: Van Clines M.D.   On: 02/06/2016 12:17    Labs:  CBC:  Recent Labs  06/19/15 1147 10/29/15 1124 01/27/16 1739 02/11/16 0744  WBC  --   --  7.2 6.1  HGB 12.8 12.5 12.9 12.5  HCT 38.8  --  37.4 36.6  PLT  --   --  290 240    COAGS:  Recent Labs  02/11/16 0744  INR 0.96  APTT 39*    BMP:  Recent Labs  01/27/16 1739 02/11/16 0744  NA 135 138  K 3.8 3.8  CL 101 104  CO2 28 25  GLUCOSE 101* 98  BUN 14 12  CALCIUM 9.4 9.4  CREATININE 0.65 0.58  GFRNONAA >60 >60  GFRAA >60 >60    LIVER FUNCTION TESTS:  Recent Labs  01/27/16 1739  BILITOT 0.6  AST 17  ALT 15  ALKPHOS 60  PROT 7.9  ALBUMIN 4.5    TUMOR MARKERS: No results for input(s): AFPTM, CEA, CA199, CHROMGRNA in the last 8760 hours.  Assessment and Plan: 42 y.o. 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 Details/risks of procedures, including but not limited to, internal  bleeding, infection, injury to adjacent structures discussed with patient and husband via interpreter with their understanding and consent.    Thank you for this interesting consult.  I greatly enjoyed meeting Heather Strickland and look forward to participating in their care.  A copy of this report was sent to the requesting provider on this date.  Electronically Signed: D. Rowe Robert 02/11/2016, 8:52 AM   I spent a total of  30 minutes   in face to face in clinical consultation, greater than 50% of which was counseling/coordinating care for Port-A-Cath and percutaneous gastrostomy tube placements

## 2016-02-12 ENCOUNTER — Encounter (HOSPITAL_COMMUNITY): Payer: Self-pay | Admitting: Hematology & Oncology

## 2016-02-13 ENCOUNTER — Encounter: Payer: Self-pay | Admitting: Radiation Oncology

## 2016-02-13 ENCOUNTER — Encounter (HOSPITAL_COMMUNITY): Payer: Self-pay

## 2016-02-13 ENCOUNTER — Ambulatory Visit
Admission: RE | Admit: 2016-02-13 | Discharge: 2016-02-13 | Disposition: A | Discharge status: Home / Self Care | Payer: Self-pay | Source: Ambulatory Visit | Attending: Radiation Oncology | Admitting: Radiation Oncology

## 2016-02-13 ENCOUNTER — Encounter (HOSPITAL_COMMUNITY): Payer: Self-pay | Admitting: Dentistry

## 2016-02-13 ENCOUNTER — Ambulatory Visit (HOSPITAL_COMMUNITY): Payer: Self-pay | Admitting: Dentistry

## 2016-02-13 ENCOUNTER — Encounter: Payer: Self-pay | Admitting: *Deleted

## 2016-02-13 VITALS — BP 106/67 | HR 61 | Temp 97.8°F

## 2016-02-13 VITALS — BP 114/63 | HR 71 | Temp 98.9°F | Ht 65.0 in | Wt 159.6 lb

## 2016-02-13 DIAGNOSIS — K0530 Chronic periodontitis, unspecified: Secondary | ICD-10-CM

## 2016-02-13 DIAGNOSIS — K06020 Generalized gingival recession, unspecified: Secondary | ICD-10-CM

## 2016-02-13 DIAGNOSIS — Z931 Gastrostomy status: Secondary | ICD-10-CM | POA: Insufficient documentation

## 2016-02-13 DIAGNOSIS — M2630 Unspecified anomaly of tooth position of fully erupted tooth or teeth: Secondary | ICD-10-CM

## 2016-02-13 DIAGNOSIS — M264 Malocclusion, unspecified: Secondary | ICD-10-CM

## 2016-02-13 DIAGNOSIS — C099 Malignant neoplasm of tonsil, unspecified: Secondary | ICD-10-CM | POA: Insufficient documentation

## 2016-02-13 DIAGNOSIS — K036 Deposits [accretions] on teeth: Secondary | ICD-10-CM

## 2016-02-13 DIAGNOSIS — K08409 Partial loss of teeth, unspecified cause, unspecified class: Secondary | ICD-10-CM

## 2016-02-13 DIAGNOSIS — K219 Gastro-esophageal reflux disease without esophagitis: Secondary | ICD-10-CM | POA: Insufficient documentation

## 2016-02-13 DIAGNOSIS — K029 Dental caries, unspecified: Secondary | ICD-10-CM

## 2016-02-13 DIAGNOSIS — I517 Cardiomegaly: Secondary | ICD-10-CM | POA: Insufficient documentation

## 2016-02-13 DIAGNOSIS — K06010 Localized gingival recession, unspecified: Secondary | ICD-10-CM

## 2016-02-13 DIAGNOSIS — Z01818 Encounter for other preprocedural examination: Secondary | ICD-10-CM

## 2016-02-13 DIAGNOSIS — M2635 Rotation of fully erupted tooth or teeth: Secondary | ICD-10-CM

## 2016-02-13 DIAGNOSIS — R634 Abnormal weight loss: Secondary | ICD-10-CM

## 2016-02-13 NOTE — Progress Notes (Addendum)
Radiation Oncology         [phone] ________________________________  Initial Outpatient Consultation  Name: Tymia Streb MRN: [id number]  Date: 02/13/2016  DOB: 05-Feb-1975  [place]:OMBTDHR McElroy, PA-C  Penland, Kelby Fam, MD   REFERRING PHYSICIAN: Patrici Ranks, MD  DIAGNOSIS:    ICD-9-CM ICD-10-CM   1. 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 female who presented with a 5-6 month history of severe sore throat. She was initially referred to Dr. Benjamine Mola by [name], NP. The patient never sought treatment for this issue before. On physical exam by Dr. Benjamine Mola, he noted 3+ ulceration and erythema on the left tonsil.  On flexible laryngoscopy, tonsillar hypertrophy with ulceration was noted on the left.    Biopsy of Left Tonsil on 01/15/16 with Dr. Benjamine Mola revealed: poorly differentiated carcinoma consistent with invasive squamous cell carcinoma, HPV positive.  Pertinent imaging thus far includes neck CT performed on 01/30/16 revealing a pharyngeal mass centered in the left palatini tonsil with surrounding mucosal thickening as described below probably representing neoplasm. Mucosal thickening of the left aspect of hypopharynx and pharynx with inferior-most extension to the margin of the supraglottic airway without appreciable invasion into epiglottis, aryepiglottic folds, vocal cords, or paraglottic fat. Superior most extension of mucosal thickening to the left aspect  of the uvula and along the left aspect of the soft palate to the hard/soft palate junction noted. Anteriorly there is effacement of left glossopharyngeal sulcus without appreciable tongue base invasion. Asymmetric enlargement of left-sided level 2 and 3 cervical lymph nodes possibly metastatic. No evidence for lymph node necrosis or extra nodal extension.  PET scan on 02/06/16 shows highly hypermetabolic left palatine tonsillar mass as well as a left station IIA lymph node measure 1.1 cm in short axis, mildly enlarged.  I have reviewed the images.  The patient has met with Dr. Whitney Muse to discuss systemic treatment, and is referred to our clinic today to discuss the role that radiation may play in the treatment of her disease. The patient was offered a Optometrist, but has elected for her daughter-in-law to translate during this encounter. We are joined during this encounter by Gayleen Orem, RN, Head and Neck Nurse Navigator.  On review of systems, the patient reports she has lost a few pounds over the past few weeks. She denies swallowing issues at this time, though the patient has met with Swallowing Therapy at [facility]. She reports some soreness in her throat. The patient had a PEG placed 02/11/16. She is planned to receive chemotherapy at [facility] with Dr. Whitney Muse.  PREVIOUS RADIATION THERAPY: No  PAST MEDICAL HISTORY:  has a past medical history of Anemia; GERD (gastroesophageal reflux disease); and Squamous cell carcinoma of left tonsil ([place]) (01/27/2016).    PAST SURGICAL HISTORY: Past Surgical History:  Procedure Laterality Date  . CESAREAN SECTION  [id number]  . ESOPHAGOGASTRODUODENOSCOPY N/A 11/07/2015   Dr. Gala Romney: normal esophagus, chronic gastritis   . IR GENERIC HISTORICAL  02/11/2016   IR FLUORO  GUIDE PORT INSERTION RIGHT 02/11/2016 Sandi Mariscal, MD [facility]  . IR GENERIC HISTORICAL  02/11/2016   IR US GUIDE VASC ACCESS RIGHT 02/11/2016 Sandi Mariscal, MD [facility]  . IR GENERIC  HISTORICAL  02/11/2016   IR GASTROSTOMY TUBE MOD SED 02/11/2016 Sandi Mariscal, MD [facility]    FAMILY HISTORY: family history includes ADD / ADHD in her daughter; Cerebral palsy in her son; Diabetes in her mother; Hypertension in her mother; Prostatitis in her father.  SOCIAL HISTORY:  reports that she has never smoked. She has never used smokeless tobacco. She reports that she does not drink alcohol or use drugs. The patient lives in [place].  ALLERGIES: Patient has no known allergies.  MEDICATIONS:  Current Outpatient Prescriptions  Medication Sig Dispense Refill  . ferrous sulfate 325 (65 FE) MG tablet Take 1 tablet (325 mg total) by mouth daily. 30 tablet 0  . levonorgestrel (MIRENA) 20 MCG/24HR IUD 1 each by Intrauterine route once.    . Multiple Vitamins-Minerals (MULTIVITAMIN WITH MINERALS) tablet Take 1 tablet by mouth daily.    Marland Kitchen oxyCODONE-acetaminophen (PERCOCET/ROXICET) 5-325 MG tablet Take 1 tablet by mouth every 4 (four) hours as needed for severe pain.    . pantoprazole (PROTONIX) 40 MG tablet Take 1 tablet (40 mg total) by mouth 2 (two) times daily before a meal. Tome una tableta por boca diaria 60 tablet 3   No current facility-administered medications for this encounter.     REVIEW OF SYSTEMS:  A 10+ POINT REVIEW OF SYSTEMS WAS OBTAINED including neurology, dermatology, psychiatry, cardiac, respiratory, lymph, extremities, GI, GU, Musculoskeletal, constitutional, breasts, reproductive, HEENT.  All pertinent positives are noted in the HPI.  All others are negative.   PHYSICAL EXAM:  height is _0  (1.651 m) and weight is 159 lb 9.6 oz (72.4 kg). Her temperature is 98.9 F (37.2 C). Her blood pressure is 114/63 and her pulse is 71. Her oxygen saturation is 98%.   General: Alert and oriented, in no acute distress. HEENT: EOMI. Oral cavity demonstrates no thrush, and mucous membranes are clear. Grossly teeth are in good repair. She has a left tonsil tumor which is on external  exam approximately 2 cm in greatest dimension from what can be appreciated. It does not cross midline. Tongue is midline. Vascular: PAC is placed in right upper chest. Neck: No obvious palpable lymphadenopathy in the cervical or supraclavicular regions. Heart: Regular in rate and rhythm with no murmurs. Chest: Clear to auscultation bilaterally. Abdomen: PEG tube intact with clean dressings. Abdomen was not palpated at this time. Extremities: No edema in extremities. Lymphatics: see Neck Exam Skin: No concerning lesions. Musculoskeletal: symmetric strength throughout. Neurologic: EOMI. Rapidly alternating movements are intact.  ECOG = 1  0 - Asymptomatic (Fully active, able to carry on all predisease activities without restriction)  1 - Symptomatic but completely ambulatory (Restricted in physically strenuous activity but ambulatory and able to carry out work of a light or sedentary nature. For example, light housework, office work)  2 - Symptomatic, <50% in bed during the day (Ambulatory and capable of all self care but unable to carry out any work activities. Up and about more than 50% of waking hours)  3 - Symptomatic, >50% in bed, but not bedbound (Capable of only limited self-care, confined to bed or chair 50% or more of waking hours)  4 - Bedbound (Completely disabled. Cannot carry on any self-care. Totally confined to bed or chair)  5 - Death   Eustace Pen  MM, Creech RH, Tormey DC, et al. [id number]). "Toxicity and response criteria of the [facility]". [place] Oncol. 5 (6): 649-55   LABORATORY DATA:  Lab Results  Component Value Date   WBC 6.1 02/11/2016   HGB 12.5 02/11/2016   HCT 36.6 02/11/2016   MCV 89.7 02/11/2016   PLT 240 02/11/2016   CMP     Component Value Date/Time   NA 138 02/11/2016 0744   K 3.8 02/11/2016 0744   CL 104 02/11/2016 0744   CO2 25 02/11/2016 0744   GLUCOSE 98 02/11/2016 0744   BUN 12 02/11/2016 0744   CREATININE 0.58  02/11/2016 0744   CALCIUM 9.4 02/11/2016 0744   PROT 7.9 01/27/2016 1739   ALBUMIN 4.5 01/27/2016 1739   AST 17 01/27/2016 1739   ALT 15 01/27/2016 1739   ALKPHOS 60 01/27/2016 1739   BILITOT 0.6 01/27/2016 1739   GFRNONAA >60 02/11/2016 0744   GFRAA >60 02/11/2016 0744      No results found for: TSH    RADIOGRAPHY: Ct Soft Tissue Neck W Contrast  Result Date: 01/30/2016 CLINICAL DATA:  41 y/o F; left tonsil squamous cell carcinoma for staging. EXAM: CT NECK WITH CONTRAST TECHNIQUE: Multidetector CT imaging of the neck was performed using the standard protocol following the bolus administration of intravenous contrast. CONTRAST:  149m ISOVUE-300 IOPAMIDOL (ISOVUE-300) INJECTION 61% COMPARISON:  None. FINDINGS: Pharynx and larynx: There is asymmetric enlargement of the left palatini tonsil with soft tissue thickening extending superiorly to the uvula and the left aspect of the soft palate up to the margin of the soft palate and hard palate (series 9, image 59 and series 10, image 42). Inferiorly soft tissue thickening occupies the left lateral, anterior, and posterior aspects of the pharyngeal and hypopharyngeal wall. The inferior most extent of soft tissue thickening appears to reach the margin of the supraglottis without invasion of the supraglottic or glottic structures (series 10, image 56 and series 7, image 39). Anteriorly there is effacement of the left glossopharyngeal sulcus but no appreciable invasion of the base of tongue. There is no effacement of parapharyngeal or prevertebral fat. Salivary glands: No inflammation, mass, or stone. Thyroid: Normal. Lymph nodes: Lymph nodes in the left-sided 2 and 3 cervical lymph node levels are mildly asymmetrically enlarged in comparison with the contralateral neck (coronal image 61 and 65). The largest lymph node is the jugular digastric node the which measures 14 x 12 x 26 mm (AP x ML x CC) series 7, image 36 and series 10, image 61. The lymph nodes  are well circumscribed without evidence for necrosis or extranodal extension. Vascular: Negative. Limited intracranial: Negative. Visualized orbits: Negative. Mastoids and visualized paranasal sinuses: Clear. Skeleton: No acute or aggressive process. Upper chest: Negative. Other: None. IMPRESSION: Motion artifact at the level of oropharynx and base of tongue. Fine soft tissue details lost at these levels. Pharyngeal mass centered in the left palatini tonsil with surround mucosal thickening as described below probably representing neoplasm. Mucosal thickening of the left aspect of hypopharynx and pharynx with inferior most extension to the margin of the supraglottic airway without appreciable invasion into epiglottis, aryepiglottic folds, vocal cords, or paraglottic fat. Superior most extension of mucosal thickening to the left aspect of the uvula and along the left aspect of the soft palate to the hard/soft palate junction, this region is obscured by motion artifact. Anteriorly there is effacement of left glossopharyngeal sulcus without appreciable tongue base invasion, this region is obscured  by motion artifact. Asymmetric enlargement of left-sided level 2 and 3 cervical lymph nodes possibly metastatic. No evidence for lymph node necrosis or extra nodal extension. Electronically Signed   By: Kristine Garbe M.D.   On: 01/30/2016 00:40   Ct Chest W Contrast  Result Date: 01/30/2016 CLINICAL DATA:  Left tonsillar cancer, for staging EXAM: CT CHEST WITH CONTRAST TECHNIQUE: Multidetector CT imaging of the chest was performed during intravenous contrast administration. CONTRAST:  116m ISOVUE-300 IOPAMIDOL (ISOVUE-300) INJECTION 61% COMPARISON:  None. FINDINGS: Cardiovascular: Heart is normal in size.  No pericardial effusion. No evidence of thoracic aortic aneurysm. Mediastinum/Nodes: No suspicious mediastinal lymphadenopathy. Triangular soft tissue in the anterior mediastinum (series 2/image 30) likely  reflects residual thymus. Visualized thyroid is unremarkable. Lungs/Pleura: No suspicious pulmonary nodules. Calcified granuloma in the superior segment right lower lobe (series 3/ image 50), benign. No focal consolidation. No pleural effusion or pneumothorax. Upper Abdomen: Visualized upper abdomen is notable for a 7 mm cyst in the inferior right hepatic lobe (series 2/ image 137). Musculoskeletal: Visualized osseous structures are within normal limits. IMPRESSION: No evidence of metastatic disease in the chest. Electronically Signed   By: SJulian HyM.D.   On: 01/30/2016 07:47   Ir Gastrostomy Tube Mod Sed  Result Date: 02/11/2016 INDICATION: History of the vessel carcinoma of the tonsil. Please perform percutaneous gastrostomy tube placement prior to the initiation of chemoradiation. EXAM: PULL TROUGH GASTROSTOMY TUBE PLACEMENT COMPARISON:  PET-CT - 02/06/2016 MEDICATIONS: Ancef 2 gm IV; Antibiotics were administered within 1 hour of the procedure. Glucagon 1 mg IV CONTRAST:  30 mL of Omnipaque 300 administered into the gastric lumen. ANESTHESIA/SEDATION: Moderate (conscious) sedation was employed during this procedure. A total of Versed 3 mg and Fentanyl 50 mcg was administered intravenously. Moderate Sedation Time: 10 minutes. The patient's level of consciousness and vital signs were monitored continuously by radiology nursing throughout the procedure under my direct supervision. FLUOROSCOPY TIME:  1 minute 24 seconds (16 mGy) COMPLICATIONS: None immediate. PROCEDURE: Informed written consent was obtained from the patient (via the use of a medical translator) following explanation of the procedure, risks, benefits and alternatives. A time out was performed prior to the initiation of the procedure. Ultrasound scanning was performed to demarcate the edge of the left lobe of the liver. Maximal barrier sterile technique utilized including caps, mask, sterile gowns, sterile gloves, large sterile drape,  hand hygiene and Betadine prep. The left upper quadrant was sterilely prepped and draped. An oral gastric catheter was inserted into the stomach under fluoroscopy. The existing nasogastric feeding tube was removed. The left costal margin and barium / air opacified transverse colon were identified and avoided. Air was injected into the stomach for insufflation and visualization under fluoroscopy. Under sterile conditions a 17 gauge trocar needle was utilized to access the stomach percutaneously beneath the left subcostal margin after the overlying soft tissues were anesthetized with 1% Lidocaine with epinephrine. Needle position was confirmed within the stomach with aspiration of air and injection of small amount of contrast. A single T tack was deployed for gastropexy. Over an Amplatz guide wire, a 9-French sheath was inserted into the stomach. A snare device was utilized to capture the oral gastric catheter. The snare device was pulled retrograde from the stomach up the esophagus and out the oropharynx. The 20-French pull-through gastrostomy was connected to the snare device and pulled antegrade through the oropharynx down the esophagus into the stomach and then through the percutaneous tract external to the patient. The gastrostomy  was assembled externally. Contrast injection confirms position in the stomach. Several spot radiographic images were obtained in various obliquities for documentation. The patient tolerated procedure well without immediate post procedural complication. FINDINGS: After successful fluoroscopic guided placement, the gastrostomy tube is appropriately positioned with internal disc against the ventral aspect of the gastric lumen. IMPRESSION: Successful fluoroscopic insertion of a 20-French pull-through gastrostomy tube. The gastrostomy may be used immediately for medication administration and in 24 hrs for the initiation of feeds. Electronically Signed   By: Sandi Mariscal M.D.   On: 02/11/2016  13:59   Nm Pet Image Initial (pi) Skull Base To Thigh  Result Date: 02/06/2016 CLINICAL DATA:  Initial treatment strategy for left tonsillar squamous cell carcinoma. EXAM: NUCLEAR MEDICINE PET SKULL BASE TO THIGH TECHNIQUE: 11.9 mCi F-18 FDG was injected intravenously. Full-ring PET imaging was performed from the skull base to thigh after the radiotracer. CT data was obtained and used for attenuation correction and anatomic localization. FASTING BLOOD GLUCOSE:  Value: 93 mg/dl COMPARISON:  CT neck from 01/29/2016 FINDINGS: NECK The left palatine tonsillar mass shown on recent CT has a maximum SUV of 38.5. A left station IIa lymph node measures 1.1 cm in short axis on image 38/3 but maximum SUV is only 3.5. Contralateral structures in this vicinity have a maximum SUV of 3.0 and accordingly this node is only very minimally above the contralateral activity. CHEST No hypermetabolic mediastinal or hilar nodes. No suspicious pulmonary nodules on the CT scan. There is a calcified granuloma in the superior segment right lower lobe without associated metabolic activity. Mild cardiomegaly. ABDOMEN/PELVIS No abnormal hypermetabolic activity within the liver, pancreas, adrenal glands, or spleen. No hypermetabolic lymph nodes in the abdomen or pelvis. Incidental note is made of an IUD satisfactorily positioned in the endometrial canal. SKELETON No focal hypermetabolic activity to suggest skeletal metastasis. IMPRESSION: 1. Highly hypermetabolic left palatine tonsillar mass, maximum SUV 38.5. 2. A left station IIa lymph node measure 1.1 cm in short axis, mildly enlarged on image 38/3, but has only a borderline elevated SUV at 3.5. 3. Other imaging findings of potential clinical significance: Mild cardiomegaly. Calcified granuloma in the superior segment right lower lobe (benign). IUD satisfactorily positioned in the uterus. Electronically Signed   By: Van Clines M.D.   On: 02/06/2016 12:17   Ir US Guide Vasc Access  Right  Result Date: 02/11/2016 INDICATION: History of squamous cell carcinoma of the left tonsil. In need of durable intravenous access for chemotherapy administration. EXAM: IMPLANTED PORT A CATH PLACEMENT WITH ULTRASOUND AND FLUOROSCOPIC GUIDANCE COMPARISON:  PET-CT - 02/06/2016 MEDICATIONS: Ancef 2 gm IV; The antibiotic was administered within an appropriate time interval prior to skin puncture. ANESTHESIA/SEDATION: Moderate (conscious) sedation was employed during this procedure. A total of Versed 3 mg and Fentanyl 50 mcg was administered intravenously. Moderate Sedation Time: 26 minutes. The patient's level of consciousness and vital signs were monitored continuously by radiology nursing throughout the procedure under my direct supervision. CONTRAST:  None FLUOROSCOPY TIME:  12 seconds (4 mGy) COMPLICATIONS: None immediate. PROCEDURE: The procedure, risks, benefits, and alternatives were explained to the patient. Questions regarding the procedure were encouraged and answered. The patient understands and consents to the procedure. The right neck and chest were prepped with chlorhexidine in a sterile fashion, and a sterile drape was applied covering the operative field. Maximum barrier sterile technique with sterile gowns and gloves were used for the procedure. A timeout was performed prior to the initiation of the procedure. Local anesthesia  was provided with 1% lidocaine with epinephrine. After creating a small venotomy incision, a micropuncture kit was utilized to access the internal jugular vein under direct, real-time ultrasound guidance. Ultrasound image documentation was performed. The microwire was kinked to measure appropriate catheter length. A subcutaneous port pocket was then created along the upper chest wall utilizing a combination of sharp and blunt dissection. The pocket was irrigated with sterile saline. A single lumen ISP power injectable port was chosen for placement. The 8 Fr catheter was  tunneled from the port pocket site to the venotomy incision. The port was placed in the pocket. The external catheter was trimmed to appropriate length. At the venotomy, an 8 Fr peel-away sheath was placed over a guidewire under fluoroscopic guidance. The catheter was then placed through the sheath and the sheath was removed. Final catheter positioning was confirmed and documented with a fluoroscopic spot radiograph. The port was accessed with a Huber needle, aspirated and flushed with heparinized saline. The venotomy site was closed with an interrupted 4-0 Vicryl suture. The port pocket incision was closed with interrupted 2-0 Vicryl suture and the skin was opposed with a running subcuticular 4-0 Vicryl suture. Dermabond and Steri-strips were applied to both incisions. Dressings were placed. The patient tolerated the procedure well without immediate post procedural complication. FINDINGS: After catheter placement, the tip lies within the superior cavoatrial junction. The catheter aspirates and flushes normally and is ready for immediate use. IMPRESSION: Successful placement of a right internal jugular approach power injectable Port-A-Cath. The catheter is ready for immediate use. Electronically Signed   By: Sandi Mariscal M.D.   On: 02/11/2016 12:27   Ir Fluoro Guide Port Insertion Right  Result Date: 02/11/2016 INDICATION: History of squamous cell carcinoma of the left tonsil. In need of durable intravenous access for chemotherapy administration. EXAM: IMPLANTED PORT A CATH PLACEMENT WITH ULTRASOUND AND FLUOROSCOPIC GUIDANCE COMPARISON:  PET-CT - 02/06/2016 MEDICATIONS: Ancef 2 gm IV; The antibiotic was administered within an appropriate time interval prior to skin puncture. ANESTHESIA/SEDATION: Moderate (conscious) sedation was employed during this procedure. A total of Versed 3 mg and Fentanyl 50 mcg was administered intravenously. Moderate Sedation Time: 26 minutes. The patient's level of consciousness and  vital signs were monitored continuously by radiology nursing throughout the procedure under my direct supervision. CONTRAST:  None FLUOROSCOPY TIME:  12 seconds (4 mGy) COMPLICATIONS: None immediate. PROCEDURE: The procedure, risks, benefits, and alternatives were explained to the patient. Questions regarding the procedure were encouraged and answered. The patient understands and consents to the procedure. The right neck and chest were prepped with chlorhexidine in a sterile fashion, and a sterile drape was applied covering the operative field. Maximum barrier sterile technique with sterile gowns and gloves were used for the procedure. A timeout was performed prior to the initiation of the procedure. Local anesthesia was provided with 1% lidocaine with epinephrine. After creating a small venotomy incision, a micropuncture kit was utilized to access the internal jugular vein under direct, real-time ultrasound guidance. Ultrasound image documentation was performed. The microwire was kinked to measure appropriate catheter length. A subcutaneous port pocket was then created along the upper chest wall utilizing a combination of sharp and blunt dissection. The pocket was irrigated with sterile saline. A single lumen ISP power injectable port was chosen for placement. The 8 Fr catheter was tunneled from the port pocket site to the venotomy incision. The port was placed in the pocket. The external catheter was trimmed to appropriate length. At the venotomy, an  8 Fr peel-away sheath was placed over a guidewire under fluoroscopic guidance. The catheter was then placed through the sheath and the sheath was removed. Final catheter positioning was confirmed and documented with a fluoroscopic spot radiograph. The port was accessed with a Huber needle, aspirated and flushed with heparinized saline. The venotomy site was closed with an interrupted 4-0 Vicryl suture. The port pocket incision was closed with interrupted 2-0 Vicryl  suture and the skin was opposed with a running subcuticular 4-0 Vicryl suture. Dermabond and Steri-strips were applied to both incisions. Dressings were placed. The patient tolerated the procedure well without immediate post procedural complication. FINDINGS: After catheter placement, the tip lies within the superior cavoatrial junction. The catheter aspirates and flushes normally and is ready for immediate use. IMPRESSION: Successful placement of a right internal jugular approach power injectable Port-A-Cath. The catheter is ready for immediate use. Electronically Signed   By: Sandi Mariscal M.D.   On: 02/11/2016 12:27      IMPRESSION/PLAN: This is a delightful patient with tonsillar head and neck cancer. I would recommend 7 weeks of curative radiotherapy for this patient.  We discussed the potential risks, benefits, and side effects of radiotherapy. We talked in detail about acute and late effects. We discussed that some of the most bothersome acute effects may be mucositis, dysgeusia, salivary changes, skin irritation, hair loss, dehydration, weight loss and fatigue. We talked about late effects which include but are not necessarily limited to dysphagia, hypothyroidism, nerve injury, spinal cord injury, xerostomia, trismus, and neck edema. No guarantees of treatment were given. A consent form was signed and placed in the patient's medical record. The patient is enthusiastic about proceeding with treatment. I look forward to participating in the patient's care.    Patient's daughter in law appears very supportive today. She provided translation; patient prefers this, and has declined other forms of translation in the health system in the past.  The patient will be referred back to [place] for CT Simulation (treatment planning) following consultation with dentistry.   We also discussed that the treatment of head and neck cancer is a multidisciplinary process to maximize treatment outcomes and quality of life.  For this reason the following referrals have been or will be made:  1) Nutritionist for nutrition support during and after treatment at [facility] - Dr Whitney Muse expressed intent to refer.. We discussed drinking nutrition shakes such as Boost or Ensure, and encouraged the patient to drink or instill at least 2 cans a day to maintain her weight.  2) Physical therapy due to risk of lymphedema in neck and deconditioning. Dr Whitney Muse expressed intent to refer at [facility].  3) Baseline labs including TSH. We also discussed the need for yearly thyroid function monitoring.  The patient is scheduled to see a dentist today to discuss any necessary extractions prior to radiotherapy. We discussed that following with a dentist will also provide advice on reducing risk of cavities, osteoradionecrosis, or other oral issues. Additionally, we discussed that the patient should perform jaw exercises as prescribed by the dentist.  The patient is already following with Swallowing Therapy at [facility], and I strongly encouraged her to continue with this in order to prevent future swallowing issues from arising.  Of note, she is not pregnant, per test last week at [facility], and uses an IUD.  ADDENDUM 02-18-16: patient discussed at tumor board.  Lesion is felt to be on the upper edge of T2 rather than T3.  This is a more favorable  prognosis and warrants considering definitive RT rather than ChRT.  I discussed this with Kirby Crigler PA who will discuss further with the MD at [facility] when she returns to clinic next week. __________________________________________   Eppie Gibson, MD  This document serves as a record of services personally performed by Eppie Gibson, MD. It was created on her behalf by Maryla Morrow, a trained medical scribe. The creation of this record is based on the scribe's personal observations and the provider's statements to them. This document has been checked and approved by the attending provider.

## 2016-02-13 NOTE — Patient Instructions (Signed)

## 2016-02-13 NOTE — Progress Notes (Signed)
DENTAL CONSULTATION  Date of Consultation:  02/13/2016 Patient Name:   Heather Strickland Date of Birth:   14-Sep-1975 Medical Record Number: [id number]  VITALS: BP 106/67 (BP Location: Left Arm)   Pulse 61   Temp 97.8 F (36.6 C) (Oral)   CHIEF COMPLAINT: Patient referred by Dr. Whitney Muse and Dr. Isidore Moos for a dental consultation.  HPI: Heather Strickland is a 41 year old Hispanic female with recent diagnosed with squamous cell carcinoma of the left tonsil. Patient with anticipated chemoradiation therapy. Patient now seen as part of a medically necessary pre-chemoradiation therapy dental protocol examination to rule out dental infection that may affect the patient's systemic health and to prevent future complications such as osteoradionecrosis.  The patient currently denies acute toothaches, swellings, or abscesses. Patient was last seen in April of 2017 for a "cleaning". This was with [facility]. Patient has not had any other dental treatment with that office. Patient indicates that she does not have a primary dentist. Patient has some dental phobia.  PROBLEM LIST: Patient Active Problem List   Diagnosis Date Noted  . Squamous cell carcinoma of left tonsil (HCC) 01/27/2016    Priority: High  . GERD (gastroesophageal reflux disease) 12/22/2015  . H. pylori infection 07/21/2015  . Esophageal reflux 06/19/2015  . Prediabetes 06/19/2015  . Obesity, unspecified 06/19/2015  . Absolute anemia 06/19/2015    PMH: Past Medical History:  Diagnosis Date  . Anemia   . GERD (gastroesophageal reflux disease)   . Squamous cell carcinoma of left tonsil ([place]) 01/27/2016    PSH: Past Surgical History:  Procedure Laterality Date  . CESAREAN SECTION  [id number]  . ESOPHAGOGASTRODUODENOSCOPY N/A 11/07/2015   Dr. Gala Romney: normal esophagus, chronic gastritis   . IR GENERIC HISTORICAL  02/11/2016   IR FLUORO GUIDE PORT INSERTION RIGHT 02/11/2016 Sandi Mariscal, MD [facility]  .  IR GENERIC HISTORICAL  02/11/2016   IR US GUIDE VASC ACCESS RIGHT 02/11/2016 Sandi Mariscal, MD [facility]  . IR GENERIC HISTORICAL  02/11/2016   IR GASTROSTOMY TUBE MOD SED 02/11/2016 Sandi Mariscal, MD [facility]    ALLERGIES: No Known Allergies  MEDICATIONS: Current Outpatient Prescriptions  Medication Sig Dispense Refill  . ferrous sulfate 325 (65 FE) MG tablet Take 1 tablet (325 mg total) by mouth daily. 30 tablet 0  . levonorgestrel (MIRENA) 20 MCG/24HR IUD 1 each by Intrauterine route once.    . Multiple Vitamins-Minerals (MULTIVITAMIN WITH MINERALS) tablet Take 1 tablet by mouth daily.    Marland Kitchen oxyCODONE-acetaminophen (PERCOCET/ROXICET) 5-325 MG tablet Take 1 tablet by mouth every 4 (four) hours as needed for severe pain.    . pantoprazole (PROTONIX) 40 MG tablet Take 1 tablet (40 mg total) by mouth 2 (two) times daily before a meal. Tome una tableta por boca diaria 60 tablet 3   No current facility-administered medications for this visit.     LABS: Lab Results  Component Value Date   WBC 6.1 02/11/2016   HGB 12.5 02/11/2016   HCT 36.6 02/11/2016   MCV 89.7 02/11/2016   PLT 240 02/11/2016      Component Value Date/Time   NA 138 02/11/2016 0744   K 3.8 02/11/2016 0744   CL 104 02/11/2016 0744   CO2 25 02/11/2016 0744   GLUCOSE 98 02/11/2016 0744   BUN 12 02/11/2016 0744   CREATININE 0.58 02/11/2016 0744   CALCIUM 9.4 02/11/2016 0744   GFRNONAA >60 02/11/2016 0744   GFRAA >60 02/11/2016 0744   Lab Results  Component Value Date  INR 0.96 02/11/2016   No results found for: PTT  SOCIAL HISTORY: Social History   Social History  . Marital status: Married    Spouse name: N/A  . Number of children: 4  . Years of education: N/A   Occupational History  . Not on file.   Social History Main Topics  . Smoking status: Never Smoker  . Smokeless tobacco: Never Used  . Alcohol use No  . Drug use: No  . Sexual activity: Not Currently    Birth control/ protection: None,  IUD   Other Topics Concern  . Not on file   Social History Narrative  . No narrative on file      FAMILY HISTORY: Family History  Problem Relation Age of Onset  . Diabetes Mother   . Hypertension Mother   . Prostatitis Father   . ADD / ADHD Daughter   . Cerebral palsy Son     REVIEW OF SYSTEMS: Reviewed with patient and her daughter in law as per history of present illness.  Psych: Patient has some dental phobia.  DENTAL HISTORY: CHIEF COMPLAINT: Patient referred by Dr. Whitney Muse and Dr. Isidore Moos for a dental consultation.  HPI: Heather Strickland is a 41 year old Hispanic female with recent diagnosed with squamous cell carcinoma of the left tonsil. Patient with anticipated chemoradiation therapy. Patient now seen as part of a medically necessary pre-chemoradiation therapy dental protocol examination to rule out dental infection that may affect the patient's systemic health and to prevent future complications such as osteoradionecrosis.  The patient currently denies acute toothaches, swellings, or abscesses. Patient was last seen in April of 2017 for a "cleaning". This was with [facility]. Patient has not had any other dental treatment with that office. Patient indicates that she does not have a primary dentist. Patient has some dental phobia.  DENTAL EXAMINATION: GENERAL: The patient is a well-developed, well-nourished female in no acute distress. HEAD AND NECK: The patient has left neck lymphadenopathy. Patient denies acute TMJ symptoms. INTRAORAL EXAM: Patient has normal saliva. There is no evidence of oral abscess formation. Left tonsil is consistent with cancer diagnosis. DENTITION: Patient is missing tooth numbers 1 and 16. Multiple rotated teeth are noted. PERIODONTAL: Patient has chronic periodontitis with plaque and calculus accumulations, selective areas of gingival recession and mandibular anterior incipient tooth mobility. DENTAL CARIES/SUBOPTIMAL  RESTORATIONS: Dental caries are noted as per dental charting form. ENDODONTIC: The patient denies acute pulpitis symptoms. I do not see any evidence of periapical pathology. CROWN AND BRIDGE: There are no crown or bridge restorations. PROSTHODONTIC: There are no partial dentures. OCCLUSION: Patient has a poor occlusal scheme and malocclusion with an anterior crossbite. The occlusion is stable, however.  RADIOGRAPHIC INTERPRETATION: An orthopantogram was taken and supplemented with a full series of dental radiographs. Missing tooth numbers 1 and 16. Multiple rotated teeth are noted. There is incipient to moderate bone loss noted. Dental caries are noted. There is no evidence of periapical pathology or radiolucency.  ASSESSMENTS: 1. Squamous cell carcinoma of the left tonsil 2. Pre-chemoradiation therapy dental protocol 3. Chronic periodontitis with bone loss 4. Gingival recession 5. Accretions 6. Incipient mandibular anterior tooth mobility 7. Dental caries 8. Missing tooth numbers 1 and 16. 9. Multiple rotated teeth 10. Anterior crossbite 11. Poor occlusal scheme and malocclusion   PLAN/RECOMMENDATIONS: 1. I discussed the risks, benefits, and complications of various treatment options with the patient and her daughter in law in relationship to her medical and dental conditions, anticipated chemoradiation therapy, chemoradiation  therapy side effects to include xerostomia, radiation caries, trismus, mucositis, taste changes, gum and jawbone changes, and risk for infection and osteoradionecrosis. We discussed various treatment options to include no treatment, multiple extractions with alveoloplasty, pre-prosthetic surgery as indicated, periodontal therapy, dental restorations, root canal therapy, crown and bridge therapy, implant therapy, and replacement of missing teeth as indicated. We also discussed fabrication of fluoride trays. We also discussed referral to an oral surgeon. The patient  refuses referral to an oral surgeon. The patient currently wishes to proceed with extraction of tooth numbers 15, 17, 18, and 32 with alveoloplasty and gross debridement of remaining dentition in the operating room with general anesthesia.  This is been scheduled for Friday, 02/20/2016 at 11:30 AM at [facility].  The surgery time may be moved up as time and space permits in the OR schedule. The patient will then proceed with chemoradiation therapy approximately 2 weeks from that date with anticipated start date of 03/08/2016. Impressions were made today for the fabrication of fluoride trays. Scatter protection devices do not appear to be needed at this time. Fluoride trays will be inserted on 02/27/2016 possible dental restorations as time and space and patient condition permits.   2. Discussion of findings with medical team and coordination of future medical and dental care as needed.  I spent in excess of  120 minutes during the conduct of this consultation and >50% of this time involved direct face-to-face encounter for counseling and/or coordination of the patient's care.    Lenn Cal, DDS

## 2016-02-13 NOTE — Progress Notes (Signed)
Oncology Nurse Navigator Documentation  Visited patient [id number] to provide PEG education.  Her husband and interpreter Marlene [place] were present. Using  PEG teaching device   and Teach Back, provided education for PEG use and care, including: hand hygiene, gravity bolus administration of daily water flushes, nutritional supplement, fluids and medications; care of tube insertion site including daily dressing change and cleaning; S&S of infection.  Patient and husband correctly verbalized dressing change and cleaning procedures, provided correct return demonstration of dressing change and gravity administration of water.  I provided written instructions for PEG flushing/dressing change in support of verbal instruction, reviewed contents of PEG supply kit delivered by [facility].  They understand I will be available for ongoing PEG educational support.  Patient's dtr-in-law Joellen Jersey who is American and speaks fluent Spanish will be providing support and care.  Gayleen Orem, RN, BSN, [place] Neck Oncology Nurse [place] at [place] [phone]

## 2016-02-13 NOTE — Progress Notes (Signed)
Oncology Nurse Navigator Documentation  Met with patient during initial consult with Dr. Isidore Moos.  She was accompanied by her dtr-in-law Joellen [place] who provided interpretation.  Joellen Jersey is currently in nursing school at [employer]. 1. Further introduced myself as her Navigator, explained my role as a member of the Care Team.   2. Provided New Patient Information packet, discussed contents:  Contact information for physician(s), myself, other members of the Care Team.  Advance Directive information ([place] blue pamphlet with LCSW contact info)  Fall Prevention Patient Safety Plan  Appointment [place] sheet  [place] [facility] map with highlight of [place] 3. Provided introductory explanation of radiation treatment including SIM planning and purpose of Aquaplast head and shoulder mask, showed them example.   4.  Using PEG teaching device Using  PEG teaching device   and Teach Back, provided Katie education for PEG use and care, including: hand hygiene, gravity bolus administration of daily water flushes, nutritional supplement, fluids and medications; care of tube insertion site including daily dressing change and cleaning; S&S of infection.  Ms. Donnie Aho correctly verbalized dressing change and cleaning procedures as discussed earlier this week. Katie indicated familiarity with PEG use/care as her dtr had undergone chemotherapy of leukemia.  5.  Provided tour of SIM and treatment areas, explained treatment and arrival procedures. 6.  I escorted them to Dental Medicine for appt.  They understand I can be contacted with needs/concerns.  Gayleen Orem, RN, BSN, [place] Neck Oncology Nurse [place] at [place] [phone]

## 2016-02-16 ENCOUNTER — Telehealth: Payer: Self-pay | Admitting: *Deleted

## 2016-02-16 ENCOUNTER — Other Ambulatory Visit (HOSPITAL_COMMUNITY): Payer: Self-pay | Admitting: Oncology

## 2016-02-16 NOTE — Telephone Encounter (Signed)
CALLED PATIENT TO ASK ABOUT COMING FOR LABS, PATIENT AGREED TO COME IN EARLY ON 02-23-16

## 2016-02-18 ENCOUNTER — Telehealth: Payer: Self-pay | Admitting: *Deleted

## 2016-02-18 NOTE — Telephone Encounter (Signed)
Oncology Nurse Navigator Documentation  Spoke with patient's dtr-in-law, Joellen Jersey, confirmed her understanding of 2/12 appts starting at 1:45 with lab followed by 2:15 IV Start and 3:00 CT SIM.  She voiced understanding.  Gayleen Orem, RN, BSN, [place] Neck Oncology Nurse [place] at [place] [phone]

## 2016-02-18 NOTE — Addendum Note (Signed)
Encounter addended by: Eppie Gibson, MD on: 02/18/2016  9:09 AM<BR>    Actions taken: Sign clinical note

## 2016-02-18 NOTE — Progress Notes (Signed)
Error tes  

## 2016-02-19 ENCOUNTER — Encounter (HOSPITAL_COMMUNITY): Payer: Self-pay | Admitting: *Deleted

## 2016-02-19 NOTE — Progress Notes (Signed)
  Pt SDW-Pre-op call completed by both daughter in-law Joellen Jersey and pt ( per pt request). Pt denies SOB, chest pain, and being under the care of a cardiologist. Pt denies having a stress test , echo and cardiac cath. Pt denies having an EKG and chest x ray within the last year. Katie made aware to have  stop taking Aspirin,vitamins, fish oil and herbal medications. Do not take any NSAIDs ie: Ibuprofen, Advil, Naproxen, BC and Goody Powder or any medication containing Aspirin. Katie verbalized understanding of all pre-op instructions.

## 2016-02-19 NOTE — Anesthesia Preprocedure Evaluation (Addendum)
Anesthesia Evaluation  Patient identified by MRN, date of birth, ID band Patient awake    Reviewed: Allergy & Precautions, NPO status , Patient's Chart, lab work & pertinent test results  History of Anesthesia Complications Negative for: history of anesthetic complications  Airway Mallampati: II  TM Distance: >3 FB Neck ROM: Full    Dental no notable dental hx. (+) Dental Advisory Given   Pulmonary neg pulmonary ROS,    Pulmonary exam normal        Cardiovascular negative cardio ROS Normal cardiovascular exam     Neuro/Psych negative neurological ROS  negative psych ROS   GI/Hepatic Neg liver ROS, GERD  Medicated,  Endo/Other  negative endocrine ROS  Renal/GU negative Renal ROS     Musculoskeletal negative musculoskeletal ROS (+)   Abdominal   Peds  Hematology negative hematology ROS (+)   Anesthesia Other Findings Day of surgery medications reviewed with the patient.  Reproductive/Obstetrics                            Anesthesia Physical Anesthesia Plan  ASA: III  Anesthesia Plan: General   Post-op Pain Management:    Induction: Intravenous  Airway Management Planned: Nasal ETT  Additional Equipment:   Intra-op Plan:   Post-operative Plan: Extubation in OR  Informed Consent: I have reviewed the patients History and Physical, chart, labs and discussed the procedure including the risks, benefits and alternatives for the proposed anesthesia with the patient or authorized representative who has indicated his/her understanding and acceptance.   Dental advisory given  Plan Discussed with: CRNA and Anesthesiologist  Anesthesia Plan Comments:        Anesthesia Quick Evaluation

## 2016-02-20 ENCOUNTER — Encounter (HOSPITAL_COMMUNITY): Payer: Self-pay | Admitting: *Deleted

## 2016-02-20 ENCOUNTER — Ambulatory Visit (HOSPITAL_COMMUNITY)
Admission: RE | Admit: 2016-02-20 | Discharge: 2016-02-20 | Disposition: A | Discharge status: Home / Self Care | Payer: Self-pay | Source: Ambulatory Visit | Attending: Dentistry | Admitting: Dentistry

## 2016-02-20 ENCOUNTER — Ambulatory Visit (HOSPITAL_COMMUNITY): Payer: Self-pay | Admitting: Anesthesiology

## 2016-02-20 ENCOUNTER — Encounter (HOSPITAL_COMMUNITY)
Admission: RE | Disposition: A | Discharge status: Home / Self Care | Payer: Self-pay | Source: Ambulatory Visit | Attending: Dentistry

## 2016-02-20 DIAGNOSIS — D649 Anemia, unspecified: Secondary | ICD-10-CM | POA: Insufficient documentation

## 2016-02-20 DIAGNOSIS — K0530 Chronic periodontitis, unspecified: Secondary | ICD-10-CM

## 2016-02-20 DIAGNOSIS — K036 Deposits [accretions] on teeth: Secondary | ICD-10-CM

## 2016-02-20 DIAGNOSIS — K029 Dental caries, unspecified: Secondary | ICD-10-CM

## 2016-02-20 DIAGNOSIS — Z793 Long term (current) use of hormonal contraceptives: Secondary | ICD-10-CM | POA: Insufficient documentation

## 2016-02-20 DIAGNOSIS — Z79899 Other long term (current) drug therapy: Secondary | ICD-10-CM | POA: Insufficient documentation

## 2016-02-20 DIAGNOSIS — K219 Gastro-esophageal reflux disease without esophagitis: Secondary | ICD-10-CM | POA: Insufficient documentation

## 2016-02-20 DIAGNOSIS — C099 Malignant neoplasm of tonsil, unspecified: Secondary | ICD-10-CM

## 2016-02-20 DIAGNOSIS — Z931 Gastrostomy status: Secondary | ICD-10-CM | POA: Insufficient documentation

## 2016-02-20 HISTORY — PX: MULTIPLE EXTRACTIONS WITH ALVEOLOPLASTY: SHX5342

## 2016-02-20 SURGERY — MULTIPLE EXTRACTION WITH ALVEOLOPLASTY
Anesthesia: General | Site: Mouth

## 2016-02-20 MED ORDER — LACTATED RINGERS IV SOLN
INTRAVENOUS | Status: DC | PRN
  Administered 2016-02-20 (×2): via INTRAVENOUS

## 2016-02-20 MED ORDER — ONDANSETRON HCL 4 MG/2ML IJ SOLN
INTRAMUSCULAR | Status: DC | PRN
  Administered 2016-02-20: 4 mg via INTRAVENOUS

## 2016-02-20 MED ORDER — LACTATED RINGERS IV SOLN: INTRAVENOUS | Status: DC

## 2016-02-20 MED ORDER — OXYCODONE-ACETAMINOPHEN 5-325 MG PO TABS: 1.0000 | ORAL_TABLET | Freq: Four times a day (QID) | ORAL | Status: DC | PRN

## 2016-02-20 MED ORDER — BUPIVACAINE-EPINEPHRINE 0.5% -1:200000 IJ SOLN
INTRAMUSCULAR | Status: DC | PRN
  Administered 2016-02-20: 3.4 mL

## 2016-02-20 MED ORDER — OXYMETAZOLINE HCL 0.05 % NA SOLN
NASAL | Status: DC | PRN
  Administered 2016-02-20: 2 via NASAL

## 2016-02-20 MED ORDER — CEFAZOLIN SODIUM-DEXTROSE 2-4 GM/100ML-% IV SOLN
INTRAVENOUS | Status: AC
  Filled 2016-02-20: qty 100

## 2016-02-20 MED ORDER — PHENYLEPHRINE 40 MCG/ML (10ML) SYRINGE FOR IV PUSH (FOR BLOOD PRESSURE SUPPORT)
PREFILLED_SYRINGE | INTRAVENOUS | Status: AC
  Filled 2016-02-20: qty 10

## 2016-02-20 MED ORDER — PHENYLEPHRINE HCL 10 MG/ML IJ SOLN
INTRAVENOUS | Status: DC | PRN
  Administered 2016-02-20: 25 ug/min via INTRAVENOUS

## 2016-02-20 MED ORDER — PHENYLEPHRINE 40 MCG/ML (10ML) SYRINGE FOR IV PUSH (FOR BLOOD PRESSURE SUPPORT)
PREFILLED_SYRINGE | INTRAVENOUS | Status: DC | PRN
  Administered 2016-02-20 (×3): 80 ug via INTRAVENOUS

## 2016-02-20 MED ORDER — FENTANYL CITRATE (PF) 100 MCG/2ML IJ SOLN
INTRAMUSCULAR | Status: AC
  Filled 2016-02-20: qty 4

## 2016-02-20 MED ORDER — LIDOCAINE 2% (20 MG/ML) 5 ML SYRINGE
INTRAMUSCULAR | Status: DC | PRN
  Administered 2016-02-20: 100 mg via INTRAVENOUS

## 2016-02-20 MED ORDER — HYDROMORPHONE HCL 1 MG/ML IJ SOLN: 0.2500 mg | INTRAMUSCULAR | Status: DC | PRN

## 2016-02-20 MED ORDER — PROPOFOL 10 MG/ML IV BOLUS
INTRAVENOUS | Status: DC | PRN
  Administered 2016-02-20: 150 mg via INTRAVENOUS

## 2016-02-20 MED ORDER — 0.9 % SODIUM CHLORIDE (POUR BTL) OPTIME
TOPICAL | Status: DC | PRN
  Administered 2016-02-20: 1000 mL

## 2016-02-20 MED ORDER — EPHEDRINE 5 MG/ML INJ
INTRAVENOUS | Status: AC
  Filled 2016-02-20: qty 10

## 2016-02-20 MED ORDER — HEMOSTATIC AGENTS (NO CHARGE) OPTIME
TOPICAL | Status: DC | PRN
  Administered 2016-02-20: 1

## 2016-02-20 MED ORDER — SCOPOLAMINE 1 MG/3DAYS TD PT72
1.0000 | MEDICATED_PATCH | TRANSDERMAL | Status: DC
  Administered 2016-02-20: 1.5 mg via TRANSDERMAL
  Administered 2016-02-20: 1 via TRANSDERMAL

## 2016-02-20 MED ORDER — EPHEDRINE SULFATE-NACL 50-0.9 MG/10ML-% IV SOSY
PREFILLED_SYRINGE | INTRAVENOUS | Status: DC | PRN
  Administered 2016-02-20 (×2): 5 mg via INTRAVENOUS

## 2016-02-20 MED ORDER — FENTANYL CITRATE (PF) 100 MCG/2ML IJ SOLN
INTRAMUSCULAR | Status: DC | PRN
  Administered 2016-02-20 (×3): 50 ug via INTRAVENOUS

## 2016-02-20 MED ORDER — BUPIVACAINE-EPINEPHRINE (PF) 0.5% -1:200000 IJ SOLN
INTRAMUSCULAR | Status: AC
  Filled 2016-02-20: qty 1.8

## 2016-02-20 MED ORDER — PROMETHAZINE HCL 25 MG/ML IJ SOLN: 6.2500 mg | INTRAMUSCULAR | Status: DC | PRN

## 2016-02-20 MED ORDER — CEFAZOLIN SODIUM-DEXTROSE 2-4 GM/100ML-% IV SOLN
2.0000 g | Freq: Once | INTRAVENOUS | Status: AC
  Administered 2016-02-20: 2 g via INTRAVENOUS

## 2016-02-20 MED ORDER — SCOPOLAMINE 1 MG/3DAYS TD PT72
MEDICATED_PATCH | TRANSDERMAL | Status: AC
  Filled 2016-02-20: qty 1

## 2016-02-20 MED ORDER — SUGAMMADEX SODIUM 200 MG/2ML IV SOLN
INTRAVENOUS | Status: DC | PRN
  Administered 2016-02-20: 200 mg via INTRAVENOUS

## 2016-02-20 MED ORDER — ROCURONIUM BROMIDE 10 MG/ML (PF) SYRINGE
PREFILLED_SYRINGE | INTRAVENOUS | Status: DC | PRN
  Administered 2016-02-20: 50 mg via INTRAVENOUS

## 2016-02-20 MED ORDER — PROPOFOL 10 MG/ML IV BOLUS
INTRAVENOUS | Status: AC
  Filled 2016-02-20: qty 40

## 2016-02-20 MED ORDER — ONDANSETRON HCL 4 MG/2ML IJ SOLN
INTRAMUSCULAR | Status: AC
  Filled 2016-02-20: qty 2

## 2016-02-20 MED ORDER — LIDOCAINE-EPINEPHRINE 2 %-1:100000 IJ SOLN
INTRAMUSCULAR | Status: DC | PRN
  Administered 2016-02-20: 5.1 mL

## 2016-02-20 MED ORDER — LIDOCAINE-EPINEPHRINE 2 %-1:100000 IJ SOLN
INTRAMUSCULAR | Status: AC
  Filled 2016-02-20: qty 1.7

## 2016-02-20 MED ORDER — MIDAZOLAM HCL 5 MG/5ML IJ SOLN
INTRAMUSCULAR | Status: DC | PRN
  Administered 2016-02-20: 2 mg via INTRAVENOUS

## 2016-02-20 MED ORDER — OXYCODONE-ACETAMINOPHEN 5-325 MG PO TABS: ORAL_TABLET | ORAL | 0 refills | Status: DC

## 2016-02-20 MED ORDER — MIDAZOLAM HCL 2 MG/2ML IJ SOLN
INTRAMUSCULAR | Status: AC
  Filled 2016-02-20: qty 2

## 2016-02-20 SURGICAL SUPPLY — 34 items
ALCOHOL 70% 16 OZ (MISCELLANEOUS) ×3 IMPLANT
ATTRACTOMAT 16X20 MAGNETIC DRP (DRAPES) ×3 IMPLANT
BLADE SURG 15 STRL LF DISP TIS (BLADE) ×2 IMPLANT
BLADE SURG 15 STRL SS (BLADE) ×6
COVER SURGICAL LIGHT HANDLE (MISCELLANEOUS) ×3 IMPLANT
GAUZE PACKING FOLDED 2  STR (GAUZE/BANDAGES/DRESSINGS) ×2
GAUZE PACKING FOLDED 2 STR (GAUZE/BANDAGES/DRESSINGS) ×1 IMPLANT
GAUZE SPONGE 4X4 16PLY XRAY LF (GAUZE/BANDAGES/DRESSINGS) ×3 IMPLANT
GLOVE BIOGEL PI IND STRL 6 (GLOVE) ×1 IMPLANT
GLOVE BIOGEL PI INDICATOR 6 (GLOVE) ×2
GLOVE SURG ORTHO 8.0 STRL STRW (GLOVE) ×3 IMPLANT
GLOVE SURG SS PI 6.0 STRL IVOR (GLOVE) ×3 IMPLANT
GOWN STRL REUS W/ TWL LRG LVL3 (GOWN DISPOSABLE) ×1 IMPLANT
GOWN STRL REUS W/TWL 2XL LVL3 (GOWN DISPOSABLE) ×3 IMPLANT
GOWN STRL REUS W/TWL LRG LVL3 (GOWN DISPOSABLE) ×3
KIT BASIN OR (CUSTOM PROCEDURE TRAY) ×3 IMPLANT
KIT ROOM TURNOVER OR (KITS) ×3 IMPLANT
MANIFOLD NEPTUNE WASTE (CANNULA) ×3 IMPLANT
NDL BLUNT 16X1.5 OR ONLY (NEEDLE) ×1 IMPLANT
NEEDLE BLUNT 16X1.5 OR ONLY (NEEDLE) ×3 IMPLANT
NS IRRIG 1000ML POUR BTL (IV SOLUTION) ×3 IMPLANT
PACK EENT II TURBAN DRAPE (CUSTOM PROCEDURE TRAY) ×3 IMPLANT
PAD ARMBOARD 7.5X6 YLW CONV (MISCELLANEOUS) ×3 IMPLANT
SPONGE SURGIFOAM ABS GEL SZ50 (HEMOSTASIS) ×2 IMPLANT
SUCTION FRAZIER HANDLE 10FR (MISCELLANEOUS) ×2
SUCTION TUBE FRAZIER 10FR DISP (MISCELLANEOUS) ×1 IMPLANT
SUT CHROMIC 3 0 PS 2 (SUTURE) ×8 IMPLANT
SUT CHROMIC 4 0 P 3 18 (SUTURE) ×2 IMPLANT
SYR 50ML SLIP (SYRINGE) ×3 IMPLANT
TOWEL OR 17X26 10 PK STRL BLUE (TOWEL DISPOSABLE) ×3 IMPLANT
TUBE CONNECTING 12'X1/4 (SUCTIONS) ×1
TUBE CONNECTING 12X1/4 (SUCTIONS) ×2 IMPLANT
WATER TABLETS ICX (MISCELLANEOUS) ×3 IMPLANT
YANKAUER SUCT BULB TIP NO VENT (SUCTIONS) ×3 IMPLANT

## 2016-02-20 NOTE — Transfer of Care (Signed)
Immediate Anesthesia Transfer of Care Note  Patient: Heather Strickland  Procedure(s) Performed: Procedure(s): Extraction of tooth #'s 15,17,18 and 32 with alveoloplasty and dental cleaning of teeth (N/A)  Patient Location: PACU  Anesthesia Type:General  Level of Consciousness: awake, alert , oriented and patient cooperative  Airway & Oxygen Therapy: Patient Spontanous Breathing and Patient connected to face mask oxygen  Post-op Assessment: Report given to RN and Post -op Vital signs reviewed and stable  Post vital signs: Reviewed and stable  Last Vitals:  Vitals:   02/20/16 0612 02/20/16 0936  BP: 126/86   Pulse: 73   Resp: 20   Temp: 37.3 C (P) 36.8 C    Last Pain:  Vitals:   02/20/16 0612  TempSrc: Oral      Patients Stated Pain Goal: 3 ([id number])  Complications: No apparent anesthesia complications

## 2016-02-20 NOTE — Discharge Instructions (Signed)
Extraccin dental, cuidados posteriores (Dental Extraction, Care After)  Estas indicaciones le proporcionan informacin acerca de cmo deber cuidarse despus del procedimiento. El odontlogo tambin podr darle instrucciones especficas. Comunquese con el odontlogo si tiene algn problema o tiene preguntas despus del procedimiento. CUIDADOS EN EL HOGAR Estilo de vida   Proteja el lugar de Air traffic controller. Hgalo aunque no sienta ningn dolor.  No fume, no escupa y no beba lquidos con un sorbete hasta que el odontlogo lo autorice.  Coma alimentos blandos como se lo haya indicado el odontlogo. No consuma bebidas calientes ni alimentos picantes hasta que la enca haya cicatrizado. Cuidado del corte (incisin)   Siga las indicaciones de su odontlogo respecto de lo siguiente: ? Cmo cuidar el corte que se le hizo en la enca. ? Cundo y cmo Scientist, research (medical). ? Cundo y cmo sacarse la gasa de la boca. ? El retiro de los puntos (suturas).  No mastique sobre la gasa.  Si la [employer] sangra mucho, doble un trozo de gasa limpio, colquelo sobre la enca sangrante y [place] se lo haya indicado el odontlogo. Instrucciones generales   [employer] solamente como se lo haya indicado el odontlogo.  No se enjuague la boca hasta que el odontlogo lo autorice. Cuando se lo permitan, es importante que se enjuague muy suavemente. ? Puede enjuagarse la boca con agua tibia con sal una vez que el odontlogo lo autorice. Para preparar un enjuague con sal, mezcle una cucharadita de sal en dos tazas de agua tibia. Hgalo como se lo haya indicado Paediatric nurse.  No se cepille ni se pase hilo dental cerca del lugar de donde se extrajo la pieza dental hasta que el odontlogo lo autorice. Puede cepillarse el resto de los dientes.  Si se lo indican, aplquese hielo en la mejilla del lado de la boca de donde se extrajo la pieza dental: ? Ponga el hielo en una bolsa plstica. ? Coloque una  [employer] piel y la bolsa de hielo. ? Coloque el hielo durante 20 minutos, 2 a 3 veces por da.  Concurra a todas las visitas de control como se lo haya indicado Paediatric nurse. Esto es importante. SOLICITE AYUDA SI:  El medicamento no Production designer, theatre/television/film.  Tiene fiebre y tambin alguno de estos sntomas: ? Ganas de vomitar (nuseas). ? Vmitos. ? Escalofros.  Mucha tos.  Comienza a sentir falta de [place]. SOLICITE AYUDA DE INMEDIATO SI:  Sangra mucho y no puede Psychologist, educational.  Aumenta la inflamacin (hinchazn).  Siente Geophysical data processor.  Tiene secrecin de lquido, sangre o pus que emanan de la enca donde se extrajo la pieza dental.  Presenta dificultad para tragar.  No puede abrir Equities trader. Esta informacin no tiene Marine scientist el consejo del mdico. Asegrese de hacerle al mdico cualquier pregunta que tenga. Document Released: 12/15/2011 Document Revised: 05/14/2014 Document Reviewed: 12/24/2013 Elsevier Interactive Patient Education  2017 [place]  FACTS:  Ice used in ice bag helps keep the swelling down, and can help lessen the pain.  It is easier to treat pain BEFORE it happens.  Spitting disturbs the clot and may cause bleeding to start again, or to get worse.  Smoking delays healing and can cause complications.  Sharing prescriptions can be dangerous.  Do not take medications not recently prescribed for you.  Antibiotics may stop birth control pills from working.  Use other means of birth control while on  antibiotics.  Warm salt water rinses after the first 24 hours will help lessen the swelling:  Use 1/2 teaspoonful of table salt per oz.of water.  DO NOT:  Do not spit.  Do not drink through a straw.  Strongly advised not to smoke, dip snuff or chew tobacco at least for 3 days.  Do not eat sharp or crunchy foods.  Avoid the area of surgery when chewing.  Do not stop your antibiotics before your  instructions say to do so.  Do not eat hot foods until bleeding has stopped.  If you need to, let your food cool down to room temperature.  EXPECT:  Some swelling, especially first 2-3 days.  Soreness or discomfort in varying degrees.  Follow your dentist's instructions about how to handle pain before it starts.  Pinkish saliva or light blood in saliva, or on your pillow in the morning.  This can last around 24 hours.  Bruising inside or outside the mouth.  This may not show up until 2-3 days after surgery.  Don't worry, it will go away in time.  Pieces of "bone" may work themselves loose.  It's OK.  If they bother you, let us know.  WHAT TO DO IMMEDIATELY AFTER SURGERY:  Bite on the gauze with steady pressure for 1-2 hours.  Don't chew on the gauze.  Do not lie down flat.  Raise your head support especially for the first 24 hours.  Apply ice to your face on the side of the surgery.  You may apply it 20 minutes on and a few minutes off.  Ice for 8-12 hours.  You may use ice up to 24 hours.  Before the numbness wears off, take a pain pill as instructed.  Prescription pain medication is not always required.  SWELLING:  Expect swelling for the first couple of days.  It should get better after that.  If swelling increases 3 days or so after surgery; let us know as soon as possible.  FEVER:  Take Tylenol every 4 hours if needed to lower your temperature, especially if it is at 100F or higher.  Drink lots of fluids.  If the fever does not go away, let us know.  BREATHING TROUBLE:  Any unusual difficulty breathing means you have to have someone bring you to the emergency room ASAP  BLEEDING:  Light oozing is expected for 24 hours or so.  Prop head up with pillows  Avoid spitting  Do not confuse bright red fresh flowing blood with lots of saliva colored with a little bit of blood.  If you notice some bleeding, place gauze or a tea bag where it is bleeding and apply  CONSTANT pressure by biting down for 1 hour.  Avoid talking during this time.  Do not remove the gauze or tea bag during this hour to "check" the bleeding.  If you notice bright RED bleeding FLOWING out of particular area, and filling the floor of your mouth, put a wad of gauze on that area, bite down firmly and constantly.  Call us immediately.  If we're closed, have someone bring you to the emergency room.  ORAL HYGIENE:  Brush your teeth as usual after meals and before bedtime.  Use a soft toothbrush around the area of surgery.  DO NOT AVOID BRUSHING.  Otherwise bacteria(germs) will grow and may delay healing or encourage infection.  Since you cannot spit, just gently rinse and let the water flow out of your mouth.  DO NOT SWISH  HARD.  EATING:  Cool liquids are a good point to start.  Increase to soft foods as tolerated.  PRESCRIPTIONS:  Follow the directions for your prescriptions exactly as written.  If Dr. Enrique Sack gave you a narcotic pain medication, do not drive, operate machinery or drink alcohol when on that medication.  QUESTIONS:  Call our office during office hours [phone] or call the Emergency Room at [phone].

## 2016-02-20 NOTE — Anesthesia Postprocedure Evaluation (Addendum)
Anesthesia Post Note  Patient: Heather Strickland  Procedure(s) Performed: Procedure(s) (LRB): Extraction of tooth #'s 15,17,18 and 32 with alveoloplasty and dental cleaning of teeth (N/A)  Patient location during evaluation: PACU Anesthesia Type: General Level of consciousness: sedated Pain management: pain level controlled Vital Signs Assessment: post-procedure vital signs reviewed and stable Respiratory status: spontaneous breathing and respiratory function stable Cardiovascular status: stable Anesthetic complications: no       Last Vitals:  Vitals:   02/20/16 1000 02/20/16 1008  BP:  126/77  Pulse: 78 78  Resp: 12 19  Temp:      Last Pain:  Vitals:   02/20/16 0612  TempSrc: Oral                 [name] DANIEL

## 2016-02-20 NOTE — Anesthesia Procedure Notes (Signed)
Procedure Name: Intubation Date/Time: 02/20/2016 7:42 AM Performed by: Everlean Cherry A Pre-anesthesia Checklist: Emergency Drugs available, Suction available, Patient identified and Patient being monitored Patient Re-evaluated:Patient Re-evaluated prior to inductionOxygen Delivery Method: Circle system utilized Preoxygenation: Pre-oxygenation with 100% oxygen Intubation Type: IV induction Ventilation: Mask ventilation without difficulty Laryngoscope Size: Miller and 2 Grade View: Grade I Nasal Tubes: Right, Nasal prep performed, Nasal Rae and Magill forceps- large, utilized Tube size: 7.0 mm Number of attempts: 1 Placement Confirmation: ETT inserted through vocal cords under direct vision,  positive ETCO2 and breath sounds checked- equal and bilateral ETT to lip (cm): At bend in RAE tube. Tube secured with: Tape Dental Injury: Teeth and Oropharynx as per pre-operative assessment

## 2016-02-20 NOTE — Progress Notes (Signed)
PRE-OPERATIVE NOTE:  02/20/2016 Heather Lorenzo-Rendon DO:[id number]  VITALS: BP 126/86   Pulse 73   Temp 99.1 F (37.3 C) (Oral)   Resp 20   Ht 5\' 5"  (1.651 m)   Wt 159 lb 9.6 oz (72.4 kg)   SpO2 99%   BMI 26.56 kg/m   Lab Results  Component Value Date   WBC 6.1 02/11/2016   HGB 12.5 02/11/2016   HCT 36.6 02/11/2016   MCV 89.7 02/11/2016   PLT 240 02/11/2016   BMET    Component Value Date/Time   NA 138 02/11/2016 0744   K 3.8 02/11/2016 0744   CL 104 02/11/2016 0744   CO2 25 02/11/2016 0744   GLUCOSE 98 02/11/2016 0744   BUN 12 02/11/2016 0744   CREATININE 0.58 02/11/2016 0744   CALCIUM 9.4 02/11/2016 0744   GFRNONAA >60 02/11/2016 0744   GFRAA >60 02/11/2016 0744    Lab Results  Component Value Date   INR 0.96 02/11/2016   No results found for: PTT   Heather Strickland presents for multiple dental extractions with alveoloplasty and initial periodontal therapy in the operating room with general anesthesia.    SUBJECTIVE: The patient denies any acute medical or dental changes and agrees to proceed with treatment as planned.  EXAM: No sign of acute dental changes.  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   PLAN: Patient agrees to proceed with treatment as planned in the operating room as previously discussed and accepts the risks, benefits, and complications of the proposed treatment. Patient is aware of the risk for bleeding, bruising, swelling, infection, pain, nerve damage, soft tissue damage, damage to adjacent teeth, sinus involvement, root tip fracture, mandible fracture, and the risks of complications associated with the anesthesia. Patient also is aware of the potential for other complications not mentioned above.   Lenn Cal, DDS

## 2016-02-20 NOTE — H&P (Signed)
02/20/2016  Patient:            Heather Strickland Date of Birth:  [birth year] MRN:                [id number]   BP 126/86   Pulse 73   Temp 99.1 F (37.3 C) (Oral)   Resp 20   Ht _0  (1.651 m)   Wt 159 lb 9.6 oz (72.4 kg)   SpO2 99%   BMI 26.56 kg/m    Bertie Wah erythema with recent diagnosis of cancer of the tonsil. Patient with anticipated multiple extractions with alveoloplasty and initial periodontal therapy in the operating room with general anesthesia. Patient denies any acute medical or dental changes. Please use Dr. Pearlie Oyster note of 02-13-2016 to act as the H&P for the dental operating room procedure.  Lenn Cal, DDS  Eppie Gibson, MD  Radiation Oncology  Expand All Collapse All   _1 [facility] copied text  Radiation Oncology         [phone] ________________________________  Initial Outpatient Consultation  Name: Heather Strickland      MRN: [id number]         Date: 02/13/2016                        DOB: Dec 08, 1975  VJ:KQASUOR McElroy, PA-C  Penland, Kelby Fam, MD   REFERRING PHYSICIAN: Patrici Ranks, MD  DIAGNOSIS:    ICD-9-CM ICD-10-CM   1. 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 female who presented with a 5-6 month history of severe sore throat. She was initially referred to Dr. Benjamine Mola by [name], NP. The patient never sought treatment for this issue before. On physical exam by Dr. Benjamine Mola, he noted 3+  ulceration and erythema on the left tonsil.  On flexible laryngoscopy, tonsillar hypertrophy with ulceration was noted on the left.    Biopsy of Left Tonsil on 01/15/16 with Dr. Benjamine Mola revealed: poorly differentiated carcinoma consistent with invasive squamous cell carcinoma, HPV positive.  Pertinent imaging thus far includes neck CT performed on 01/30/16 revealing a pharyngeal mass centered in the left palatini tonsil with surrounding mucosal thickening as described below probably representing neoplasm. Mucosal thickening of the left aspect of hypopharynx and pharynx with inferior-most extension to the margin of the supraglottic airway without appreciable invasion into epiglottis, aryepiglottic folds, vocal cords, or paraglottic fat. Superior most extension of mucosal thickening to the left aspect of the uvula and along the left aspect of the soft palate to the hard/soft palate junction noted. Anteriorly there is effacement of left glossopharyngeal sulcus without appreciable tongue base invasion. Asymmetric enlargement of left-sided level 2 and 3 cervical lymph nodes possibly metastatic. No evidence for lymph node necrosis or extra nodal extension.  PET scan on 02/06/16 shows highly hypermetabolic left palatine tonsillar mass as well as a left station IIA lymph node measure 1.1 cm in short axis, mildly enlarged.  I have reviewed the images.  The patient has  met with Dr. Whitney Muse to discuss systemic treatment, and is referred to our clinic today to discuss the role that radiation may play in the treatment of her disease. The patient was offered a Optometrist, but has elected for her daughter-in-law to translate during this encounter. We are joined during this encounter by Gayleen Orem, RN, Head and Neck Nurse Navigator.  On review of systems, the patient reports she has lost a few pounds over the past few weeks. She denies swallowing issues at this time, though the patient has met with Swallowing Therapy at  [facility]. She reports some soreness in her throat. The patient had a PEG placed 02/11/16. She is planned to receive chemotherapy at [facility] with Dr. Whitney Muse.  PREVIOUS RADIATION THERAPY: No  PAST MEDICAL HISTORY:  has a past medical history of Anemia; GERD (gastroesophageal reflux disease); and Squamous cell carcinoma of left tonsil ([place]) (01/27/2016).    PAST SURGICAL HISTORY:      Past Surgical History:  Procedure Laterality Date  . CESAREAN SECTION  [id number]  . ESOPHAGOGASTRODUODENOSCOPY N/A 11/07/2015   Dr. Gala Romney: normal esophagus, chronic gastritis   . IR GENERIC HISTORICAL  02/11/2016   IR FLUORO GUIDE PORT INSERTION RIGHT 02/11/2016 Sandi Mariscal, MD [facility]INTER[facility]  . IR GENERIC HISTORICAL  02/11/2016   IR US GUIDE VASC ACCESS RIGHT 02/11/2016 Sandi Mariscal, MD [facility]  . IR GENERIC HISTORICAL  02/11/2016   IR GASTROSTOMY TUBE MOD SED 02/11/2016 Sandi Mariscal, MD [facility]    FAMILY HISTORY: family history includes ADD / ADHD in her daughter; Cerebral palsy in her son; Diabetes in her mother; Hypertension in her mother; Prostatitis in her father.  SOCIAL HISTORY:  reports that she has never smoked. She has never used smokeless tobacco. She reports that she does not drink alcohol or use drugs. The patient lives in [place].  ALLERGIES: Patient has no known allergies.  MEDICATIONS:        Current Outpatient Prescriptions  Medication Sig Dispense Refill  . ferrous sulfate 325 (65 FE) MG tablet Take 1 tablet (325 mg total) by mouth daily. 30 tablet 0  . levonorgestrel (MIRENA) 20 MCG/24HR IUD 1 each by Intrauterine route once.    . Multiple Vitamins-Minerals (MULTIVITAMIN WITH MINERALS) tablet Take 1 tablet by mouth daily.    Marland Kitchen oxyCODONE-acetaminophen (PERCOCET/ROXICET) 5-325 MG tablet Take 1 tablet by mouth every 4 (four) hours as needed for severe pain.    . pantoprazole (PROTONIX) 40 MG tablet Take 1 tablet (40 mg total) by mouth 2 (two) times daily before a  meal. Tome una tableta por boca diaria 60 tablet 3   No current facility-administered medications for this encounter.     REVIEW OF SYSTEMS:  A 10+ POINT REVIEW OF SYSTEMS WAS OBTAINED including neurology, dermatology, psychiatry, cardiac, respiratory, lymph, extremities, GI, GU, Musculoskeletal, constitutional, breasts, reproductive, HEENT.  All pertinent positives are noted in the HPI.  All others are negative.   PHYSICAL EXAM:  height is _0  (1.651 m) and weight is 159 lb 9.6 oz (72.4 kg). Her temperature is 98.9 F (37.2 C). Her blood pressure is 114/63 and her pulse is 71. Her oxygen saturation is 98%.   General: Alert and oriented, in no acute distress. HEENT: EOMI. Oral cavity demonstrates no thrush, and mucous membranes are clear. Grossly teeth are in good repair. She has a left tonsil tumor which is on external exam approximately 2 cm in greatest dimension from what can be appreciated. It does not cross midline.  Tongue is midline. Vascular: PAC is placed in right upper chest. Neck: No obvious palpable lymphadenopathy in the cervical or supraclavicular regions. Heart: Regular in rate and rhythm with no murmurs. Chest: Clear to auscultation bilaterally. Abdomen: PEG tube intact with clean dressings. Abdomen was not palpated at this time. Extremities: No edema in extremities. Lymphatics: see Neck Exam Skin: No concerning lesions. Musculoskeletal: symmetric strength throughout. Neurologic: EOMI. Rapidly alternating movements are intact.  ECOG = 1  0 - Asymptomatic (Fully active, able to carry on all predisease activities without restriction)  1 - Symptomatic but completely ambulatory (Restricted in physically strenuous activity but ambulatory and able to carry out work of a light or sedentary nature. For example, light housework, office work)  2 - Symptomatic, <50% in bed during the day (Ambulatory and capable of all self care but unable to carry out any work activities. Up  and about more than 50% of waking hours)  3 - Symptomatic, >50% in bed, but not bedbound (Capable of only limited self-care, confined to bed or chair 50% or more of waking hours)  4 - Bedbound (Completely disabled. Cannot carry on any self-care. Totally confined to bed or chair)  5 - Death   Eustace Pen MM, Creech RH, Tormey DC, et al. [phone]). "Toxicity and response criteria of the [facility]". [place] Oncol. 5 (6): 649-55   LABORATORY DATA:  RecentLabs       Lab Results  Component Value Date   WBC 6.1 02/11/2016   HGB 12.5 02/11/2016   HCT 36.6 02/11/2016   MCV 89.7 02/11/2016   PLT 240 02/11/2016     CMP     Labs(Brief)          Component Value Date/Time   NA 138 02/11/2016 0744   K 3.8 02/11/2016 0744   CL 104 02/11/2016 0744   CO2 25 02/11/2016 0744   GLUCOSE 98 02/11/2016 0744   BUN 12 02/11/2016 0744   CREATININE 0.58 02/11/2016 0744   CALCIUM 9.4 02/11/2016 0744   PROT 7.9 01/27/2016 1739   ALBUMIN 4.5 01/27/2016 1739   AST 17 01/27/2016 1739   ALT 15 01/27/2016 1739   ALKPHOS 60 01/27/2016 1739   BILITOT 0.6 01/27/2016 1739   GFRNONAA >60 02/11/2016 0744   GFRAA >60 02/11/2016 0744       RecentLabs  No results found for: TSH      RADIOGRAPHY:  ImagingResults  Ct Soft Tissue Neck W Contrast  Result Date: 01/30/2016 CLINICAL DATA:  41 y/o F; left tonsil squamous cell carcinoma for staging. EXAM: CT NECK WITH CONTRAST TECHNIQUE: Multidetector CT imaging of the neck was performed using the standard protocol following the bolus administration of intravenous contrast. CONTRAST:  157m ISOVUE-300 IOPAMIDOL (ISOVUE-300) INJECTION 61% COMPARISON:  None. FINDINGS: Pharynx and larynx: There is asymmetric enlargement of the left palatini tonsil with soft tissue thickening extending superiorly to the uvula and the left aspect of the soft palate up to the margin of the soft palate and hard palate (series 9,  image 59 and series 10, image 42). Inferiorly soft tissue thickening occupies the left lateral, anterior, and posterior aspects of the pharyngeal and hypopharyngeal wall. The inferior most extent of soft tissue thickening appears to reach the margin of the supraglottis without invasion of the supraglottic or glottic structures (series 10, image 56 and series 7, image 39). Anteriorly there is effacement of the left glossopharyngeal sulcus but no appreciable invasion of the base of tongue. There is no  effacement of parapharyngeal or prevertebral fat. Salivary glands: No inflammation, mass, or stone. Thyroid: Normal. Lymph nodes: Lymph nodes in the left-sided 2 and 3 cervical lymph node levels are mildly asymmetrically enlarged in comparison with the contralateral neck (coronal image 61 and 65). The largest lymph node is the jugular digastric node the which measures 14 x 12 x 26 mm (AP x ML x CC) series 7, image 36 and series 10, image 61. The lymph nodes are well circumscribed without evidence for necrosis or extranodal extension. Vascular: Negative. Limited intracranial: Negative. Visualized orbits: Negative. Mastoids and visualized paranasal sinuses: Clear. Skeleton: No acute or aggressive process. Upper chest: Negative. Other: None. IMPRESSION: Motion artifact at the level of oropharynx and base of tongue. Fine soft tissue details lost at these levels. Pharyngeal mass centered in the left palatini tonsil with surround mucosal thickening as described below probably representing neoplasm. Mucosal thickening of the left aspect of hypopharynx and pharynx with inferior most extension to the margin of the supraglottic airway without appreciable invasion into epiglottis, aryepiglottic folds, vocal cords, or paraglottic fat. Superior most extension of mucosal thickening to the left aspect of the uvula and along the left aspect of the soft palate to the hard/soft palate junction, this region is obscured by motion artifact.  Anteriorly there is effacement of left glossopharyngeal sulcus without appreciable tongue base invasion, this region is obscured by motion artifact. Asymmetric enlargement of left-sided level 2 and 3 cervical lymph nodes possibly metastatic. No evidence for lymph node necrosis or extra nodal extension. Electronically Signed   By: Kristine Garbe M.D.   On: 01/30/2016 00:40   Ct Chest W Contrast  Result Date: 01/30/2016 CLINICAL DATA:  Left tonsillar cancer, for staging EXAM: CT CHEST WITH CONTRAST TECHNIQUE: Multidetector CT imaging of the chest was performed during intravenous contrast administration. CONTRAST:  117m ISOVUE-300 IOPAMIDOL (ISOVUE-300) INJECTION 61% COMPARISON:  None. FINDINGS: Cardiovascular: Heart is normal in size.  No pericardial effusion. No evidence of thoracic aortic aneurysm. Mediastinum/Nodes: No suspicious mediastinal lymphadenopathy. Triangular soft tissue in the anterior mediastinum (series 2/image 30) likely reflects residual thymus. Visualized thyroid is unremarkable. Lungs/Pleura: No suspicious pulmonary nodules. Calcified granuloma in the superior segment right lower lobe (series 3/ image 50), benign. No focal consolidation. No pleural effusion or pneumothorax. Upper Abdomen: Visualized upper abdomen is notable for a 7 mm cyst in the inferior right hepatic lobe (series 2/ image 137). Musculoskeletal: Visualized osseous structures are within normal limits. IMPRESSION: No evidence of metastatic disease in the chest. Electronically Signed   By: SJulian HyM.D.   On: 01/30/2016 07:47   Ir Gastrostomy Tube Mod Sed  Result Date: 02/11/2016 INDICATION: History of the vessel carcinoma of the tonsil. Please perform percutaneous gastrostomy tube placement prior to the initiation of chemoradiation. EXAM: PULL TROUGH GASTROSTOMY TUBE PLACEMENT COMPARISON:  PET-CT - 02/06/2016 MEDICATIONS: Ancef 2 gm IV; Antibiotics were administered within 1 hour of the procedure.  Glucagon 1 mg IV CONTRAST:  30 mL of Omnipaque 300 administered into the gastric lumen. ANESTHESIA/SEDATION: Moderate (conscious) sedation was employed during this procedure. A total of Versed 3 mg and Fentanyl 50 mcg was administered intravenously. Moderate Sedation Time: 10 minutes. The patient's level of consciousness and vital signs were monitored continuously by radiology nursing throughout the procedure under my direct supervision. FLUOROSCOPY TIME:  1 minute 24 seconds (16 mGy) COMPLICATIONS: None immediate. PROCEDURE: Informed written consent was obtained from the patient (via the use of a medical translator) following explanation of the procedure, risks,  benefits and alternatives. A time out was performed prior to the initiation of the procedure. Ultrasound scanning was performed to demarcate the edge of the left lobe of the liver. Maximal barrier sterile technique utilized including caps, mask, sterile gowns, sterile gloves, large sterile drape, hand hygiene and Betadine prep. The left upper quadrant was sterilely prepped and draped. An oral gastric catheter was inserted into the stomach under fluoroscopy. The existing nasogastric feeding tube was removed. The left costal margin and barium / air opacified transverse colon were identified and avoided. Air was injected into the stomach for insufflation and visualization under fluoroscopy. Under sterile conditions a 17 gauge trocar needle was utilized to access the stomach percutaneously beneath the left subcostal margin after the overlying soft tissues were anesthetized with 1% Lidocaine with epinephrine. Needle position was confirmed within the stomach with aspiration of air and injection of small amount of contrast. A single T tack was deployed for gastropexy. Over an Amplatz guide wire, a 9-French sheath was inserted into the stomach. A snare device was utilized to capture the oral gastric catheter. The snare device was pulled retrograde from the stomach  up the esophagus and out the oropharynx. The 20-French pull-through gastrostomy was connected to the snare device and pulled antegrade through the oropharynx down the esophagus into the stomach and then through the percutaneous tract external to the patient. The gastrostomy was assembled externally. Contrast injection confirms position in the stomach. Several spot radiographic images were obtained in various obliquities for documentation. The patient tolerated procedure well without immediate post procedural complication. FINDINGS: After successful fluoroscopic guided placement, the gastrostomy tube is appropriately positioned with internal disc against the ventral aspect of the gastric lumen. IMPRESSION: Successful fluoroscopic insertion of a 20-French pull-through gastrostomy tube. The gastrostomy may be used immediately for medication administration and in 24 hrs for the initiation of feeds. Electronically Signed   By: Sandi Mariscal M.D.   On: 02/11/2016 13:59   Nm Pet Image Initial (pi) Skull Base To Thigh  Result Date: 02/06/2016 CLINICAL DATA:  Initial treatment strategy for left tonsillar squamous cell carcinoma. EXAM: NUCLEAR MEDICINE PET SKULL BASE TO THIGH TECHNIQUE: 11.9 mCi F-18 FDG was injected intravenously. Full-ring PET imaging was performed from the skull base to thigh after the radiotracer. CT data was obtained and used for attenuation correction and anatomic localization. FASTING BLOOD GLUCOSE:  Value: 93 mg/dl COMPARISON:  CT neck from 01/29/2016 FINDINGS: NECK The left palatine tonsillar mass shown on recent CT has a maximum SUV of 38.5. A left station IIa lymph node measures 1.1 cm in short axis on image 38/3 but maximum SUV is only 3.5. Contralateral structures in this vicinity have a maximum SUV of 3.0 and accordingly this node is only very minimally above the contralateral activity. CHEST No hypermetabolic mediastinal or hilar nodes. No suspicious pulmonary nodules on the CT scan. There  is a calcified granuloma in the superior segment right lower lobe without associated metabolic activity. Mild cardiomegaly. ABDOMEN/PELVIS No abnormal hypermetabolic activity within the liver, pancreas, adrenal glands, or spleen. No hypermetabolic lymph nodes in the abdomen or pelvis. Incidental note is made of an IUD satisfactorily positioned in the endometrial canal. SKELETON No focal hypermetabolic activity to suggest skeletal metastasis. IMPRESSION: 1. Highly hypermetabolic left palatine tonsillar mass, maximum SUV 38.5. 2. A left station IIa lymph node measure 1.1 cm in short axis, mildly enlarged on image 38/3, but has only a borderline elevated SUV at 3.5. 3. Other imaging findings of potential clinical significance:  Mild cardiomegaly. Calcified granuloma in the superior segment right lower lobe (benign). IUD satisfactorily positioned in the uterus. Electronically Signed   By: Van Clines M.D.   On: 02/06/2016 12:17   Ir US Guide Vasc Access Right  Result Date: 02/11/2016 INDICATION: History of squamous cell carcinoma of the left tonsil. In need of durable intravenous access for chemotherapy administration. EXAM: IMPLANTED PORT A CATH PLACEMENT WITH ULTRASOUND AND FLUOROSCOPIC GUIDANCE COMPARISON:  PET-CT - 02/06/2016 MEDICATIONS: Ancef 2 gm IV; The antibiotic was administered within an appropriate time interval prior to skin puncture. ANESTHESIA/SEDATION: Moderate (conscious) sedation was employed during this procedure. A total of Versed 3 mg and Fentanyl 50 mcg was administered intravenously. Moderate Sedation Time: 26 minutes. The patient's level of consciousness and vital signs were monitored continuously by radiology nursing throughout the procedure under my direct supervision. CONTRAST:  None FLUOROSCOPY TIME:  12 seconds (4 mGy) COMPLICATIONS: None immediate. PROCEDURE: The procedure, risks, benefits, and alternatives were explained to the patient. Questions regarding the procedure were  encouraged and answered. The patient understands and consents to the procedure. The right neck and chest were prepped with chlorhexidine in a sterile fashion, and a sterile drape was applied covering the operative field. Maximum barrier sterile technique with sterile gowns and gloves were used for the procedure. A timeout was performed prior to the initiation of the procedure. Local anesthesia was provided with 1% lidocaine with epinephrine. After creating a small venotomy incision, a micropuncture kit was utilized to access the internal jugular vein under direct, real-time ultrasound guidance. Ultrasound image documentation was performed. The microwire was kinked to measure appropriate catheter length. A subcutaneous port pocket was then created along the upper chest wall utilizing a combination of sharp and blunt dissection. The pocket was irrigated with sterile saline. A single lumen ISP power injectable port was chosen for placement. The 8 Fr catheter was tunneled from the port pocket site to the venotomy incision. The port was placed in the pocket. The external catheter was trimmed to appropriate length. At the venotomy, an 8 Fr peel-away sheath was placed over a guidewire under fluoroscopic guidance. The catheter was then placed through the sheath and the sheath was removed. Final catheter positioning was confirmed and documented with a fluoroscopic spot radiograph. The port was accessed with a Huber needle, aspirated and flushed with heparinized saline. The venotomy site was closed with an interrupted 4-0 Vicryl suture. The port pocket incision was closed with interrupted 2-0 Vicryl suture and the skin was opposed with a running subcuticular 4-0 Vicryl suture. Dermabond and Steri-strips were applied to both incisions. Dressings were placed. The patient tolerated the procedure well without immediate post procedural complication. FINDINGS: After catheter placement, the tip lies within the superior cavoatrial  junction. The catheter aspirates and flushes normally and is ready for immediate use. IMPRESSION: Successful placement of a right internal jugular approach power injectable Port-A-Cath. The catheter is ready for immediate use. Electronically Signed   By: Sandi Mariscal M.D.   On: 02/11/2016 12:27   Ir Fluoro Guide Port Insertion Right  Result Date: 02/11/2016 INDICATION: History of squamous cell carcinoma of the left tonsil. In need of durable intravenous access for chemotherapy administration. EXAM: IMPLANTED PORT A CATH PLACEMENT WITH ULTRASOUND AND FLUOROSCOPIC GUIDANCE COMPARISON:  PET-CT - 02/06/2016 MEDICATIONS: Ancef 2 gm IV; The antibiotic was administered within an appropriate time interval prior to skin puncture. ANESTHESIA/SEDATION: Moderate (conscious) sedation was employed during this procedure. A total of Versed 3 mg and Fentanyl 50  mcg was administered intravenously. Moderate Sedation Time: 26 minutes. The patient's level of consciousness and vital signs were monitored continuously by radiology nursing throughout the procedure under my direct supervision. CONTRAST:  None FLUOROSCOPY TIME:  12 seconds (4 mGy) COMPLICATIONS: None immediate. PROCEDURE: The procedure, risks, benefits, and alternatives were explained to the patient. Questions regarding the procedure were encouraged and answered. The patient understands and consents to the procedure. The right neck and chest were prepped with chlorhexidine in a sterile fashion, and a sterile drape was applied covering the operative field. Maximum barrier sterile technique with sterile gowns and gloves were used for the procedure. A timeout was performed prior to the initiation of the procedure. Local anesthesia was provided with 1% lidocaine with epinephrine. After creating a small venotomy incision, a micropuncture kit was utilized to access the internal jugular vein under direct, real-time ultrasound guidance. Ultrasound image documentation was  performed. The microwire was kinked to measure appropriate catheter length. A subcutaneous port pocket was then created along the upper chest wall utilizing a combination of sharp and blunt dissection. The pocket was irrigated with sterile saline. A single lumen ISP power injectable port was chosen for placement. The 8 Fr catheter was tunneled from the port pocket site to the venotomy incision. The port was placed in the pocket. The external catheter was trimmed to appropriate length. At the venotomy, an 8 Fr peel-away sheath was placed over a guidewire under fluoroscopic guidance. The catheter was then placed through the sheath and the sheath was removed. Final catheter positioning was confirmed and documented with a fluoroscopic spot radiograph. The port was accessed with a Huber needle, aspirated and flushed with heparinized saline. The venotomy site was closed with an interrupted 4-0 Vicryl suture. The port pocket incision was closed with interrupted 2-0 Vicryl suture and the skin was opposed with a running subcuticular 4-0 Vicryl suture. Dermabond and Steri-strips were applied to both incisions. Dressings were placed. The patient tolerated the procedure well without immediate post procedural complication. FINDINGS: After catheter placement, the tip lies within the superior cavoatrial junction. The catheter aspirates and flushes normally and is ready for immediate use. IMPRESSION: Successful placement of a right internal jugular approach power injectable Port-A-Cath. The catheter is ready for immediate use. Electronically Signed   By: Simonne Come M.D.   On: 02/11/2016 12:27       IMPRESSION/PLAN: This is a delightful patient with tonsillar head and neck cancer. I would recommend 7 weeks of curative radiotherapy for this patient.  We discussed the potential risks, benefits, and side effects of radiotherapy. We talked in detail about acute and late effects. We discussed that some of the most bothersome  acute effects may be mucositis, dysgeusia, salivary changes, skin irritation, hair loss, dehydration, weight loss and fatigue. We talked about late effects which include but are not necessarily limited to dysphagia, hypothyroidism, nerve injury, spinal cord injury, xerostomia, trismus, and neck edema. No guarantees of treatment were given. A consent form was signed and placed in the patient's medical record. The patient is enthusiastic about proceeding with treatment. I look forward to participating in the patient's care.    Patient's daughter in law appears very supportive today. She provided translation; patient prefers this, and has declined other forms of translation in the health system in the past.  The patient will be referred back to [place] for CT Simulation (treatment planning) following consultation with dentistry.   We also discussed that the treatment of head and neck cancer is  a multidisciplinary process to maximize treatment outcomes and quality of life. For this reason the following referrals have been or will be made:  1) Nutritionist for nutrition support during and after treatment at [facility] - Dr Whitney Muse expressed intent to refer.. We discussed drinking nutrition shakes such as Boost or Ensure, and encouraged the patient to drink or instill at least 2 cans a day to maintain her weight.  2) Physical therapy due to risk of lymphedema in neck and deconditioning. Dr Whitney Muse expressed intent to refer at [facility].  3) Baseline labs including TSH. We also discussed the need for yearly thyroid function monitoring.  The patient is scheduled to see a dentist today to discuss any necessary extractions prior to radiotherapy. We discussed that following with a dentist will also provide advice on reducing risk of cavities, osteoradionecrosis, or other oral issues. Additionally, we discussed that the patient should perform jaw exercises as prescribed by the dentist.  The patient is already following  with Swallowing Therapy at [facility], and I strongly encouraged her to continue with this in order to prevent future swallowing issues from arising.  Of note, she is not pregnant, per test last week at [facility], and uses an IUD.  ADDENDUM 02-18-16: patient discussed at tumor board.  Lesion is felt to be on the upper edge of T2 rather than T3.  This is a more favorable prognosis and warrants considering definitive RT rather than ChRT.  I discussed this with Kirby Crigler PA who will discuss further with the MD at [facility] when she returns to clinic next week. __________________________________________   Eppie Gibson, MD

## 2016-02-20 NOTE — Op Note (Signed)
OPERATIVE REPORT  Patient:            Heather Strickland Date of Birth:  1975-08-23 MRN:                [id number]   DATE OF PROCEDURE:  02/20/2016  PREOPERATIVE DIAGNOSES: 1. Squamous cell carcinoma of the left tonsil 2. Pre-chemoradiation therapy dental protocol 3. Dental caries 4. Chronic periodontitis 5. Accretions  POSTOPERATIVE DIAGNOSES: 1. Squamous cell carcinoma of the left tonsil 2. Pre-chemoradiation therapy dental protocol 3. Dental caries 4. Chronic periodontitis 5. Accretions  OPERATIONS: 1. Multiple extraction of tooth numbers 15, 17, 18, and 32 2. Three Quadrants of alveoloplasty 3. Adult prophylaxis   SURGEON: Lenn Cal, DDS  ASSISTANT: Camie Patience, (dental assistant)  ANESTHESIA: General anesthesia via nasoendotracheal tube.  MEDICATIONS: 1. Ancef 2 g IV prior to invasive dental procedures. 2. Local anesthesia with a total utilization of 3 carpules each containing 34 mg of lidocaine with 0.017 mg of epinephrine as well as 2 carpules each containing 9 mg of bupivacaine with 0.009 mg of epinephrine.  SPECIMENS: There are 4 teeth that were discarded.  DRAINS: None  CULTURES: None  COMPLICATIONS: None   ESTIMATED BLOOD LOSS: 100 mLs.  INTRAVENOUS FLUIDS: 1300 mLs of Lactated ringers solution.  INDICATIONS: The patient was recently diagnosed with squamous cell carcinoma of the left tonsil.  A medically necessary dental consultation was then requested to evaluate poor dentition.  The patient was examined and treatment planned for extraction of tooth numbers 15, 17, 18, and 32 with alveoloplasty along with dental cleaning of remaining teeth in the operating room with general anesthesia.  This treatment plan was formulated to decrease the risks and complications associated with dental infection from affecting the patient's systemic health and to prevent future complications such as osteoradionecrosis.  OPERATIVE FINDINGS: Patient was  examined operating room number 2.  The teeth were identified for extraction. The patient was noted be affected by chronic periodontitis, accretions, and dental caries.  DESCRIPTION OF PROCEDURE: Patient was brought to the main operating room number 2. Patient was then placed in the supine position on the operating table. General anesthesia was then induced per the anesthesia team. The patient was then prepped and draped in the usual manner for dental medicine procedure. A timeout was performed. The patient was identified and procedures were verified. A throat pack was placed at this time. The oral cavity was then thoroughly examined with the findings noted above. The patient was then ready for dental medicine procedure as follows:  Local anesthesia was then administered sequentially with a total utilization of 3 carpules each containing 34 mg of lidocaine with 0.017 mg of epinephrine as well as 2 carpules  each containing 9 mg bupivacaine with 0.009 mg of epinephrine.  The Maxillary left quadrant was first approached. Anesthesia was then delivered utilizing infiltration with lidocaine with epinephrine. A #15 blade incision was then made from the maxillary left tuberosity and extended to the mesial of #14. A releasing incision was then made at the mesial of #14.  A  surgical flap was then carefully reflected. The tooth #15 was then subluxated with a series of straight elevators. Appropriate amounts of buccal and interseptal bone were then removed utilizing a surgical handpiece and bur and copious amounts of sterile water around tooth #15.  The tooth were then subluxated with a series of straight elevators.  The coronal aspect of tooth #15 was then removed with a 53R forceps leaving roots remaining. The surgical  handpiece and bur and copious amounts sterile water were used to remove bone around the roots and the roots were then elevated out without further complication.  Alveoloplasty was then performed  utilizing a ronguers and bone file to help achieve primary closure. The surgical site was then irrigated with copious amounts of sterile saline. The tissues were approximated and trimmed appropriately. The surgical site was then closed from the  maxillary left tuberosity and extended to the distal of #14 utilizing 3-0 chromic gut suture in a continuous interrupted suture technique 1. The releasing incision was then closed utilizing 3 interrupted sutures utilizing 4-0 chromic gut material.   At this point time, the mandibular quadrants were approached. The patient was given bilateral inferior alveolar nerve blocks and long buccal nerve blocks utilizing the bupivacaine with epinephrine. Further infiltration was then achieved utilizing the lidocaine with epinephrine. A 15 blade incision was then made from the distal of number  #17 and extended to the mesial of #19.  A surgical flap was then carefully reflected. Appropriate amounts of buccal and interseptal bone were then removed utilizing a surgical handpiece and copious amount of sterile water around tooth numbers 17 and 18. The teeth were then subluxated with a straight elevators. Tooth #17 was then removed with a 151 forceps without complications. The coronal aspect of tooth #18 was then removed utilizing a 23 forceps leaving roots remaining. Further bone was then removed with a surgical handpiece and bur and copious amounts sterile water around retained roots. These roots were then elevated out with a series of cryers elevators. Alveoloplasty was then performed utilizing a rongeurs and bone file. The tissues were approximated and trimmed appropriately. The surgical sites were then irrigated with copious amounts of sterile saline. A piece of Surgifoam was placed in the surgical extraction sockets appropriately. The mandibular left surgical site was then closed from the distal of  17 and extended to the distal of #19 utilizing 3-0 chromic gut suture in a  continuous interrupted suture technique 1. 2 individual interrupted sutures then placed to further closed surgical site utilizing 3-0 chromic gut material.  At this point time the mandibular right quadrant was approached. 15 blade incision was made from the distal of #32 and extended the mesial #30. A surgical flap was then carefully reflected. Appropriate amounts of buccal and interseptal bone was removed around tooth #32 utilizing a surgical handpiece and bur and copious amounts sterile water.   Tooth #32 was then subluxated with a series straight elevators and removed with a 151 forceps without complications. Alveoloplasty was performed utilizing a rongeur and bone file to help achieve primary closure.  The surgical site was irrigated with copious amounts of sterile saline.  A piece of Surgicel was placed in the extraction socket.  The mandibular right surgical site was then closed from the distal of 32 and extended to the distal of #31 utilizing 3-0 chromic gut suture in a continuous interrupted suture technique x1.  An interproximal suture was then placed between tooth numbers 30 and 31.    At this point in time, the remaining dentition was approached. An adult prophylaxis was performed utilizing a KaVo sonic scaler. A series of hand curettes were then used to further remove accretions.   At this point time, the entire mouth with copious amounts of sterile saline. The patient was examined for complications, seeing none, the dental medicine procedure was deemed to be complete. The throat pack was removed at this time. An oral airway was then  placed at the request of the anesthesia team. A series of 4 x 4 gauze were placed in the mouth to aid hemostasis. The patient was then handed over to the anesthesia team for final disposition. After an appropriate amount of time, the patient was extubated and taken to the postanesthsia care unit in good condition. All counts were correct for the dental medicine  procedure.   Lenn Cal, DDS.

## 2016-02-21 ENCOUNTER — Encounter (HOSPITAL_COMMUNITY): Payer: Self-pay | Admitting: Dentistry

## 2016-02-23 ENCOUNTER — Telehealth (HOSPITAL_COMMUNITY): Payer: Self-pay | Admitting: Emergency Medicine

## 2016-02-23 ENCOUNTER — Other Ambulatory Visit (HOSPITAL_COMMUNITY): Payer: Self-pay | Admitting: Oncology

## 2016-02-23 ENCOUNTER — Ambulatory Visit
Admission: RE | Admit: 2016-02-23 | Discharge: 2016-02-23 | Disposition: A | Discharge status: Home / Self Care | Payer: Self-pay | Source: Ambulatory Visit | Attending: Radiation Oncology | Admitting: Radiation Oncology

## 2016-02-23 VITALS — BP 133/87 | HR 71 | Temp 99.8°F | Resp 12 | Wt 157.6 lb

## 2016-02-23 DIAGNOSIS — C099 Malignant neoplasm of tonsil, unspecified: Secondary | ICD-10-CM

## 2016-02-23 DIAGNOSIS — R634 Abnormal weight loss: Secondary | ICD-10-CM

## 2016-02-23 LAB — TSH: TSH: 0.36 m[IU]/L (ref 0.308–3.960)

## 2016-02-23 MED ORDER — SODIUM CHLORIDE 0.9% FLUSH
10.0000 mL | INTRAVENOUS | Status: DC | PRN
  Administered 2016-02-23: 10 mL via INTRAVENOUS

## 2016-02-23 NOTE — Telephone Encounter (Signed)
Called to speak to [facility] to let her know that I made her a follow up appt to see Kirby Crigler PA on 03/25/2016 at 2:30 pm.  She will still need to follow up with the medical oncologist even though she is not receiving chemotherapy.  Dr Isidore Moos is the radiation oncologist.

## 2016-02-23 NOTE — Progress Notes (Signed)
Head and Neck Cancer Simulation, IMRT treatment planning note   Outpatient  Diagnosis:    ICD-9-CM ICD-10-CM   1. Squamous cell carcinoma of left tonsil (HCC) 146.0 C09.9    I spoke with Robynn Pane by phone today. The [facility] team has decided to hold chemotherapy given patient's limited stage and HPV+ status.  Daughter and patient informed. They would therefore like their supportive care here at [facility] in [place].  Gayleen Orem, RN, our Head and Neck Oncology Navigator will arrange cancellation of [facility] appts and supportive services will be arranged here instead.  Patient is s/p extractions (dental) and will next 2 more weeks to heal before starting RT.  The patient was taken to the CT simulator and laid in the supine position on the table. An Aquaplast head and shoulder mask was custom fitted to the patient's anatomy. High-resolution CT axial imaging was obtained of the head and neck with contrast. I verified that the quality of the imaging is good for treatment planning. 1 Medically Necessary Treatment Device was fabricated and supervised by me: Aquaplast mask.   Treatment planning note I plan to treat the patient with IMRT. I plan to treat the patient's tumor and bilateral neck nodes. I plan to treat to a total dose of 70 Gray in 35  fractions. Dose calculation was ordered from dosimetry.  IMRT planning Note  IMRT is medically necessary and an important modality to deliver adequate dose to the patient's at risk tissues while sparing the patient's normal structures, including the: esophagus, parotid tissue, mandible, brain stem, spinal cord, oral cavity, brachial plexus.  This justifies the use of IMRT in the patient's treatment.   -----------------------------------  Eppie Gibson, MD

## 2016-02-24 ENCOUNTER — Ambulatory Visit (HOSPITAL_COMMUNITY): Payer: Self-pay

## 2016-02-25 ENCOUNTER — Encounter (HOSPITAL_COMMUNITY): Payer: Self-pay

## 2016-02-25 ENCOUNTER — Telehealth: Payer: Self-pay | Admitting: Hematology and Oncology

## 2016-02-25 ENCOUNTER — Encounter: Payer: Self-pay | Admitting: Hematology and Oncology

## 2016-02-25 NOTE — Telephone Encounter (Signed)
Tc to the pt's dtr in law to schedule an medonc appt. Apt has been scheduled for the pt to see Dr. Alvy Bimler on 2/28 at 3pm. Aware to arrive at 230pm. Address verified. Letter mailed to the pt.

## 2016-02-27 ENCOUNTER — Ambulatory Visit (HOSPITAL_COMMUNITY): Payer: Self-pay | Admitting: Dentistry

## 2016-02-27 ENCOUNTER — Encounter (HOSPITAL_COMMUNITY): Payer: Self-pay | Admitting: Dentistry

## 2016-02-27 ENCOUNTER — Encounter (HOSPITAL_COMMUNITY): Payer: Self-pay

## 2016-02-27 VITALS — BP 102/64 | HR 68 | Temp 98.3°F

## 2016-02-27 DIAGNOSIS — Z463 Encounter for fitting and adjustment of dental prosthetic device: Secondary | ICD-10-CM

## 2016-02-27 DIAGNOSIS — K0889 Other specified disorders of teeth and supporting structures: Secondary | ICD-10-CM

## 2016-02-27 DIAGNOSIS — K08199 Complete loss of teeth due to other specified cause, unspecified class: Secondary | ICD-10-CM

## 2016-02-27 DIAGNOSIS — Z01818 Encounter for other preprocedural examination: Secondary | ICD-10-CM

## 2016-02-27 DIAGNOSIS — C099 Malignant neoplasm of tonsil, unspecified: Secondary | ICD-10-CM

## 2016-02-27 MED ORDER — SODIUM FLUORIDE 1.1 % DT GEL: DENTAL | 99 refills | Status: DC

## 2016-02-27 MED FILL — FLUORISHIELD 1.1% GEL: 1.1 % | 30 days supply | Qty: 114 | Fill #0

## 2016-02-27 NOTE — Progress Notes (Signed)
POST OPERATIVE NOTE:  02/27/2016 Audreanna Lorenzo-Rendon [id number]  VITALS: BP 102/64 (BP Location: Left Arm)   Pulse 68   Temp 98.3 F (36.8 C) (Oral)   LABS:  Lab Results  Component Value Date   WBC 6.1 02/11/2016   HGB 12.5 02/11/2016   HCT 36.6 02/11/2016   MCV 89.7 02/11/2016   PLT 240 02/11/2016   BMET    Component Value Date/Time   NA 138 02/11/2016 0744   K 3.8 02/11/2016 0744   CL 104 02/11/2016 0744   CO2 25 02/11/2016 0744   GLUCOSE 98 02/11/2016 0744   BUN 12 02/11/2016 0744   CREATININE 0.58 02/11/2016 0744   CALCIUM 9.4 02/11/2016 0744   GFRNONAA >60 02/11/2016 0744   GFRAA >60 02/11/2016 0744    Lab Results  Component Value Date   INR 0.96 02/11/2016   No results found for: PTT   Lorian Lorenzo-Rendon is status post Multiple extractions with alveoloplasty and gross debridement of remaining dentition in the operating room on 02/20/2016.  SUBJECTIVE: Patient with minimal complaints. Patient is using salt water rinses as instructed. Several stitches still remain by patient report.  Patient is currently only to receive radiation therapy without chemotherapy at this time.  EXAM: There is no sign of infection, heme, or ooze. Sutures are intact.  Patient is healing in by generalized primary closure but also has several areas that will need to heal in by secondary intention.  PROCEDURE: The patient was given a chlorhexidine gluconate rinse for 30 seconds. Several loose sutures were then removed without complication. Patient tolerated the procedure well.  ASSESSMENT: Post operative course is consistent with dental procedures performed in the operating room.. Loss of teeth due to extraction  PLAN: 1. Continue salt water rinses as needed to aid healing. 2. Brush teeth after meals and at bedtime. 3. Use fluoride at bedtime as instructed. Prescription for FluoriSHIELD has been sent to [facility] long outpatient pharmacy.  4. Use trismus exercises as  instructed. 5. Patient to call for an appointment in 2-3 weeks for oral examination during chemoradiation therapy  6. Patient is cleared for start of radiation therapy on 03/08/2016.  Lenn Cal, DDS

## 2016-02-27 NOTE — Patient Instructions (Addendum)
PLAN: 1. Continue salt water rinses as needed to aid healing. 2. Brush teeth after meals and at bedtime. 3. Use fluoride at bedtime as instructed. Prescription for FluoriSHIELD has been sent to [facility] long outpatient pharmacy.  4. Use trismus exercises as instructed. 5. Patient to call for an appointment in 2-3 weeks for oral examination during chemoradiation therapy  6. Patient is cleared for start of radiation therapy on 03/08/2016.  Lenn Cal, DDS    FLUORIDE TRAYS PATIENT INSTRUCTIONS    Obtain prescription from the pharmacy.  Don't be surprised if it needs to be ordered.  Be sure to let the pharmacy know when you are close to needing a new refill for them to have it ready for you without interruption of Fluoride use.  The best time to use your Fluoride is before bed time.  You must brush your teeth very well and floss before using the Fluoride in order to get the best use out of the Fluoride treatments.  Place 1 drop of Fluoride gel per tooth in the tray.  Place the tray on your lower teeth and your upper teeth.  Make sure the trays are seated all the way.  Remember, they only fit one way on your teeth.  Insert for 5 full minutes.  At the end of the 5 minutes, take the trays out.  SPIT OUT excess. .  Do NOT rinse your mouth!  Do NOT eat or drink after treatments for at least 30 minutes.  This is why the best time for your treatments is before bedtime.  Clean the inside of your Fluoride trays using COLD WATER and a toothbrush.  In order to keep your Trays from discoloring and free from odors, soak them overnight in denture cleaners such as Efferdent.  Do not use bleach or non denture products.  Store the trays in a safe dry place AWAY from any heat until your next treatment.  If anything happens to your Fluoride trays, or they don't fit as well after any dental work, please let us know as soon as possible.

## 2016-03-02 ENCOUNTER — Telehealth: Payer: Self-pay | Admitting: *Deleted

## 2016-03-02 NOTE — Telephone Encounter (Addendum)
Oncology Nurse Navigator Documentation  Received call from patient's d-in-law, Katie.  She reported mother-in-law:  Has been denied [facility] financial assistance d/t missing information on application, cannot re-apply until May.  She is financially destitute as she has been out of work since her diagnosis.  Further stated she has applied for emergency Medicaid.  I indicated I would have Arrey call her to address situation.  Needs letter for employer stating she is under physician care for cancer treatment.  Would appreciate letter stating care began when earliest procedure conducted (PAC/PEG placement 02/11/2016).   She further inquired about referral to Dr. Alvy Bimler in absence of receiving chemotherapy.  I explained Dr. Alvy Bimler may be engaged at a later date to assist with treatment symptom mgt.  Dr. Isidore Moos informed of letter request.  Gayleen Orem, RN, BSN, [place] [facility] [place] [phone]

## 2016-03-03 ENCOUNTER — Encounter (HOSPITAL_COMMUNITY): Payer: Self-pay

## 2016-03-03 ENCOUNTER — Other Ambulatory Visit: Payer: Self-pay | Admitting: *Deleted

## 2016-03-03 DIAGNOSIS — C099 Malignant neoplasm of tonsil, unspecified: Secondary | ICD-10-CM

## 2016-03-05 ENCOUNTER — Encounter (HOSPITAL_COMMUNITY): Payer: Self-pay

## 2016-03-08 ENCOUNTER — Ambulatory Visit
Admission: RE | Admit: 2016-03-08 | Discharge: 2016-03-08 | Disposition: A | Discharge status: Home / Self Care | Payer: Self-pay | Source: Ambulatory Visit | Attending: Radiation Oncology | Admitting: Radiation Oncology

## 2016-03-08 ENCOUNTER — Encounter: Payer: Self-pay | Admitting: *Deleted

## 2016-03-08 ENCOUNTER — Telehealth: Payer: Self-pay | Admitting: *Deleted

## 2016-03-08 ENCOUNTER — Inpatient Hospital Stay (HOSPITAL_COMMUNITY): Payer: Self-pay

## 2016-03-08 ENCOUNTER — Ambulatory Visit (HOSPITAL_COMMUNITY): Payer: Self-pay | Admitting: Oncology

## 2016-03-08 ENCOUNTER — Encounter: Payer: Self-pay | Admitting: Radiation Oncology

## 2016-03-08 ENCOUNTER — Ambulatory Visit: Payer: Self-pay | Admitting: Physician Assistant

## 2016-03-08 ENCOUNTER — Ambulatory Visit (HOSPITAL_COMMUNITY): Payer: Self-pay

## 2016-03-08 VITALS — BP 121/83 | HR 83 | Temp 98.4°F | Resp 12 | Wt 156.4 lb

## 2016-03-08 DIAGNOSIS — C099 Malignant neoplasm of tonsil, unspecified: Secondary | ICD-10-CM

## 2016-03-08 NOTE — Telephone Encounter (Signed)
Oncology Nurse Navigator Documentation  Spoke with Ignacia Bayley, Office of Inclusion, requested interpretor for H&N MDC tomorrow morning, requested 8:30 arrival to Radiation Waiting.  She voiced understanding.  Gayleen Orem, RN, BSN, [place] Oncology Nurse [place] [facility] [place] [phone]

## 2016-03-08 NOTE — Progress Notes (Signed)
Constipation Management   Constipation is being unable to move your bowels, having less frequent bowel movements, or having to push harder to move your bowels. In addition, certain medications (i.e. pain medications), eating or drinking less and being less active are possible causes. Keeping your bowel movements regular and easy to pass is important. A daily bowel regimen helps to prevent this potentially troublesome side effect.    What can you do about it?  Diet:  Most adults should consume a diet of 25-35 grams of fiber per day.   Foods high in fiber include:  wheat bran, whole-grain breads/cereals, fruits and vegetables (raw and uncooked with skins and peels on), popcorn and dried beans.   *If you have no appetite or problems chewing or swallowing, have ever been told that you need a low-fiber diet, low-residue diet, these food may not be recommended.    Fluid: It is important for you to stay hydrated.  You can accomplish this by drinking eight, 8oz glasses of fluids a day.  Examples of fluids are water, prune juice, popsicles, gelatin or ice cream.   *The benefits of diet and fluid intake may not be noted for several weeks, so it is important to not discontinue if you don't see immediate results.    Medications (if constipated):  Marland Kitchen MIRALAX (polyethylene glycol):  17g powder (one capful) diluted in 8 fluid ounces water, juice, soda or coffee orally once a day and take one (1) Senokot-Sr (Senna-S):  at bedtime.  . If you do not have a bowel movement in the morning, take MIRALAX (polyethylene glycol):  17 g powder (one capful) diluted in 8 fluid ounces water, juice, soda or coffee orally once a day and take one (1) Senokot-Sr (Senna-S):  in the morning and bedtime.   . If you do not have bowel movement by the next morning then take one (1) Liquid Glycerin suppository.   *IF AFTER THIS PROTOCOL YOU STILL HAVEN'T HAD A BOWEL MOVEMENT, PLEASE CALL RADIATION NURSING AT [phone].  Once  you started having bowel movements, continue to take one (1) Senokot-Sr (Senna-S):  once a day as your daily laxative protocol.  If you start having diarrhea, take only one (1) Senokot-Sr (Senna-S): every other day.  Source: Producer, television/film/video  SKIN CARE DURING RADIATION TREATMENT-HEAD AND NECK  RECOMMENDATIONS: ? Use unscented soap (Dove) ? When showering it is fine for water to touch the area, but please avoid direct spray on the treatment field.  Also, wash inside and around the marked area ? When drying gently blot the area ? Avoid using lotions, oils or powders as well as products with alcohol ? If you shave, use an electric razor and DO NOT use pre or after shave lotion  ? Moisturizer o You may be given Radiaplex Gel or Biafine (provided by nursing) to use. Apply twice daily, once after treatment and then again prior to bedtime o Your Radiation Oncologist may suggest other skin care products ? PLEASE DO NOT APPLY ANYTHING TO THE TREATMENT AREA SKIN WITH 4 HOURS  PRIOR TO RADIATION  ? Mouth care o Soothing relief: rinse your mouth every 1-2 hours with a solution of  teaspoon baking soda and 1/8 teaspoon salt mixed in 1 cup of warm water o DO NOT use mouthwashes that contain alcohol, try using BIOTENE instead     Radiation video link to watch at home:  [email or web address]

## 2016-03-08 NOTE — Progress Notes (Signed)
   Weekly Management Note:  Outpatient    ICD-9-CM ICD-10-CM   1. 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  CBCT/MVCT images/Port film x-rays were reviewed.  The chart was checked. Doing well.  Physical Findings:  Wt Readings from Last 3 Encounters:  03/08/16 156 lb 6.4 oz (70.9 kg)  02/23/16 157 lb 9.6 oz (71.5 kg)  02/20/16 159 lb 9.6 oz (72.4 kg)    weight is 156 lb 6.4 oz (70.9 kg). Her oral temperature is 98.4 F (36.9 C). Her blood pressure is 121/83 and her pulse is 83. Her respiration is 12 and oxygen saturation is 100%.  left tonsil tumor stable.  CBC    Component Value Date/Time   WBC 6.1 02/11/2016 0744   RBC 4.08 02/11/2016 0744   HGB 12.5 02/11/2016 0744   HCT 36.6 02/11/2016 0744   PLT 240 02/11/2016 0744   MCV 89.7 02/11/2016 0744   MCH 30.6 02/11/2016 0744   MCHC 34.2 02/11/2016 0744   RDW 13.3 02/11/2016 0744   LYMPHSABS 1.7 02/11/2016 0744   MONOABS 0.4 02/11/2016 0744   EOSABS 0.1 02/11/2016 0744   BASOSABS 0.0 02/11/2016 0744     CMP     Component Value Date/Time   NA 138 02/11/2016 0744   K 3.8 02/11/2016 0744   CL 104 02/11/2016 0744   CO2 25 02/11/2016 0744   GLUCOSE 98 02/11/2016 0744   BUN 12 02/11/2016 0744   CREATININE 0.58 02/11/2016 0744   CALCIUM 9.4 02/11/2016 0744   PROT 7.9 01/27/2016 1739   ALBUMIN 4.5 01/27/2016 1739   AST 17 01/27/2016 1739   ALT 15 01/27/2016 1739   ALKPHOS 60 01/27/2016 1739   BILITOT 0.6 01/27/2016 1739   GFRNONAA >60 02/11/2016 0744   GFRAA >60 02/11/2016 0744    Impression:  The patient is tolerating radiotherapy.   Plan:  Continue radiotherapy as planned.  Letter written for work.  -----------------------------------  Eppie Gibson, MD

## 2016-03-08 NOTE — Progress Notes (Signed)
PAIN: She is currently in no pain.  SWALLOWING/DIET: Pt denies dysphagia. Pt reports a regular unmodified diet orally.  H20 flush only. Peg tube site appearance-slight pink, minimal pink drainage, tender on touch. Oral exam reveals moist with white and blood streaked sputum.  BOWEL: Pt reports bowel movements daily.  SKIN: Skin exam reveals warm dry and intact. Pt education today, for Biafine cream OTHER: Feeling overall good.  WEIGHT/VS: BP 121/83   Pulse 83   Temp 98.4 F (36.9 C) (Oral)   Resp 12   Wt 156 lb 6.4 oz (70.9 kg)   SpO2 100%   BMI 26.03 kg/m  Wt Readings from Last 3 Encounters:  03/08/16 156 lb 6.4 oz (70.9 kg)  02/23/16 157 lb 9.6 oz (71.5 kg)  02/20/16 159 lb 9.6 oz (72.4 kg)   Orthostatic: BP: 118/83 P:92 Pox:100%

## 2016-03-09 ENCOUNTER — Ambulatory Visit: Payer: Self-pay | Admitting: Physical Therapy

## 2016-03-09 ENCOUNTER — Encounter: Payer: Self-pay | Admitting: Radiation Oncology

## 2016-03-09 ENCOUNTER — Ambulatory Visit
Admission: RE | Admit: 2016-03-09 | Discharge: 2016-03-09 | Disposition: A | Discharge status: Home / Self Care | Payer: Self-pay | Source: Ambulatory Visit | Attending: Radiation Oncology | Admitting: Radiation Oncology

## 2016-03-09 ENCOUNTER — Encounter: Payer: Self-pay | Admitting: *Deleted

## 2016-03-09 ENCOUNTER — Ambulatory Visit: Payer: Self-pay | Attending: Radiation Oncology

## 2016-03-09 ENCOUNTER — Ambulatory Visit: Payer: Self-pay | Admitting: Nutrition

## 2016-03-09 DIAGNOSIS — R29898 Other symptoms and signs involving the musculoskeletal system: Secondary | ICD-10-CM | POA: Insufficient documentation

## 2016-03-09 DIAGNOSIS — R293 Abnormal posture: Secondary | ICD-10-CM

## 2016-03-09 DIAGNOSIS — R1312 Dysphagia, oropharyngeal phase: Secondary | ICD-10-CM | POA: Insufficient documentation

## 2016-03-09 NOTE — Therapy (Signed)
[place], [place], [address] Phone: [phone]   Fax:  [phone]  Physical Therapy Evaluation  Patient Details  Name: Heather Strickland MRN: [id number] Date of Birth: [birth year] Referring Provider: Dr. Eppie Gibson  Encounter Date: 03/09/2016      PT End of Session - 03/09/16 1117    Visit Number 1   Number of Visits 1   PT Start Time 1015   PT Stop Time 1050   PT Time Calculation (min) 35 min   Activity Tolerance Patient tolerated treatment well   Behavior During Therapy [facility] for tasks assessed/performed      Past Medical History:  Diagnosis Date  . Anemia   . GERD (gastroesophageal reflux disease)   . Squamous cell carcinoma of left tonsil ([place]) 01/27/2016    Past Surgical History:  Procedure Laterality Date  . CESAREAN SECTION  [id number]  . ESOPHAGOGASTRODUODENOSCOPY N/A 11/07/2015   Dr. Gala Romney: normal esophagus, chronic gastritis   . IR GENERIC HISTORICAL  02/11/2016   IR FLUORO GUIDE PORT INSERTION RIGHT 02/11/2016 Sandi Mariscal, MD [facility]  . IR GENERIC HISTORICAL  02/11/2016   IR US GUIDE VASC ACCESS RIGHT 02/11/2016 Sandi Mariscal, MD [facility]  . IR GENERIC HISTORICAL  02/11/2016   IR GASTROSTOMY TUBE MOD SED 02/11/2016 Sandi Mariscal, MD [facility]  . MULTIPLE EXTRACTIONS WITH ALVEOLOPLASTY N/A 02/20/2016   Procedure: Extraction of tooth #'s 15,17,18 and 32 with alveoloplasty and dental cleaning of teeth;  Surgeon: Lenn Cal, DDS;  Location: [facility];  Service: Oral Surgery;  Laterality: N/A;    There were no vitals filed for this visit.       Subjective Assessment - 03/09/16 1059    Subjective Having some discomfort from PEG tube, which was placed 02/11/16   Patient is accompained by: Interpreter  friend in addition to an interpreter   Pertinent History Diagnosis is left tonsil squamous cell carcinoma, HPV positive, biopsied 01/15/16.  Started XRT 03/08/16.  Has PEG and  Portacath.     Patient Stated Goals get info from all head & neck clinic providers   Currently in Pain? Yes   Pain Score 4    Pain Location Throat   Pain Descriptors / Indicators Pressure   Aggravating Factors  swallowing   Pain Relieving Factors soft foods are better            [facility] PT Assessment - 03/09/16 0001      Assessment   Medical Diagnosis left tonsil squamous cell CA   Referring Provider Dr. Eppie Gibson   Onset Date/Surgical Date 01/15/16  biopsy     Precautions   Precautions Other (comment)   Precaution Comments cancer precautions     Restrictions   Weight Bearing Restrictions No     Balance Screen   Has the patient fallen in the past 6 months No   Has the patient had a decrease in activity level because of a fear of falling?  No   Is the patient reluctant to leave their home because of a fear of falling?  No     Home Environment   Living Environment Private residence   Living Arrangements Spouse/significant other;Children  2 kids   Type of Home Mobile home   [place] to enter   [place] One level     Prior Function   Level of Independence Independent   Leisure no regular exercise currently     Cognition   Overall  Cognitive Status Within Functional Limits for tasks assessed     Functional Tests   Functional tests Sit to Stand     Sit to Stand   Comments 9 times in 30 seconds, below average for age; she seemed slowed by discomfort from the PEG tube     Posture/Postural Control   Posture/Postural Control Postural limitations   Postural Limitations Forward head     ROM / Strength   AROM / PROM / Strength AROM     AROM   Overall AROM  Within functional limits for tasks performed   Overall AROM Comments neck and bilateral shoulder AROM WNL     Ambulation/Gait   Ambulation/Gait Yes   Ambulation/Gait Assistance 7: Independent           LYMPHEDEMA/ONCOLOGY QUESTIONNAIRE - 03/09/16 1114      Type   Cancer Type left tonsil  squamous cell carcinoma     Lymphedema Assessments   Lymphedema Assessments Head and Neck     Head and Neck   4 cm superior to sternal notch around neck 35.8 cm   6 cm superior to sternal notch around neck 35.1 cm   8 cm superior to sternal notch around neck 36.5 cm                        PT Education - 03/09/16 1116    Education provided Yes   Education Details posture, neck ROM, walking, lymphedema info, PT info, Cure article on staying active   Person(s) Educated Patient;Other (comment)  friend   Methods Explanation;Handout   Comprehension Verbalized understanding                 Head and Neck Clinic Goals - 03/09/16 1121      Patient will be able to verbalize understanding of a home exercise program for cervical range of motion, posture, and walking.    Status Achieved     Patient will be able to verbalize understanding of proper sitting and standing posture.    Status Achieved     Patient will be able to verbalize understanding of lymphedema risk and availability of treatment for this condition.    Status Achieved           Plan - 03/09/16 1117    Clinical Impression Statement This is a very pleasant young woman with a diagnosis of left tonsil squamous cell carcinoma, who started XRT on 03/08/16.  She hs good neck and shoulder AROM, but forward head posture; some throat pain.  She does not do regular exercise.  Eval is low complexity. She is at significant risk for developing lymphedema. She had low performance on 30 second repeated sit to stand, but this seemed to be limited by discomfort from her PEG tube.   Rehab Potential Good   PT Frequency One time visit   PT Treatment/Interventions Patient/family education   PT Next Visit Plan None at this time; may need therapy post-treatment, should lymphedema develop.   PT Home Exercise Plan neck ROM, walking, breathing exercise   Consulted and Agree with Plan of Care Patient       Patient will benefit from skilled therapeutic intervention in order to improve the following deficits and impairments:  Postural dysfunction  Visit Diagnosis: Abnormal posture - Plan: PT plan of care cert/re-cert  Other symptoms and signs involving the musculoskeletal system - Plan: PT plan of care cert/re-cert     Problem List Patient Active Problem List  Diagnosis Date Noted  . Squamous cell carcinoma of left tonsil ([place]) 01/27/2016  . GERD (gastroesophageal reflux disease) 12/22/2015  . H. pylori infection 07/21/2015  . Esophageal reflux 06/19/2015  . Prediabetes 06/19/2015  . Obesity, unspecified 06/19/2015  . Absolute anemia 06/19/2015    Heather Strickland 03/09/2016, 11:24 AM  [facility] [place], [place], [address] Phone: [phone]   Fax:  [phone]  Name: Heather Strickland MRN: [id number] Date of Birth: 1975/07/20  Serafina Royals, PT 03/09/16 11:24 AM

## 2016-03-09 NOTE — Therapy (Signed)
[facility] [address] [place], [place], [address] Phone: [phone]   Fax:  [phone]  Speech Language Pathology Evaluation  Patient Details  Name: Heather Strickland MRN: [id number] Date of Birth: [birth year] Referring Provider: Eppie Gibson, MD  Encounter Date: 03/09/2016      End of Session - 03/09/16 1158    Visit Number 1   Number of Visits 6   Date for SLP Re-Evaluation 08/13/16   SLP Start Time 21   SLP Stop Time  1100   SLP Time Calculation (min) 40 min   Activity Tolerance Patient tolerated treatment well      Past Medical History:  Diagnosis Date  . Anemia   . GERD (gastroesophageal reflux disease)   . Squamous cell carcinoma of left tonsil ([place]) 01/27/2016    Past Surgical History:  Procedure Laterality Date  . CESAREAN SECTION  [id number]  . ESOPHAGOGASTRODUODENOSCOPY N/A 11/07/2015   Dr. Gala Romney: normal esophagus, chronic gastritis   . IR GENERIC HISTORICAL  02/11/2016   IR FLUORO GUIDE PORT INSERTION RIGHT 02/11/2016 Sandi Mariscal, MD [facility]  . IR GENERIC HISTORICAL  02/11/2016   IR US GUIDE VASC ACCESS RIGHT 02/11/2016 Sandi Mariscal, MD [facility]  . IR GENERIC HISTORICAL  02/11/2016   IR GASTROSTOMY TUBE MOD SED 02/11/2016 Sandi Mariscal, MD [facility]  . MULTIPLE EXTRACTIONS WITH ALVEOLOPLASTY N/A 02/20/2016   Procedure: Extraction of tooth #'s 15,17,18 and 32 with alveoloplasty and dental cleaning of teeth;  Surgeon: Lenn Cal, DDS;  Location: [facility];  Service: Oral Surgery;  Laterality: N/A;    There were no vitals filed for this visit.      Subjective Assessment - 03/09/16 1147    Subjective Interpreter and pt's friend accompany pt in room today.   Patient is accompained by: Interpreter   Currently in Pain? Yes   Pain Score 4    Pain Location Throat   Pain Orientation Mid   Pain Descriptors / Indicators Pressure   Pain Frequency Intermittent   Aggravating Factors   swallow   Pain Relieving Factors Dys I, II, III foods            SLP Evaluation [facility] - 03/09/16 1147      SLP Visit Information   SLP Received On 03/09/16   Referring Provider Eppie Gibson, MD   Medical Diagnosis Lt tonsilar CA     General Information   HPI Pt is 41 y.o female with SCC of lt tonsil who is HPV+. Radiation began yesterday, PEG placed 02-11-16.      Prior Functional Status   Cognitive/Linguistic Baseline Within functional limits   Type of Home Mobile home    Lives With Spouse     Cognition   Overall Cognitive Status Within Functional Limits for tasks assessed     Auditory Comprehension   Overall Auditory Comprehension Appears within functional limits for tasks assessed     Verbal Expression   Overall Verbal Expression Appears within functional limits for tasks assessed     Oral Motor/Sensory Function   Overall Oral Motor/Sensory Function Appears within functional limits for tasks assessed     Motor Speech   Overall Motor Speech Appears within functional limits for tasks assessed     Pt currently tolerates regular diet with thin liquids and does not report any coughing or choking with meals.  POs: Pt ate [place] sandwich and drank H2O without overt s/s aspiration. Thyroid elevation appeared adequate, and swallows appeared timely. Pt's  swallow deemed WNL/WFL at this time.   Because data states the risk for dysphagia during and after radiation treatment is high due to undergoing radiation tx, SLP taught pt about the possibility of reduced/limited ability for PO intake during rad tx. SLP encouraged pt to continue swallowing POs as far into rad tx as possible, even ingesting POs and/or completing HEP shortly after administration of pain meds.   SLP educated pt (via interpreter) re: changes to swallowing musculature after rad tx, and why adherence to dysphagia HEP provided today and PO consumption was necessary to inhibit muscular disuse atrophy and to reduce  muscle fibrosis following rad tx. Pt demonstrated understanding of these things to SLP.    SLP then developed a HEP for pt and pt was instructed how to perform exercises involving lingual, vocal, and pharyngeal strengthening. SLP performed each exercise and pt return demonstrated each exercise. SLP ensured pt performance was correct prior to moving on to next exercise. Pt was instructed to complete this program 2-3 times a day until 6 months after her last rad tx, then x2-3 a week after that.                     SLP Education - 03/09/16 1157    Education provided Yes   Education Details HEP, late effects head/neck radiation   Person(s) Educated Patient;Other (comment)  friend   Methods Explanation;Demonstration;Verbal cues;Handout   Comprehension Verbalized understanding;Returned demonstration;Verbal cues required;Need further instruction          SLP Short Term Goals - 03/09/16 1217      SLP SHORT TERM GOAL #1   Title pt will perform HEP with rare min A   Time 2   Period --  visits   Status New     SLP SHORT TERM GOAL #2   Title pt will complete HEP with modified independence   Time 3   Period --  visits   Status New     SLP SHORT TERM GOAL #3   Title pt will tell SLP why she is completing HEP   Time 3   Period --  visits   Status New     SLP SHORT TERM GOAL #4   Title pt will tell SLP 3 overt s/s aspiration PNA    Time 3   Period --  visits   Status New          SLP Long Term Goals - 03/09/16 1220      SLP LONG TERM GOAL #1   Title pt will demo HEP with modified independence over 3 sessions   Time 6   Period --  visits   Status New     SLP LONG TERM GOAL #2   Title pt will tell SLP 3 overt s/s aspiration PNA over 2 sessions with modified independence   Time 4   Period --  visits   Status New     SLP LONG TERM GOAL #3   Title pt will tell how food journal can A pt in returning back to WNL texture POs   Time 4   Period --  visits    Status New          Plan - 03/09/16 1159    Clinical Impression Statement Pt was seen today in multi-d clinic for head/neck cancer, adn presents currently with swallowing WFL/WNL. SLP provided education to pt today (See "education") and introduced pt to HEP to do during and after radiation tx  for head/neck CA. Skilled ST remains necessary to cont to assess pt accuracy with HEP as well as assess safety of pt's POs.    Speech Therapy Frequency --  once approx every four weeks   Duration --  6 visits   Treatment/Interventions Aspiration precaution training;Pharyngeal strengthening exercises;Diet toleration management by SLP;Trials of upgraded texture/liquids;Cueing hierarchy;Internal/external aids;SLP instruction and feedback;Patient/family education;Compensatory strategies  any or all may be used   Potential to Achieve Goals Good   SLP Home Exercise Plan provided today   Consulted and Agree with Plan of Care Patient      Patient will benefit from skilled therapeutic intervention in order to improve the following deficits and impairments:   Dysphagia, oropharyngeal phase    Problem List Patient Active Problem List   Diagnosis Date Noted  . Squamous cell carcinoma of left tonsil ([place]) 01/27/2016  . GERD (gastroesophageal reflux disease) 12/22/2015  . H. pylori infection 07/21/2015  . Esophageal reflux 06/19/2015  . Prediabetes 06/19/2015  . Obesity, unspecified 06/19/2015  . Absolute anemia 06/19/2015    [facility] 03/09/2016, 12:23 PM  [facility] [address] [place], [place], [address] Phone: [phone]   Fax:  [phone]  Name: Heather Strickland MRN: [id number] Date of Birth: 1975-07-20

## 2016-03-09 NOTE — Progress Notes (Signed)
Patient was seen in Head and Neck Clinic with Interpreter.  40 year old female diagnosed with Left Tonsil Cancer, HPV+, Biopsy 01-15-16.  Medications include Ferrous Sulfate, MVI, Protonix.  Labs were reviewed.  Height: 65 inches. Weight: 156.4 pounds. UBW: 168 pounds October 2017. BMI: 26.03  Patient has started radiation therapy. She is s/p dental extractions 02/20/16. PEG placed 02/11/16. Patient has had a 7% weight loss in 4 months. She is tolerating a soft diet and likes Ensure. Denies N, V, D, C.  Estimated Nutrition needs: 2132-2485 kcal, 92-107 grams protein, 2.5 L fluid.  Nutrition Diagnosis: Unintended weight loss related to inadequate oral intake as evidenced by 7% weight loss from usual body weight over 4 months.  Intervention: Educated patient to consume small frequent meals and snacks with high calorie, high protein foods. Recommended begin oral nutrition supplements once daily. Provided a complimentary case of Ensure plus. Educated patient on altering diet textures as needed when mouth gets sore. Questions answered. Teach back method used.  Monitoring, Evaluation, Goals: Patient will tolerate oral intake and TF to maintain lean body mass and tolerate treatment without delays.  Next Visit: To be scheduled weekly with radiation therapy.

## 2016-03-09 NOTE — Progress Notes (Signed)
Financial Counselor--Spoke with patient today--she has applied for medicaid at [employer], application is still pending--also talked with her about 400.00 [place] grant--she will bring in her husbands income next week to see if she qualifies

## 2016-03-09 NOTE — Patient Instructions (Signed)
The exercises were translated verbally into Spanish by Spanish interpreter today   [place] Do these until 6 months after your last day of radiation, then 2 times per week afterwards  1. Effortful Swallows - Press your tongue against the roof of your mouth for 3 seconds, then squeeze the muscles in your neck while you swallow your saliva or a sip of water - Repeat 20 times, 2-3 times a day, and use whenever you eat or drink  2. Masako Swallow - swallow with your tongue sticking out - Stick tongue out past your teeth and gently bite tongue with your teeth - Swallow, while holding your tongue with your teeth - Repeat 20 times, 2-3 times a day *use a wet spoon if your mouth gets dry*  3. Shaker Exercise - head lift - Lie flat on your back in your bed or on a couch without pillows - Raise your head and look at your feet - KEEP YOUR SHOULDERS DOWN - HOLD FOR 45-60 SECONDS, then lower your head back down - Repeat 3 times, 2-3 times a day  4. Mendelsohn Maneuver - "half swallow" exercise - Start to swallow, and keep your Adam's apple up by squeezing hard with the muscles of the throat - Hold the squeeze for 5-7 seconds and then relax - Repeat 20 times, 2-3 times a day *use a wet spoon if your mouth gets dry*  5. Breath Hold - Say "HUH!" loudly, then hold your breath for 3 seconds at your voice box - Repeat 20 times, 2-3 times a day  6. Chin pushback - Open your mouth  - Place your fist UNDER your chin near your neck, and push back with your fist for 5 seconds - Repeat 10 times, 2-3 times a day

## 2016-03-10 ENCOUNTER — Telehealth: Payer: Self-pay | Admitting: Hematology and Oncology

## 2016-03-10 ENCOUNTER — Ambulatory Visit
Admission: RE | Admit: 2016-03-10 | Discharge: 2016-03-10 | Disposition: A | Discharge status: Home / Self Care | Payer: Self-pay | Source: Ambulatory Visit | Attending: Radiation Oncology | Admitting: Radiation Oncology

## 2016-03-10 ENCOUNTER — Ambulatory Visit (HOSPITAL_BASED_OUTPATIENT_CLINIC_OR_DEPARTMENT_OTHER): Payer: Self-pay | Admitting: Hematology and Oncology

## 2016-03-10 DIAGNOSIS — C099 Malignant neoplasm of tonsil, unspecified: Secondary | ICD-10-CM

## 2016-03-10 DIAGNOSIS — R042 Hemoptysis: Secondary | ICD-10-CM

## 2016-03-10 NOTE — Telephone Encounter (Signed)
Appointments scheduled per 03/10/16 los. Patient was given a copy of the AVS report and appointment schedule per 03/10/16 los. °

## 2016-03-11 ENCOUNTER — Encounter: Payer: Self-pay | Admitting: Nutrition

## 2016-03-11 ENCOUNTER — Encounter: Payer: Self-pay | Admitting: Hematology and Oncology

## 2016-03-11 ENCOUNTER — Ambulatory Visit
Admission: RE | Admit: 2016-03-11 | Discharge: 2016-03-11 | Disposition: A | Discharge status: Home / Self Care | Payer: Self-pay | Source: Ambulatory Visit | Attending: Radiation Oncology | Admitting: Radiation Oncology

## 2016-03-11 DIAGNOSIS — R042 Hemoptysis: Secondary | ICD-10-CM | POA: Insufficient documentation

## 2016-03-11 NOTE — Progress Notes (Signed)
Oncology Nurse Navigator Documentation  Met with Heather Strickland during [facility] [place].  She was accompanied by a family member, interpreter Lavon Paganini.  Provided verbal and written overview of [facility], the clinicians who will be seeing her, encouraged her to ask questions during her time with them.  She was seen by Nutrition, SLP, PT, SW and [place].   She proceeded to [facility] following [facility]. She understands I can be contacted with needs/concerns.  Gayleen Orem, RN, BSN, [place] at [place] [phone]

## 2016-03-11 NOTE — Assessment & Plan Note (Signed)
She has minimum occasional hemoptysis when she tried to cough. I think this is precipitated by dry mouth and possibly related to mucosal sloughing from treatment. I reassured them I saw as it is minimum in nature, not to worry too much for now.

## 2016-03-11 NOTE — Progress Notes (Signed)
[facility] progress notes  Patient Care Team: Soyla Dryer, PA-C as PCP - General (Physician Assistant) No Pcp Per Patient (General Practice) Daneil Dolin, MD as Consulting Physician (Gastroenterology) Annitta Needs, NP (Gastroenterology) Leta Baptist, MD as Consulting Physician (Otolaryngology) Leota Sauers, RN as Oncology Nurse Navigator Eppie Gibson, MD as Attending Physician (Radiation Oncology)  CHIEF COMPLAINTS/PURPOSE OF VISIT:  Left tonsil cancer  HISTORY OF PRESENTING ILLNESS:  Heather Strickland 41 y.o. female was transferred to my care after her prior physician has left.  I reviewed the patient's records extensive and collaborated the history with the patient. 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 Benjamine Mola      01/15/2016 Initial Diagnosis    She presented to Dr. Benjamine Mola after being referred by [name], NP (GI) with a 5-6 month history of severe sore throat.  The patient never sought treatment for this issue previously.  Over the last 3 months, patient reported intermittent bleeding from left tonsil.  She also feels a hard mass along the left side of neck.  On physical exam by Dr. Benjamine Mola, he noted 3+ ulceration and erythema on the left tonsil.  On flexible laryngoscopy, tonsillar hypertrophy with ulceration was noted on the left.      01/19/2016 Pathology Results    Invasive squamous cell carcinoma, poorly differentiated.      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 Fine soft tissue details lost at these levels.  Pharyngeal mass centered in the left palatini tonsil with surround mucosal thickening as described below probably representing neoplasm.  Mucosal thickening of the left aspect of hypopharynx  and pharynx with inferior most extension to the margin of the supraglottic airway without appreciable invasion into epiglottis, aryepiglottic folds, vocal cords, or paraglottic fat.  Superior most extension of mucosal thickening to the left aspect of the uvula and along the left aspect of the soft palate to the hard/soft palate junction, this region is obscured by motion artifact.  Anteriorly there is effacement of left glossopharyngeal sulcus without appreciable tongue base invasion, this region is obscured by motion artifact.  Asymmetric enlargement of left-sided level 2 and 3 cervical lymph nodes possibly metastatic. No evidence for lymph node necrosis or extra nodal extension.      01/29/2016 Imaging    CT chest-  No evidence of metastatic disease in the chest.      02/06/2016 PET scan    1. Highly hypermetabolic left palatine tonsillar mass, maximum SUV 38.5. 2. A left station IIa lymph node measure 1.1 cm in short axis, mildly enlarged on image 38/3, but has only a borderline elevated SUV at 3.5. 3. Other imaging findings of potential clinical significance: Mild cardiomegaly. Calcified granuloma in the superior segment right lower lobe (benign). IUD satisfactorily positioned in the uterus.      02/11/2016 Procedure    Successful fluoroscopic insertion of a 20-French pull-through gastrostomy tube.      02/11/2016 Procedure    Successful placement of a right internal jugular approach power injectable Port-A-Cath. The catheter is ready for immediate use.      03/08/2016 -  Radiation Therapy    She received radiation treatment only  She is accompanied by her daughter in law who is interpreting today. She tolerated radiation well. She has some mild dry mouth. She has minimum pain. She denies dysphagia. She have occasional hemoptysis in the morning when she tries to clear her throat. Her appetite is stable.  She denies recent weight loss.  MEDICAL HISTORY:   Past Medical History:  Diagnosis Date  . Anemia   . GERD (gastroesophageal reflux disease)   . 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 CESAREAN SECTION  [id number]  . ESOPHAGOGASTRODUODENOSCOPY N/A 11/07/2015   Dr. Gala Romney: normal esophagus, chronic gastritis   . IR GENERIC HISTORICAL  02/11/2016   IR FLUORO GUIDE PORT INSERTION RIGHT 02/11/2016 Sandi Mariscal, MD [facility]  . IR GENERIC HISTORICAL  02/11/2016   IR US GUIDE VASC ACCESS RIGHT 02/11/2016 Sandi Mariscal, MD [facility]  . IR GENERIC HISTORICAL  02/11/2016   IR GASTROSTOMY TUBE MOD SED 02/11/2016 Sandi Mariscal, MD [facility]  . MULTIPLE EXTRACTIONS WITH ALVEOLOPLASTY N/A 02/20/2016   Procedure: Extraction of tooth #'s 15,17,18 and 32 with alveoloplasty and dental cleaning of teeth;  Surgeon: Lenn Cal, DDS;  Location: [facility];  Service: Oral Surgery;  Laterality: N/A;    SOCIAL HISTORY: Social History   Social History  . Marital status: Married    Spouse name: N/A  . Number of children: 4  . Years of education: N/A   Occupational History  . Not on file.   Social History Main Topics  . Smoking status: Never Smoker  . Smokeless tobacco: Never Used  . Alcohol use No  . Drug use: No  . Sexual activity: Not Currently    Birth control/ protection: None, IUD   Other Topics Concern  . Not on file   Social History Narrative  . No narrative on file    FAMILY HISTORY: Family History  Problem Relation Age of Onset  . Diabetes Mother   . Hypertension Mother   . Prostatitis Father   . ADD / ADHD Daughter   . Cerebral palsy Son     ALLERGIES:  is allergic to no known allergies.  MEDICATIONS:  Current Outpatient Prescriptions  Medication Sig Dispense Refill  . ferrous sulfate 325 (65 FE) MG tablet Take 1 tablet (325 mg total) by mouth daily. 30 tablet 0  . levonorgestrel (MIRENA) 20 MCG/24HR IUD 1 each by Intrauterine  route once.    . Multiple Vitamins-Minerals (MULTIVITAMIN WITH MINERALS) tablet Take 1 tablet by mouth daily.    . pantoprazole (PROTONIX) 40 MG tablet Take 1 tablet (40 mg total) by mouth 2 (two) times daily before a meal. Tome una tableta por boca diaria 60 tablet 3  . sodium fluoride (FLUORISHIELD) 1.1 % GEL dental gel Instill one drop of gel per tooth space of fluoride tray. Place over teeth for 5 minutes. Remove. Spit out excess. Repeat nightly. 120 mL PRN  . oxyCODONE-acetaminophen (PERCOCET) 5-325 MG tablet Take one tablet by mouth every 4 hours as needed for pain. (Patient not taking: Reported on 03/10/2016) 32 tablet 0   No current facility-administered medications for this visit.     REVIEW OF SYSTEMS:   Constitutional: Denies fevers, chills or abnormal night sweats Eyes: Denies blurriness of vision, double vision or watery eyes Ears, nose, mouth, throat, and face: Denies mucositis or sore throat Respiratory: Denies cough, dyspnea or wheezes Cardiovascular: Denies palpitation, chest discomfort or lower extremity swelling  Gastrointestinal:  Denies nausea, heartburn or change in bowel habits Skin: Denies abnormal skin rashes Lymphatics: Denies new lymphadenopathy or easy bruising Neurological:Denies numbness, tingling or new weaknesses Behavioral/Psych: Mood is stable, no new changes  All other systems were reviewed with the patient and are negative.  PHYSICAL EXAMINATION: ECOG PERFORMANCE STATUS: 1 - Symptomatic but completely ambulatory  Vitals:   03/10/16 1505  BP: 125/81  Pulse: 87  Resp: 18  Temp: 97.9 F (36.6 C)   Filed Weights   03/10/16 1505  Weight: 158 lb 14.4 oz (72.1 kg)    GENERAL:alert, no distress and comfortable SKIN: skin color, texture, turgor are normal, no rashes or significant lesions EYES: normal, conjunctiva are pink and non-injected, sclera clear OROPHARYNX:no exudate, normal lips, buccal mucosa, and tongue  NECK: supple, thyroid normal size,  non-tender, without nodularity LYMPH: Mild neck lymphadenopathy. LUNGS: clear to auscultation and percussion with normal breathing effort HEART: regular rate & rhythm and no murmurs without lower extremity edema ABDOMEN:abdomen soft, non-tender and normal bowel sounds.  Feeding tube in situ Musculoskeletal:no cyanosis of digits and no clubbing  PSYCH: alert & oriented x 3 with fluent speech NEURO: no focal motor/sensory deficits  LABORATORY DATA:  I have reviewed the data as listed Lab Results  Component Value Date   WBC 6.1 02/11/2016   HGB 12.5 02/11/2016   HCT 36.6 02/11/2016   MCV 89.7 02/11/2016   PLT 240 02/11/2016    Recent Labs  01/27/16 1739 02/11/16 0744  NA 135 138  K 3.8 3.8  CL 101 104  CO2 28 25  GLUCOSE 101* 98  BUN 14 12  CREATININE 0.65 0.58  CALCIUM 9.4 9.4  GFRNONAA >60 >60  GFRAA >60 >60  PROT 7.9  --   ALBUMIN 4.5  --   AST 17  --   ALT 15  --   ALKPHOS 60  --   BILITOT 0.6  --     RADIOGRAPHIC STUDIES: I have personally reviewed the radiological images as listed and agreed with the findings in the report. Ir Gastrostomy Tube Mod Sed  Result Date: 02/11/2016 INDICATION: History of the vessel carcinoma of the tonsil. Please perform percutaneous gastrostomy tube placement prior to the initiation of chemoradiation. EXAM: PULL TROUGH GASTROSTOMY TUBE PLACEMENT COMPARISON:  PET-CT - 02/06/2016 MEDICATIONS: Ancef 2 gm IV; Antibiotics were administered within 1 hour of the procedure. Glucagon 1 mg IV CONTRAST:  30 mL of Omnipaque 300 administered into the gastric lumen. ANESTHESIA/SEDATION: Moderate (conscious) sedation was employed during this procedure. A total of Versed 3 mg and Fentanyl 50 mcg was administered intravenously. Moderate Sedation Time: 10 minutes. The patient's level of consciousness and vital signs were monitored continuously by radiology nursing throughout the procedure under my direct supervision. FLUOROSCOPY TIME:  1 minute 24 seconds  (16 mGy) COMPLICATIONS: None immediate. PROCEDURE: Informed written consent was obtained from the patient (via the use of a medical translator) following explanation of the procedure, risks, benefits and alternatives. A time out was performed prior to the initiation of the procedure. Ultrasound scanning was performed to demarcate the edge of the left lobe of the liver. Maximal barrier sterile technique utilized including caps, mask, sterile gowns, sterile gloves, large sterile drape, hand hygiene and Betadine prep. The left upper quadrant was sterilely prepped and draped. An oral gastric catheter was inserted into the stomach under fluoroscopy. The existing nasogastric feeding tube was removed. The left costal margin and barium / air opacified transverse colon were identified and avoided. Pharmacist, community  was injected into the stomach for insufflation and visualization under fluoroscopy. Under sterile conditions a 17 gauge trocar needle was utilized to access the stomach percutaneously beneath the left subcostal margin after the overlying soft tissues were anesthetized with 1% Lidocaine with epinephrine. Needle position was confirmed within the stomach with aspiration of air and injection of small amount of contrast. A single T tack was deployed for gastropexy. Over an Amplatz guide wire, a 9-French sheath was inserted into the stomach. A snare device was utilized to capture the oral gastric catheter. The snare device was pulled retrograde from the stomach up the esophagus and out the oropharynx. The 20-French pull-through gastrostomy was connected to the snare device and pulled antegrade through the oropharynx down the esophagus into the stomach and then through the percutaneous tract external to the patient. The gastrostomy was assembled externally. Contrast injection confirms position in the stomach. Several spot radiographic images were obtained in various obliquities for documentation. The patient tolerated procedure well  without immediate post procedural complication. FINDINGS: After successful fluoroscopic guided placement, the gastrostomy tube is appropriately positioned with internal disc against the ventral aspect of the gastric lumen. IMPRESSION: Successful fluoroscopic insertion of a 20-French pull-through gastrostomy tube. The gastrostomy may be used immediately for medication administration and in 24 hrs for the initiation of feeds. Electronically Signed   By: Sandi Mariscal M.D.   On: 02/11/2016 13:59   Ir US Guide Vasc Access Right  Result Date: 02/11/2016 INDICATION: History of squamous cell carcinoma of the left tonsil. In need of durable intravenous access for chemotherapy administration. EXAM: IMPLANTED PORT A CATH PLACEMENT WITH ULTRASOUND AND FLUOROSCOPIC GUIDANCE COMPARISON:  PET-CT - 02/06/2016 MEDICATIONS: Ancef 2 gm IV; The antibiotic was administered within an appropriate time interval prior to skin puncture. ANESTHESIA/SEDATION: Moderate (conscious) sedation was employed during this procedure. A total of Versed 3 mg and Fentanyl 50 mcg was administered intravenously. Moderate Sedation Time: 26 minutes. The patient's level of consciousness and vital signs were monitored continuously by radiology nursing throughout the procedure under my direct supervision. CONTRAST:  None FLUOROSCOPY TIME:  12 seconds (4 mGy) COMPLICATIONS: None immediate. PROCEDURE: The procedure, risks, benefits, and alternatives were explained to the patient. Questions regarding the procedure were encouraged and answered. The patient understands and consents to the procedure. The right neck and chest were prepped with chlorhexidine in a sterile fashion, and a sterile drape was applied covering the operative field. Maximum barrier sterile technique with sterile gowns and gloves were used for the procedure. A timeout was performed prior to the initiation of the procedure. Local anesthesia was provided with 1% lidocaine with epinephrine. After  creating a small venotomy incision, a micropuncture kit was utilized to access the internal jugular vein under direct, real-time ultrasound guidance. Ultrasound image documentation was performed. The microwire was kinked to measure appropriate catheter length. A subcutaneous port pocket was then created along the upper chest wall utilizing a combination of sharp and blunt dissection. The pocket was irrigated with sterile saline. A single lumen ISP power injectable port was chosen for placement. The 8 Fr catheter was tunneled from the port pocket site to the venotomy incision. The port was placed in the pocket. The external catheter was trimmed to appropriate length. At the venotomy, an 8 Fr peel-away sheath was placed over a guidewire under fluoroscopic guidance. The catheter was then placed through the sheath and the sheath was removed. Final catheter positioning was confirmed and documented with a fluoroscopic spot radiograph. The port was  accessed with a Huber needle, aspirated and flushed with heparinized saline. The venotomy site was closed with an interrupted 4-0 Vicryl suture. The port pocket incision was closed with interrupted 2-0 Vicryl suture and the skin was opposed with a running subcuticular 4-0 Vicryl suture. Dermabond and Steri-strips were applied to both incisions. Dressings were placed. The patient tolerated the procedure well without immediate post procedural complication. FINDINGS: After catheter placement, the tip lies within the superior cavoatrial junction. The catheter aspirates and flushes normally and is ready for immediate use. IMPRESSION: Successful placement of a right internal jugular approach power injectable Port-A-Cath. The catheter is ready for immediate use. Electronically Signed   By: Sandi Mariscal M.D.   On: 02/11/2016 12:27   Ir Fluoro Guide Port Insertion Right  Result Date: 02/11/2016 INDICATION: History of squamous cell carcinoma of the left tonsil. In need of durable  intravenous access for chemotherapy administration. EXAM: IMPLANTED PORT A CATH PLACEMENT WITH ULTRASOUND AND FLUOROSCOPIC GUIDANCE COMPARISON:  PET-CT - 02/06/2016 MEDICATIONS: Ancef 2 gm IV; The antibiotic was administered within an appropriate time interval prior to skin puncture. ANESTHESIA/SEDATION: Moderate (conscious) sedation was employed during this procedure. A total of Versed 3 mg and Fentanyl 50 mcg was administered intravenously. Moderate Sedation Time: 26 minutes. The patient's level of consciousness and vital signs were monitored continuously by radiology nursing throughout the procedure under my direct supervision. CONTRAST:  None FLUOROSCOPY TIME:  12 seconds (4 mGy) COMPLICATIONS: None immediate. PROCEDURE: The procedure, risks, benefits, and alternatives were explained to the patient. Questions regarding the procedure were encouraged and answered. The patient understands and consents to the procedure. The right neck and chest were prepped with chlorhexidine in a sterile fashion, and a sterile drape was applied covering the operative field. Maximum barrier sterile technique with sterile gowns and gloves were used for the procedure. A timeout was performed prior to the initiation of the procedure. Local anesthesia was provided with 1% lidocaine with epinephrine. After creating a small venotomy incision, a micropuncture kit was utilized to access the internal jugular vein under direct, real-time ultrasound guidance. Ultrasound image documentation was performed. The microwire was kinked to measure appropriate catheter length. A subcutaneous port pocket was then created along the upper chest wall utilizing a combination of sharp and blunt dissection. The pocket was irrigated with sterile saline. A single lumen ISP power injectable port was chosen for placement. The 8 Fr catheter was tunneled from the port pocket site to the venotomy incision. The port was placed in the pocket. The external catheter was  trimmed to appropriate length. At the venotomy, an 8 Fr peel-away sheath was placed over a guidewire under fluoroscopic guidance. The catheter was then placed through the sheath and the sheath was removed. Final catheter positioning was confirmed and documented with a fluoroscopic spot radiograph. The port was accessed with a Huber needle, aspirated and flushed with heparinized saline. The venotomy site was closed with an interrupted 4-0 Vicryl suture. The port pocket incision was closed with interrupted 2-0 Vicryl suture and the skin was opposed with a running subcuticular 4-0 Vicryl suture. Dermabond and Steri-strips were applied to both incisions. Dressings were placed. The patient tolerated the procedure well without immediate post procedural complication. FINDINGS: After catheter placement, the tip lies within the superior cavoatrial junction. The catheter aspirates and flushes normally and is ready for immediate use. IMPRESSION: Successful placement of a right internal jugular approach power injectable Port-A-Cath. The catheter is ready for immediate use. Electronically Signed   By:  Sandi Mariscal M.D.   On: 02/11/2016 12:27    ASSESSMENT & PLAN:  Squamous cell carcinoma of left tonsil ([facility]) So far, she appears to be tolerating treatment well. I explained to them  by role as a medical oncologist. Although the patient is not currently receiving chemotherapy, we expect potential side effects during treatment. I will offer her continue with supportive care.  I plan to see her back again in 2-1/2 weeks for further review.  Hemoptysis She has minimum occasional hemoptysis when she tried to cough. I think this is precipitated by dry mouth and possibly related to mucosal sloughing from treatment. I reassured them I saw as it is minimum in nature, not to worry too much for now.   No orders of the defined types were placed in this encounter.   All questions were answered. The patient knows to call the  clinic with any problems, questions or concerns. I spent 30 minutes counseling the patient face to face. The total time spent in the appointment was 40 minutes and more than 50% was on counseling.     [name] Lark, MD 03/11/2016 2:50 PM

## 2016-03-11 NOTE — Progress Notes (Signed)
Oncology Nurse Navigator Documentation  To provide support, encouragement and care continuity, met with Ms. Pytel for her initial Tom[facility] RT.  She was accompanied by her d-in-law Joellen [place]. She completed tmt with difficulty, denied concerns. I escorted them to [place] with Dr. Isidore Moos.  Gayleen Orem, RN, BSN, [facility] Nurse [place] [facility] [place] [phone]

## 2016-03-11 NOTE — Assessment & Plan Note (Signed)
So far, she appears to be tolerating treatment well. I explained to them  by role as a medical oncologist. Although the patient is not currently receiving chemotherapy, we expect potential side effects during treatment. I will offer her continue with supportive care.  I plan to see her back again in 2-1/2 weeks for further review.

## 2016-03-12 ENCOUNTER — Ambulatory Visit
Admission: RE | Admit: 2016-03-12 | Discharge: 2016-03-12 | Disposition: A | Discharge status: Home / Self Care | Payer: Self-pay | Source: Ambulatory Visit | Attending: Radiation Oncology | Admitting: Radiation Oncology

## 2016-03-15 ENCOUNTER — Encounter: Payer: Self-pay | Admitting: Radiation Oncology

## 2016-03-15 ENCOUNTER — Ambulatory Visit
Admission: RE | Admit: 2016-03-15 | Discharge: 2016-03-15 | Disposition: A | Discharge status: Home / Self Care | Payer: Self-pay | Source: Ambulatory Visit | Attending: Radiation Oncology | Admitting: Radiation Oncology

## 2016-03-15 VITALS — BP 116/78 | Temp 98.5°F | Ht 65.0 in | Wt 159.4 lb

## 2016-03-15 DIAGNOSIS — C090 Malignant neoplasm of tonsillar fossa: Secondary | ICD-10-CM

## 2016-03-15 MED ORDER — LIDOCAINE VISCOUS 2 % MT SOLN
OROMUCOSAL | 5 refills | Status: DC
Start: 2016-03-15 — End: 2016-08-17

## 2016-03-15 MED FILL — LIDOCAINE 2% VISCOUS SOLN: 2 | 5 days supply | Qty: 100 | Fill #0

## 2016-03-15 NOTE — Progress Notes (Signed)
   Weekly Management Note:  Outpatient    ICD-9-CM ICD-10-CM   1. Cancer of tonsillar fossa (HCC) 146.1 C09.0 lidocaine (XYLOCAINE) 2 % solution    Current Dose:  12 Gy  Projected Dose: 70 Gy   Narrative:  The patient presents for routine under treatment assessment.  CBCT/MVCT images/Port film x-rays were reviewed.  The chart was checked. Doing well. More soreness in throat.  Physical Findings:  Wt Readings from Last 3 Encounters:  03/15/16 159 lb 6.4 oz (72.3 kg)  03/10/16 158 lb 14.4 oz (72.1 kg)  03/08/16 156 lb 6.4 oz (70.9 kg)    height is 5\' 5"  (1.651 m) and weight is 159 lb 6.4 oz (72.3 kg). Her temperature is 98.5 F (36.9 C). Her blood pressure is 116/78. Her oxygen saturation is 99%.  left tonsil tumor stable. Skin intact. No mucositis, no thrush.  CBC    Component Value Date/Time   WBC 6.1 02/11/2016 0744   RBC 4.08 02/11/2016 0744   HGB 12.5 02/11/2016 0744   HCT 36.6 02/11/2016 0744   PLT 240 02/11/2016 0744   MCV 89.7 02/11/2016 0744   MCH 30.6 02/11/2016 0744   MCHC 34.2 02/11/2016 0744   RDW 13.3 02/11/2016 0744   LYMPHSABS 1.7 02/11/2016 0744   MONOABS 0.4 02/11/2016 0744   EOSABS 0.1 02/11/2016 0744   BASOSABS 0.0 02/11/2016 0744     CMP     Component Value Date/Time   NA 138 02/11/2016 0744   K 3.8 02/11/2016 0744   CL 104 02/11/2016 0744   CO2 25 02/11/2016 0744   GLUCOSE 98 02/11/2016 0744   BUN 12 02/11/2016 0744   CREATININE 0.58 02/11/2016 0744   CALCIUM 9.4 02/11/2016 0744   PROT 7.9 01/27/2016 1739   ALBUMIN 4.5 01/27/2016 1739   AST 17 01/27/2016 1739   ALT 15 01/27/2016 1739   ALKPHOS 60 01/27/2016 1739   BILITOT 0.6 01/27/2016 1739   GFRNONAA >60 02/11/2016 0744   GFRAA >60 02/11/2016 0744    Impression:  The patient is tolerating radiotherapy.   Plan:  Continue radiotherapy as planned. Lidocaine PRN sore throat.  Advised to stop antioxidant vitamin pills during RT.  -----------------------------------  Eppie Gibson, MD

## 2016-03-15 NOTE — Progress Notes (Signed)
Ms. Rinehart presents for her 6th fraction of radiation to her Bilateral neck and Left Tonsil. She denies pain but does report a mild sore throat. She has some mild pain when swallowing. She tells me her mouth becomes dry at night. She reports some mild fatigue. She reports that she is eating well. She is drinking about 2 ensures daily. She is drinking 5-6 bottles of water daily. Her family is keeping them numbered to count the exact amount. The skin to her neck is intact, and she is using biafine twice daily as directed.  BP 116/78   Temp 98.5 F (36.9 C)   Ht 5\' 5"  (1.651 m)   Wt 159 lb 6.4 oz (72.3 kg)   SpO2 99% Comment: room air  BMI 26.53 kg/m    Wt Readings from Last 3 Encounters:  03/15/16 159 lb 6.4 oz (72.3 kg)  03/10/16 158 lb 14.4 oz (72.1 kg)  03/08/16 156 lb 6.4 oz (70.9 kg)

## 2016-03-15 NOTE — Addendum Note (Signed)
Encounter addended by: Malena Edman, RN on: 03/15/2016  5:29 PM<BR>    Actions taken: Order Reconciliation Section accessed

## 2016-03-16 ENCOUNTER — Ambulatory Visit
Admission: RE | Admit: 2016-03-16 | Discharge: 2016-03-16 | Disposition: A | Discharge status: Home / Self Care | Payer: Self-pay | Source: Ambulatory Visit | Attending: Radiation Oncology | Admitting: Radiation Oncology

## 2016-03-17 ENCOUNTER — Ambulatory Visit
Admission: RE | Admit: 2016-03-17 | Discharge: 2016-03-17 | Disposition: A | Discharge status: Home / Self Care | Payer: Self-pay | Source: Ambulatory Visit | Attending: Radiation Oncology | Admitting: Radiation Oncology

## 2016-03-18 ENCOUNTER — Ambulatory Visit: Payer: Self-pay | Admitting: Nutrition

## 2016-03-18 ENCOUNTER — Ambulatory Visit
Admission: RE | Admit: 2016-03-18 | Discharge: 2016-03-18 | Disposition: A | Discharge status: Home / Self Care | Payer: Self-pay | Source: Ambulatory Visit | Attending: Radiation Oncology | Admitting: Radiation Oncology

## 2016-03-18 NOTE — Progress Notes (Signed)
Nutrition follow-up completed with patient and her daughter-in-law. 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 Patient reports she is drinking between 1 and 2 oral nutrition supplements daily. She continues to tolerate a soft diet. She denies nutrition impact symptoms.  Nutrition diagnosis: Unintended weight loss improved.  Intervention: Educated patient to continue strategies for adequate calories and protein at meals and oral nutrition supplements one to 2 daily. Provided additional education on eating with sore throat and mouth. Questions were answered.  Teach back method used.  Monitoring, evaluation, goals: Patient will tolerate tube feeding and oral intake to maintain lean body mass throughout treatment.  Next visit: Thursday, March 15 after radiation therapy.  **Disclaimer: This note was dictated with voice recognition software. Similar sounding words can inadvertently be transcribed and this note may contain transcription errors which may not have been corrected upon publication of note.**

## 2016-03-19 ENCOUNTER — Ambulatory Visit
Admission: RE | Admit: 2016-03-19 | Discharge: 2016-03-19 | Disposition: A | Discharge status: Home / Self Care | Payer: Self-pay | Source: Ambulatory Visit | Attending: Radiation Oncology | Admitting: Radiation Oncology

## 2016-03-19 ENCOUNTER — Encounter (HOSPITAL_COMMUNITY): Payer: Self-pay

## 2016-03-22 ENCOUNTER — Encounter: Payer: Self-pay | Admitting: Radiation Oncology

## 2016-03-22 ENCOUNTER — Ambulatory Visit
Admission: RE | Admit: 2016-03-22 | Discharge: 2016-03-22 | Disposition: A | Discharge status: Home / Self Care | Payer: Self-pay | Source: Ambulatory Visit | Attending: Radiation Oncology | Admitting: Radiation Oncology

## 2016-03-22 ENCOUNTER — Encounter (HOSPITAL_COMMUNITY): Payer: Self-pay

## 2016-03-22 ENCOUNTER — Ambulatory Visit: Payer: Self-pay | Admitting: Gastroenterology

## 2016-03-22 VITALS — BP 121/70 | HR 80 | Temp 99.1°F | Wt 157.6 lb

## 2016-03-22 DIAGNOSIS — C090 Malignant neoplasm of tonsillar fossa: Secondary | ICD-10-CM

## 2016-03-22 MED ORDER — HYDROCODONE-ACETAMINOPHEN 7.5-325 MG/15ML PO SOLN: 10.0000 mL | ORAL | 0 refills | Status: DC | PRN

## 2016-03-22 MED ORDER — FLUCONAZOLE 100 MG PO TABS: ORAL_TABLET | ORAL | 0 refills | Status: DC

## 2016-03-22 MED FILL — HYDROCOD-APAP 7.5-325/15ML: 7.5-325 | 5 days supply | Qty: 473 | Fill #0

## 2016-03-22 MED FILL — FLUCONAZOLE 100 MG TABLET: 100 | 7 days supply | Qty: 8 | Fill #0

## 2016-03-22 NOTE — Progress Notes (Addendum)
[name] is here for her 11th fraction of radiation to her Bilateral Neck and Left Tonsil. She reports pain a 6/10 to her throat which becomes worse when swallowing. She plans to pick up her lidocaine prescription today to begin using. She is not eating well because of taste changes and throat pain.  She is drinking 1-2 ensures daily to supplement her intake. She is drinking 4-5 sixteen ounce bottles of water daily. She has not begun to use her feeding tube and has been made aware she should begin using it to maintain nutrition. She is concerned about several red bumps she has to her hairline at her posterior neck. She has also had a feeling of pressure over her bilateral temporal area which has caused her to not feel well. She reports urinary frequency, but denies burning or hematuria. Her tongue has a white coating noted, and darkened areas, and she reports that her tongue has a "raw" feeling.   BP 121/70   Pulse 80   Temp 99.1 F (37.3 C)   Wt 157 lb 9.6 oz (71.5 kg)   SpO2 100% Comment: room air  BMI 26.23 kg/m    Wt Readings from Last 3 Encounters:  03/22/16 157 lb 9.6 oz (71.5 kg)  03/15/16 159 lb 6.4 oz (72.3 kg)  03/10/16 158 lb 14.4 oz (72.1 kg)

## 2016-03-22 NOTE — Progress Notes (Signed)
   Weekly Management Note:  Outpatient    ICD-9-CM ICD-10-CM   1. Cancer of tonsillar fossa (HCC) 146.1 C09.0 fluconazole (DIFLUCAN) 100 MG tablet     HYDROcodone-acetaminophen (HYCET) 7.5-325 mg/15 ml solution    Current Dose:  22 Gy  Projected Dose: 70 Gy   Narrative:  The patient presents for routine under treatment assessment.  CBCT/MVCT images/Port film x-rays were reviewed.  The chart was checked. Doing well. More soreness in throat. Soreness over sides of tongue. Whitish coating on tongue with dark spots.  Has had occasional short lived HAs for several weeks at temples. Drinking a lot which she thinks causes more urination.  Physical Findings:  Wt Readings from Last 3 Encounters:  03/22/16 157 lb 9.6 oz (71.5 kg)  03/15/16 159 lb 6.4 oz (72.3 kg)  03/10/16 158 lb 14.4 oz (72.1 kg)    weight is 157 lb 9.6 oz (71.5 kg). Her temperature is 99.1 F (37.3 C). Her blood pressure is 121/70 and her pulse is 80. Her oxygen saturation is 100%.  left tonsil tumor stable. Skin intact. Whitish tongue with dark spots - mucositis favored, may be an element of thrush   CBC    Component Value Date/Time   WBC 6.1 02/11/2016 0744   RBC 4.08 02/11/2016 0744   HGB 12.5 02/11/2016 0744   HCT 36.6 02/11/2016 0744   PLT 240 02/11/2016 0744   MCV 89.7 02/11/2016 0744   MCH 30.6 02/11/2016 0744   MCHC 34.2 02/11/2016 0744   RDW 13.3 02/11/2016 0744   LYMPHSABS 1.7 02/11/2016 0744   MONOABS 0.4 02/11/2016 0744   EOSABS 0.1 02/11/2016 0744   BASOSABS 0.0 02/11/2016 0744     CMP     Component Value Date/Time   NA 138 02/11/2016 0744   K 3.8 02/11/2016 0744   CL 104 02/11/2016 0744   CO2 25 02/11/2016 0744   GLUCOSE 98 02/11/2016 0744   BUN 12 02/11/2016 0744   CREATININE 0.58 02/11/2016 0744   CALCIUM 9.4 02/11/2016 0744   PROT 7.9 01/27/2016 1739   ALBUMIN 4.5 01/27/2016 1739   AST 17 01/27/2016 1739   ALT 15 01/27/2016 1739   ALKPHOS 60 01/27/2016 1739   BILITOT 0.6 01/27/2016  1739   GFRNONAA >60 02/11/2016 0744   GFRAA >60 02/11/2016 0744    Impression:  The patient is tolerating radiotherapy.   Plan:  Continue radiotherapy as planned. Lidocaine PRN sore throat.  Hycet for pain in future (Rx given today).  Also, fluconazole for possible thrush (Rx given today).  HAs are short lived and not severe in nature.  She will let me know if they become worse or more frequent. -----------------------------------  Eppie Gibson, MD

## 2016-03-23 ENCOUNTER — Ambulatory Visit
Admission: RE | Admit: 2016-03-23 | Discharge: 2016-03-23 | Disposition: A | Discharge status: Home / Self Care | Payer: Self-pay | Source: Ambulatory Visit | Attending: Radiation Oncology | Admitting: Radiation Oncology

## 2016-03-24 ENCOUNTER — Encounter (HOSPITAL_COMMUNITY): Payer: Self-pay

## 2016-03-24 ENCOUNTER — Ambulatory Visit
Admission: RE | Admit: 2016-03-24 | Discharge: 2016-03-24 | Disposition: A | Discharge status: Home / Self Care | Payer: Self-pay | Source: Ambulatory Visit | Attending: Radiation Oncology | Admitting: Radiation Oncology

## 2016-03-25 ENCOUNTER — Other Ambulatory Visit: Payer: Self-pay | Admitting: Hematology and Oncology

## 2016-03-25 ENCOUNTER — Ambulatory Visit
Admission: RE | Admit: 2016-03-25 | Discharge: 2016-03-25 | Disposition: A | Discharge status: Home / Self Care | Payer: Self-pay | Source: Ambulatory Visit | Attending: Radiation Oncology | Admitting: Radiation Oncology

## 2016-03-25 ENCOUNTER — Ambulatory Visit: Payer: Self-pay | Admitting: Nutrition

## 2016-03-25 ENCOUNTER — Ambulatory Visit (HOSPITAL_COMMUNITY): Payer: Self-pay | Admitting: Oncology

## 2016-03-25 ENCOUNTER — Other Ambulatory Visit (HOSPITAL_COMMUNITY): Payer: Self-pay | Admitting: Oncology

## 2016-03-25 DIAGNOSIS — C090 Malignant neoplasm of tonsillar fossa: Secondary | ICD-10-CM

## 2016-03-25 NOTE — Progress Notes (Signed)
Nutrition follow-up completed with patient and her daughter-in-law. Patient receives radiation therapy for tonsil cancer. Weight decreased slightly and documented as 157.6 pounds March 12, down from 159.4 pounds March 5. Patient reports that her throat and mouth are very sore. She has not been using salt water and baking soda rinses. She is drinking two oral nutrition supplements daily.  Nutrition diagnosis: Unintended weight loss continues.  Intervention: Patient educated to continue boost plus but increase to 4 times daily by mouth. If patient unable to tolerate 4 bottles of boost plus a day recommended patient substitute one can Osmolite 1.5 in place of 1 bottle of boost plus and use in feeding tube. Enforced education on how to administer a bolus TF via PEG. Provided Boost Samples. Questions were answered.  Teach back method used.  Monitoring, evaluation, goals:  Patient will tolerate increased oral intake to minimize weight loss.  Next visit: Thursday, March 22.  **Disclaimer: This note was dictated with voice recognition software. Similar sounding words can inadvertently be transcribed and this note may contain transcription errors which may not have been corrected upon publication of note.**

## 2016-03-26 ENCOUNTER — Other Ambulatory Visit (HOSPITAL_BASED_OUTPATIENT_CLINIC_OR_DEPARTMENT_OTHER): Payer: Self-pay

## 2016-03-26 ENCOUNTER — Ambulatory Visit
Admission: RE | Admit: 2016-03-26 | Discharge: 2016-03-26 | Disposition: A | Discharge status: Home / Self Care | Payer: Self-pay | Source: Ambulatory Visit | Attending: Radiation Oncology | Admitting: Radiation Oncology

## 2016-03-26 ENCOUNTER — Encounter (HOSPITAL_COMMUNITY): Payer: Self-pay

## 2016-03-26 DIAGNOSIS — C090 Malignant neoplasm of tonsillar fossa: Secondary | ICD-10-CM

## 2016-03-26 DIAGNOSIS — C099 Malignant neoplasm of tonsil, unspecified: Secondary | ICD-10-CM

## 2016-03-26 LAB — COMPREHENSIVE METABOLIC PANEL
ALT: 11 U/L (ref 0–55)
AST: 13 U/L (ref 5–34)
Albumin: 3.9 g/dL (ref 3.5–5.0)
Alkaline Phosphatase: 68 U/L (ref 40–150)
Anion Gap: 7 mEq/L (ref 3–11)
BUN: 8.3 mg/dL (ref 7.0–26.0)
CHLORIDE: 101 meq/L (ref 98–109)
CO2: 27 meq/L (ref 22–29)
CREATININE: 0.7 mg/dL (ref 0.6–1.1)
Calcium: 9.4 mg/dL (ref 8.4–10.4)
EGFR: >90 mL/min/{1.73_m2} (ref >90)
GLUCOSE: 89 mg/dL (ref 70–140)
Potassium: 4 mEq/L (ref 3.5–5.1)
SODIUM: 136 meq/L (ref 136–145)
Total Bilirubin: 0.71 mg/dL (ref 0.20–1.20)
Total Protein: 7.3 g/dL (ref 6.4–8.3)

## 2016-03-26 LAB — CBC WITH DIFFERENTIAL/PLATELET
BASO%: 0.4 % (ref 0.0–2.0)
BASOS ABS: 0 10*3/uL (ref 0.0–0.1)
EOS%: 2.8 % (ref 0.0–7.0)
Eosinophils Absolute: 0.1 10*3/uL (ref 0.0–0.5)
HCT: 33.4 % — ABNORMAL LOW (ref 34.8–46.6)
HGB: 11.4 g/dL — ABNORMAL LOW (ref 11.6–15.9)
LYMPH%: 9.7 % — AB (ref 14.0–49.7)
MCH: 31 pg (ref 25.1–34.0)
MCHC: 34.1 g/dL (ref 31.5–36.0)
MCV: 90.8 fL (ref 79.5–101.0)
MONO#: 0.3 10*3/uL (ref 0.1–0.9)
MONO%: 6.9 % (ref 0.0–14.0)
NEUT#: 3.7 10*3/uL (ref 1.5–6.5)
NEUT%: 80.2 % — ABNORMAL HIGH (ref 38.4–76.8)
PLATELETS: 213 10*3/uL (ref 145–400)
RBC: 3.68 10*6/uL — AB (ref 3.70–5.45)
RDW: 13.1 % (ref 11.2–14.5)
WBC: 4.6 10*3/uL (ref 3.9–10.3)
lymph#: 0.5 10*3/uL — ABNORMAL LOW (ref 0.9–3.3)

## 2016-03-29 ENCOUNTER — Encounter: Payer: Self-pay | Admitting: Radiation Oncology

## 2016-03-29 ENCOUNTER — Ambulatory Visit
Admission: RE | Admit: 2016-03-29 | Discharge: 2016-03-29 | Disposition: A | Discharge status: Home / Self Care | Payer: Self-pay | Source: Ambulatory Visit | Attending: Radiation Oncology | Admitting: Radiation Oncology

## 2016-03-29 ENCOUNTER — Ambulatory Visit: Payer: Self-pay | Attending: Radiation Oncology

## 2016-03-29 ENCOUNTER — Telehealth: Payer: Self-pay | Admitting: Hematology and Oncology

## 2016-03-29 ENCOUNTER — Ambulatory Visit (HOSPITAL_BASED_OUTPATIENT_CLINIC_OR_DEPARTMENT_OTHER): Payer: Self-pay | Admitting: Hematology and Oncology

## 2016-03-29 ENCOUNTER — Ambulatory Visit (HOSPITAL_COMMUNITY): Payer: Self-pay

## 2016-03-29 ENCOUNTER — Ambulatory Visit: Payer: Self-pay

## 2016-03-29 VITALS — BP 115/73 | HR 68 | Temp 98.1°F | Ht 65.0 in | Wt 155.4 lb

## 2016-03-29 DIAGNOSIS — C090 Malignant neoplasm of tonsillar fossa: Secondary | ICD-10-CM

## 2016-03-29 DIAGNOSIS — B370 Candidal stomatitis: Secondary | ICD-10-CM

## 2016-03-29 DIAGNOSIS — R634 Abnormal weight loss: Secondary | ICD-10-CM

## 2016-03-29 MED ORDER — NYSTATIN 100000 UNIT/ML MT SUSP: 5.0000 mL | Freq: Four times a day (QID) | OROMUCOSAL | 0 refills | Status: DC

## 2016-03-29 NOTE — Telephone Encounter (Signed)
Gave patient avs report and appointments for March  °

## 2016-03-29 NOTE — Progress Notes (Signed)
[name] presents for her 16th fraction of radiation to her Bilateral Neck and Left Tonsil. She denies pain at this time. She is eating or instilling 2 cans of ensure daily. I have encouraged her to increase the amount of ensure she is drinking daily. She is eating mostly liquids. She tells me that she is drinking about 5 sixteen ounce bottles of water daily. She saw Dr. Alvy Bimler today, and was prescribed nystatin for oral thrush. The skin over her neck is intact. She is using Biafine twice daily.   BP 115/73   Pulse 68   Temp 98.1 F (36.7 C)   Ht 5\' 5"  (1.651 m)   Wt 155 lb 6.4 oz (70.5 kg)   SpO2 100% Comment: room air  BMI 25.86 kg/m    Wt Readings from Last 3 Encounters:  03/29/16 155 lb 6.4 oz (70.5 kg)  03/29/16 156 lb 3.2 oz (70.9 kg)  03/22/16 157 lb 9.6 oz (71.5 kg)

## 2016-03-30 ENCOUNTER — Encounter: Payer: Self-pay | Admitting: Hematology and Oncology

## 2016-03-30 ENCOUNTER — Ambulatory Visit
Admission: RE | Admit: 2016-03-30 | Discharge: 2016-03-30 | Disposition: A | Discharge status: Home / Self Care | Payer: Self-pay | Source: Ambulatory Visit | Attending: Radiation Oncology | Admitting: Radiation Oncology

## 2016-03-30 DIAGNOSIS — B370 Candidal stomatitis: Secondary | ICD-10-CM | POA: Insufficient documentation

## 2016-03-30 DIAGNOSIS — R634 Abnormal weight loss: Secondary | ICD-10-CM | POA: Insufficient documentation

## 2016-03-30 NOTE — Progress Notes (Signed)
   Weekly Management Note:  Outpatient    ICD-9-CM ICD-10-CM   1. Cancer of tonsillar fossa (HCC) 146.1 C09.0     Current Dose:  32 Gy  Projected Dose: 70 Gy   Narrative:  The patient presents for routine under treatment assessment.  CBCT/MVCT images/Port film x-rays were reviewed.  The chart was checked. Doing well.  No significant pain. Dr Alvy Bimler has started nystatin.  Physical Findings:  Wt Readings from Last 3 Encounters:  03/29/16 155 lb 6.4 oz (70.5 kg)  03/29/16 156 lb 3.2 oz (70.9 kg)  03/22/16 157 lb 9.6 oz (71.5 kg)    height is 5\' 5"  (1.651 m) and weight is 155 lb 6.4 oz (70.5 kg). Her temperature is 98.1 F (36.7 C). Her blood pressure is 115/73 and her pulse is 68. Her oxygen saturation is 100%.  left tonsil tumor stable. Skin intact.  Mild mucositis over tonsillar tumor.   CBC    Component Value Date/Time   WBC 4.6 03/26/2016 1531   WBC 6.1 02/11/2016 0744   RBC 3.68 (L) 03/26/2016 1531   RBC 4.08 02/11/2016 0744   HGB 11.4 (L) 03/26/2016 1531   HCT 33.4 (L) 03/26/2016 1531   PLT 213 03/26/2016 1531   MCV 90.8 03/26/2016 1531   MCH 31.0 03/26/2016 1531   MCH 30.6 02/11/2016 0744   MCHC 34.1 03/26/2016 1531   MCHC 34.2 02/11/2016 0744   RDW 13.1 03/26/2016 1531   LYMPHSABS 0.5 (L) 03/26/2016 1531   MONOABS 0.3 03/26/2016 1531   EOSABS 0.1 03/26/2016 1531   BASOSABS 0.0 03/26/2016 1531     CMP     Component Value Date/Time   NA 136 03/26/2016 1531   K 4.0 03/26/2016 1531   CL 104 02/11/2016 0744   CO2 27 03/26/2016 1531   GLUCOSE 89 03/26/2016 1531   BUN 8.3 03/26/2016 1531   CREATININE 0.7 03/26/2016 1531   CALCIUM 9.4 03/26/2016 1531   PROT 7.3 03/26/2016 1531   ALBUMIN 3.9 03/26/2016 1531   AST 13 03/26/2016 1531   ALT 11 03/26/2016 1531   ALKPHOS 68 03/26/2016 1531   BILITOT 0.71 03/26/2016 1531   GFRNONAA >60 02/11/2016 0744   GFRAA >60 02/11/2016 0744    Impression:  The patient is tolerating radiotherapy.   Plan:  Continue  radiotherapy as planned. Lidocaine PRN sore throat.  Hycet for pain in future   -----------------------------------  Eppie Gibson, MD

## 2016-03-30 NOTE — Assessment & Plan Note (Signed)
She has mild progressive weight loss due to altered taste sensation and reduce oral intake. I reinforced the importance of increasing oral intake as tolerated and to also use nutritional supplement as directed by dietitian. The dietitian will see her on a weekly basis.

## 2016-03-30 NOTE — Assessment & Plan Note (Signed)
She has evidence of oral candidiasis and mucositis. I recommend nystatin swish and swallow and likely will reassess next week

## 2016-03-30 NOTE — Progress Notes (Signed)
[facility] OFFICE PROGRESS NOTE  Patient Care Team: Soyla Dryer, PA-C as PCP - General (Physician Assistant) No Pcp Per Patient (General Practice) Daneil Dolin, MD as Consulting Physician (Gastroenterology) Annitta Needs, NP (Gastroenterology) Leta Baptist, MD as Consulting Physician (Otolaryngology) Leota Sauers, RN as Oncology Nurse Navigator Eppie Gibson, MD as Attending Physician (Radiation Oncology)  SUMMARY OF ONCOLOGIC HISTORY:   Cancer of tonsillar fossa ([place])   01/15/2016 Procedure    Left tonsil biopsy by Dr. Benjamine Mola      01/15/2016 Initial Diagnosis    She presented to Dr. Benjamine Mola after being referred by [name], NP (GI) with a 5-6 month history of severe sore throat.  The patient never sought treatment for this issue previously.  Over the last 3 months, patient reported intermittent bleeding from left tonsil.  She also feels a hard mass along the left side of neck.  On physical exam by Dr. Benjamine Mola, he noted 3+ ulceration and erythema on the left tonsil.  On flexible laryngoscopy, tonsillar hypertrophy with ulceration was noted on the left.      01/19/2016 Pathology Results    Invasive squamous cell carcinoma, poorly differentiated.      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 Fine soft tissue details lost at these levels.  Pharyngeal mass centered in the left palatini tonsil with surround mucosal thickening as described below probably representing neoplasm.  Mucosal thickening of the left aspect of hypopharynx and pharynx with inferior most extension to the margin of the supraglottic airway without appreciable invasion into epiglottis, aryepiglottic folds, vocal cords, or paraglottic fat.  Superior most extension of mucosal thickening to the left aspect of the uvula and along the left aspect of the soft palate  to the hard/soft palate junction, this region is obscured by motion artifact.  Anteriorly there is effacement of left glossopharyngeal sulcus without appreciable tongue base invasion, this region is obscured by motion artifact.  Asymmetric enlargement of left-sided level 2 and 3 cervical lymph nodes possibly metastatic. No evidence for lymph node necrosis or extra nodal extension.      01/29/2016 Imaging    CT chest-  No evidence of metastatic disease in the chest.      02/06/2016 PET scan    1. Highly hypermetabolic left palatine tonsillar mass, maximum SUV 38.5. 2. A left station IIa lymph node measure 1.1 cm in short axis, mildly enlarged on image 38/3, but has only a borderline elevated SUV at 3.5. 3. Other imaging findings of potential clinical significance: Mild cardiomegaly. Calcified granuloma in the superior segment right lower lobe (benign). IUD satisfactorily positioned in the uterus.      02/11/2016 Procedure    Successful fluoroscopic insertion of a 20-French pull-through gastrostomy tube.      02/11/2016 Procedure    Successful placement of a right internal jugular approach power injectable Port-A-Cath. The catheter is ready for immediate use.      03/08/2016 -  Radiation Therapy    She received radiation treatment only       INTERVAL HISTORY: Please see below for problem oriented charting. She is here today with an interpreter. She has lost some weight. She has altered taste sensation and is not eating much by mouth. She denies painful swallowing or dysphagia. She is only using 2 bottles  of nutritional supplement through feeding tube recently She denies nausea or constipation  REVIEW OF SYSTEMS:   Constitutional: Denies fevers, chills Eyes: Denies blurriness of vision Respiratory: Denies cough, dyspnea or wheezes Cardiovascular: Denies palpitation, chest discomfort or lower extremity swelling Gastrointestinal:  Denies nausea, heartburn or change  in bowel habits Skin: Denies abnormal skin rashes Lymphatics: Denies new lymphadenopathy or easy bruising Neurological:Denies numbness, tingling or new weaknesses Behavioral/Psych: Mood is stable, no new changes  All other systems were reviewed with the patient and are negative.  I have reviewed the past medical history, past surgical history, social history and family history with the patient and they are unchanged from previous note.  ALLERGIES:  is allergic to no known allergies.  MEDICATIONS:  Current Outpatient Prescriptions  Medication Sig Dispense Refill  . ferrous sulfate 325 (65 FE) MG tablet Take 1 tablet (325 mg total) by mouth daily. 30 tablet 0  . levonorgestrel (MIRENA) 20 MCG/24HR IUD 1 each by Intrauterine route once.    . lidocaine (XYLOCAINE) 2 % solution Patient: Mix 1part 2% viscous lidocaine, 1part H20. Swish and swallow 45mL of this mixture, 59min before meals and at bedtime, up to QID (Patient not taking: Reported on 03/29/2016) 100 mL 5  . Multiple Vitamins-Minerals (MULTIVITAMIN WITH MINERALS) tablet Take 1 tablet by mouth daily.    . pantoprazole (PROTONIX) 40 MG tablet Take 1 tablet (40 mg total) by mouth 2 (two) times daily before a meal. Tome una tableta por boca diaria 60 tablet 3  . sodium fluoride (FLUORISHIELD) 1.1 % GEL dental gel Instill one drop of gel per tooth space of fluoride tray. Place over teeth for 5 minutes. Remove. Spit out excess. Repeat nightly. 120 mL PRN  . HYDROcodone-acetaminophen (HYCET) 7.5-325 mg/15 ml solution Take 10-15 mLs by mouth every 4 (four) hours as needed for moderate pain. (Patient not taking: Reported on 03/29/2016) 473 mL 0  . nystatin (MYCOSTATIN) 100000 UNIT/ML suspension Take 5 mLs (500,000 Units total) by mouth 4 (four) times daily. 473 mL 0  . oxyCODONE-acetaminophen (PERCOCET) 5-325 MG tablet Take one tablet by mouth every 4 hours as needed for pain. (Patient not taking: Reported on 03/10/2016) 32 tablet 0   No current  facility-administered medications for this visit.     PHYSICAL EXAMINATION: ECOG PERFORMANCE STATUS: 1 - Symptomatic but completely ambulatory  Vitals:   03/29/16 1457  BP: 106/66  Pulse: 74  Resp: 18  Temp: 98.2 F (36.8 C)   Filed Weights   03/29/16 1457  Weight: 156 lb 3.2 oz (70.9 kg)    GENERAL:alert, no distress and comfortable SKIN: skin color, texture, turgor are normal, no rashes or significant lesions EYES: normal, Conjunctiva are pink and non-injected, sclera clear OROPHARYNX: Noticed signs of mucositis and thrush NECK: supple, thyroid normal size, non-tender, without nodularity LYMPH:  no palpable lymphadenopathy in the cervical, axillary or inguinal LUNGS: clear to auscultation and percussion with normal breathing effort HEART: regular rate & rhythm and no murmurs and no lower extremity edema ABDOMEN:abdomen soft, non-tender and normal bowel sounds Musculoskeletal:no cyanosis of digits and no clubbing  NEURO: alert & oriented x 3 with fluent speech, no focal motor/sensory deficits  LABORATORY DATA:  I have reviewed the data as listed    Component Value Date/Time   NA 136 03/26/2016 1531   K 4.0 03/26/2016 1531   CL 104 02/11/2016 0744   CO2 27 03/26/2016 1531   GLUCOSE 89 03/26/2016 1531   BUN 8.3 03/26/2016 1531   CREATININE  0.7 03/26/2016 1531   CALCIUM 9.4 03/26/2016 1531   PROT 7.3 03/26/2016 1531   ALBUMIN 3.9 03/26/2016 1531   AST 13 03/26/2016 1531   ALT 11 03/26/2016 1531   ALKPHOS 68 03/26/2016 1531   BILITOT 0.71 03/26/2016 1531   GFRNONAA >60 02/11/2016 0744   GFRAA >60 02/11/2016 0744    No results found for: SPEP, UPEP  Lab Results  Component Value Date   WBC 4.6 03/26/2016   NEUTROABS 3.7 03/26/2016   HGB 11.4 (L) 03/26/2016   HCT 33.4 (L) 03/26/2016   MCV 90.8 03/26/2016   PLT 213 03/26/2016      Chemistry      Component Value Date/Time   NA 136 03/26/2016 1531   K 4.0 03/26/2016 1531   CL 104 02/11/2016 0744   CO2 27  03/26/2016 1531   BUN 8.3 03/26/2016 1531   CREATININE 0.7 03/26/2016 1531      Component Value Date/Time   CALCIUM 9.4 03/26/2016 1531   ALKPHOS 68 03/26/2016 1531   AST 13 03/26/2016 1531   ALT 11 03/26/2016 1531   BILITOT 0.71 03/26/2016 1531      ASSESSMENT & PLAN:  Cancer of tonsillar fossa ([place]) So far, she appears to be tolerating treatment well with expected side effects Examination today revealed evidence of yeast infection in her mouth  She has lost some weight with mild increased pain  I reinforced the importance of increasing her oral intake as tolerated. I plan to see her again next week  Oral candidiasis She has evidence of oral candidiasis and mucositis. I recommend nystatin swish and swallow and likely will reassess next week  Weight loss She has mild progressive weight loss due to altered taste sensation and reduce oral intake. I reinforced the importance of increasing oral intake as tolerated and to also use nutritional supplement as directed by dietitian. The dietitian will see her on a weekly basis.   No orders of the defined types were placed in this encounter.  All questions were answered. The patient knows to call the clinic with any problems, questions or concerns. No barriers to learning was detected. I spent 15 minutes counseling the patient face to face. The total time spent in the appointment was 20 minutes and more than 50% was on counseling and review of test results     [name] Lark, MD 03/30/2016 9:50 AM

## 2016-03-30 NOTE — Assessment & Plan Note (Signed)
So far, she appears to be tolerating treatment well with expected side effects Examination today revealed evidence of yeast infection in her mouth  She has lost some weight with mild increased pain  I reinforced the importance of increasing her oral intake as tolerated. I plan to see her again next week

## 2016-03-31 ENCOUNTER — Encounter (HOSPITAL_COMMUNITY): Payer: Self-pay

## 2016-03-31 ENCOUNTER — Ambulatory Visit
Admission: RE | Admit: 2016-03-31 | Discharge: 2016-03-31 | Disposition: A | Discharge status: Home / Self Care | Payer: Self-pay | Source: Ambulatory Visit | Attending: Radiation Oncology | Admitting: Radiation Oncology

## 2016-04-01 ENCOUNTER — Ambulatory Visit: Payer: Self-pay | Admitting: Nutrition

## 2016-04-01 ENCOUNTER — Ambulatory Visit
Admission: RE | Admit: 2016-04-01 | Discharge: 2016-04-01 | Disposition: A | Discharge status: Home / Self Care | Payer: Self-pay | Source: Ambulatory Visit | Attending: Radiation Oncology | Admitting: Radiation Oncology

## 2016-04-01 NOTE — Progress Notes (Signed)
Nutrition follow-up completed with patient receiving radiation therapy for tonsil cancer. Weight has decreased and was documented as 155.4 pounds March 19. Patient reports she cannot taste food very well.  However, her throat and mouth are no longer sore. She is using baking soda and salt water rinses. She is drinking 2 boost plus a day. She has given 2 boost plus via feeding tube daily. 4 boost plus provides 1440 cal, 56 g protein, 724 mL free water She denies nutrition impact symptoms. She is able to eat chicken soup and scrambled eggs. She wants to eat more food.  Nutrition diagnosis: Unintended weight loss continues.  Estimated nutrition needs: 2132-2485 calories, 92-107 grams protein, 2.5 L fluid.  Intervention: Patient educated to continue boost plus by mouth as well as oral food as tolerated. Recommended patient increase tube feeding to Osmolite 1.5 - 3 bottles daily with 60 cc free water before and after via feeding tube. 3 bottles Osmolite 1.5+2 bottles boost plus will provide 1785 cal, 62.7 g protein, 913 mL free water. This is greater than 80% of estimated calorie needs. Questions were answered.  Teach back method used.  Monitoring, evaluation, goals: Patient will tolerate oral intake plus tube feedings with minimal weight loss.  Next visit: Thursday, March 29, after radiation therapy.  **Disclaimer: This note was dictated with voice recognition software. Similar sounding words can inadvertently be transcribed and this note may contain transcription errors which may not have been corrected upon publication of note.**

## 2016-04-02 ENCOUNTER — Ambulatory Visit
Admission: RE | Admit: 2016-04-02 | Discharge: 2016-04-02 | Disposition: A | Discharge status: Home / Self Care | Payer: Self-pay | Source: Ambulatory Visit | Attending: Radiation Oncology | Admitting: Radiation Oncology

## 2016-04-02 ENCOUNTER — Encounter (HOSPITAL_COMMUNITY): Payer: Self-pay

## 2016-04-05 ENCOUNTER — Ambulatory Visit (HOSPITAL_BASED_OUTPATIENT_CLINIC_OR_DEPARTMENT_OTHER): Payer: Self-pay | Admitting: Hematology and Oncology

## 2016-04-05 ENCOUNTER — Ambulatory Visit
Admission: RE | Admit: 2016-04-05 | Discharge: 2016-04-05 | Disposition: A | Discharge status: Home / Self Care | Payer: Self-pay | Source: Ambulatory Visit | Attending: Radiation Oncology | Admitting: Radiation Oncology

## 2016-04-05 ENCOUNTER — Encounter: Payer: Self-pay | Admitting: *Deleted

## 2016-04-05 ENCOUNTER — Telehealth: Payer: Self-pay | Admitting: Hematology and Oncology

## 2016-04-05 ENCOUNTER — Encounter (HOSPITAL_COMMUNITY): Payer: Self-pay

## 2016-04-05 DIAGNOSIS — R634 Abnormal weight loss: Secondary | ICD-10-CM

## 2016-04-05 DIAGNOSIS — B370 Candidal stomatitis: Secondary | ICD-10-CM

## 2016-04-05 DIAGNOSIS — C090 Malignant neoplasm of tonsillar fossa: Secondary | ICD-10-CM

## 2016-04-05 MED ORDER — NYSTATIN 100000 UNIT/ML MT SUSP: 5.0000 mL | Freq: Four times a day (QID) | OROMUCOSAL | 0 refills | Status: DC

## 2016-04-05 MED FILL — NYSTATIN 100,000 UNITS/ML S: 100000 | 23 days supply | Qty: 473 | Fill #0

## 2016-04-05 NOTE — Telephone Encounter (Signed)
Add on MD appt on 4/2 per 04/05/2016 los. Gave patient AVS and calender per 04/05/2016 los.

## 2016-04-06 ENCOUNTER — Encounter: Payer: Self-pay | Admitting: Hematology and Oncology

## 2016-04-06 ENCOUNTER — Ambulatory Visit
Admission: RE | Admit: 2016-04-06 | Discharge: 2016-04-06 | Disposition: A | Discharge status: Home / Self Care | Payer: Self-pay | Source: Ambulatory Visit | Attending: Radiation Oncology | Admitting: Radiation Oncology

## 2016-04-06 NOTE — Assessment & Plan Note (Signed)
So far, she appears to be tolerating treatment well with expected side effects Examination today revealed persistent evidence of yeast infection in her mouth  She has no further weight loss since last week I reinforced the importance of increasing her oral intake as tolerated. I plan to see her again next week

## 2016-04-06 NOTE — Assessment & Plan Note (Signed)
She did not fill her prescription last week. She has persistent yeast infection. I refill her prescription for nystatin again.

## 2016-04-06 NOTE — Progress Notes (Signed)
[facility] OFFICE PROGRESS NOTE  Patient Care Team: Soyla Dryer, PA-C as PCP - General (Physician Assistant) No Pcp Per Patient (General Practice) Daneil Dolin, MD as Consulting Physician (Gastroenterology) Annitta Needs, NP (Gastroenterology) Leta Baptist, MD as Consulting Physician (Otolaryngology) Leota Sauers, RN as Oncology Nurse Navigator Eppie Gibson, MD as Attending Physician (Radiation Oncology)  SUMMARY OF ONCOLOGIC HISTORY:   Cancer of tonsillar fossa ([place])   01/15/2016 Procedure    Left tonsil biopsy by Dr. Benjamine Mola      01/15/2016 Initial Diagnosis    She presented to Dr. Benjamine Mola after being referred by [name], NP (GI) with a 5-6 month history of severe sore throat.  The patient never sought treatment for this issue previously.  Over the last 3 months, patient reported intermittent bleeding from left tonsil.  She also feels a hard mass along the left side of neck.  On physical exam by Dr. Benjamine Mola, he noted 3+ ulceration and erythema on the left tonsil.  On flexible laryngoscopy, tonsillar hypertrophy with ulceration was noted on the left.      01/19/2016 Pathology Results    Invasive squamous cell carcinoma, poorly differentiated.      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 Fine soft tissue details lost at these levels.  Pharyngeal mass centered in the left palatini tonsil with surround mucosal thickening as described below probably representing neoplasm.  Mucosal thickening of the left aspect of hypopharynx and pharynx with inferior most extension to the margin of the supraglottic airway without appreciable invasion into epiglottis, aryepiglottic folds, vocal cords, or paraglottic fat.  Superior most extension of mucosal thickening to the left aspect of the uvula and along the left aspect of the soft palate  to the hard/soft palate junction, this region is obscured by motion artifact.  Anteriorly there is effacement of left glossopharyngeal sulcus without appreciable tongue base invasion, this region is obscured by motion artifact.  Asymmetric enlargement of left-sided level 2 and 3 cervical lymph nodes possibly metastatic. No evidence for lymph node necrosis or extra nodal extension.      01/29/2016 Imaging    CT chest-  No evidence of metastatic disease in the chest.      02/06/2016 PET scan    1. Highly hypermetabolic left palatine tonsillar mass, maximum SUV 38.5. 2. A left station IIa lymph node measure 1.1 cm in short axis, mildly enlarged on image 38/3, but has only a borderline elevated SUV at 3.5. 3. Other imaging findings of potential clinical significance: Mild cardiomegaly. Calcified granuloma in the superior segment right lower lobe (benign). IUD satisfactorily positioned in the uterus.      02/11/2016 Procedure    Successful fluoroscopic insertion of a 20-French pull-through gastrostomy tube.      02/11/2016 Procedure    Successful placement of a right internal jugular approach power injectable Port-A-Cath. The catheter is ready for immediate use.      03/08/2016 -  Radiation Therapy    She received radiation treatment only       INTERVAL HISTORY: Please see below for problem oriented charting. She returns with her daughter-in-law who serves as an interpreter She is doing well. She is using at least 4 bottles of nutritional supplement per day in addition to oral intake. She has no recent weight  loss She denies pain. No dysphagia. She lost her prescription for nystatin and did not feel it recently She denies nausea, vomiting or constipation  REVIEW OF SYSTEMS:   Constitutional: Denies fevers, chills or abnormal weight loss Eyes: Denies blurriness of vision Ears, nose, mouth, throat, and face: Denies mucositis or sore throat Respiratory: Denies cough,  dyspnea or wheezes Cardiovascular: Denies palpitation, chest discomfort or lower extremity swelling Gastrointestinal:  Denies nausea, heartburn or change in bowel habits Skin: Denies abnormal skin rashes Lymphatics: Denies new lymphadenopathy or easy bruising Neurological:Denies numbness, tingling or new weaknesses Behavioral/Psych: Mood is stable, no new changes  All other systems were reviewed with the patient and are negative.  I have reviewed the past medical history, past surgical history, social history and family history with the patient and they are unchanged from previous note.  ALLERGIES:  is allergic to no known allergies.  MEDICATIONS:  Current Outpatient Prescriptions  Medication Sig Dispense Refill  . ferrous sulfate 325 (65 FE) MG tablet Take 1 tablet (325 mg total) by mouth daily. 30 tablet 0  . HYDROcodone-acetaminophen (HYCET) 7.5-325 mg/15 ml solution Take 10-15 mLs by mouth every 4 (four) hours as needed for moderate pain. (Patient not taking: Reported on 03/29/2016) 473 mL 0  . levonorgestrel (MIRENA) 20 MCG/24HR IUD 1 each by Intrauterine route once.    . lidocaine (XYLOCAINE) 2 % solution Patient: Mix 1part 2% viscous lidocaine, 1part H20. Swish and swallow 64mL of this mixture, 43min before meals and at bedtime, up to QID (Patient not taking: Reported on 03/29/2016) 100 mL 5  . Multiple Vitamins-Minerals (MULTIVITAMIN WITH MINERALS) tablet Take 1 tablet by mouth daily.    Marland Kitchen nystatin (MYCOSTATIN) 100000 UNIT/ML suspension Take 5 mLs (500,000 Units total) by mouth 4 (four) times daily. 473 mL 0  . oxyCODONE-acetaminophen (PERCOCET) 5-325 MG tablet Take one tablet by mouth every 4 hours as needed for pain. (Patient not taking: Reported on 03/10/2016) 32 tablet 0  . pantoprazole (PROTONIX) 40 MG tablet Take 1 tablet (40 mg total) by mouth 2 (two) times daily before a meal. Tome una tableta por boca diaria 60 tablet 3  . sodium fluoride (FLUORISHIELD) 1.1 % GEL dental gel  Instill one drop of gel per tooth space of fluoride tray. Place over teeth for 5 minutes. Remove. Spit out excess. Repeat nightly. 120 mL PRN   No current facility-administered medications for this visit.     PHYSICAL EXAMINATION: ECOG PERFORMANCE STATUS: 1 - Symptomatic but completely ambulatory  Vitals:   04/05/16 1535  BP: 114/70  Pulse: 71  Resp: 18  Temp: 98.4 F (36.9 C)   Filed Weights   04/05/16 1535  Weight: 157 lb 3.2 oz (71.3 kg)    GENERAL:alert, no distress and comfortable SKIN: Noted mild discoloration around her neck EYES: normal, Conjunctiva are pink and non-injected, sclera clear OROPHARYNX: Noted persistent oral thrush.  No significant mucositis or ulceration NECK: supple, thyroid normal size, non-tender, without nodularity LYMPH:  no palpable lymphadenopathy in the cervical, axillary or inguinal LUNGS: clear to auscultation and percussion with normal breathing effort HEART: regular rate & rhythm and no murmurs and no lower extremity edema ABDOMEN:abdomen soft, non-tender and normal bowel sounds Musculoskeletal:no cyanosis of digits and no clubbing  NEURO: alert & oriented x 3 with fluent speech, no focal motor/sensory deficits  LABORATORY DATA:  I have reviewed the data as listed    Component Value Date/Time   NA 136 03/26/2016 1531   K 4.0 03/26/2016 1531  CL 104 02/11/2016 0744   CO2 27 03/26/2016 1531   GLUCOSE 89 03/26/2016 1531   BUN 8.3 03/26/2016 1531   CREATININE 0.7 03/26/2016 1531   CALCIUM 9.4 03/26/2016 1531   PROT 7.3 03/26/2016 1531   ALBUMIN 3.9 03/26/2016 1531   AST 13 03/26/2016 1531   ALT 11 03/26/2016 1531   ALKPHOS 68 03/26/2016 1531   BILITOT 0.71 03/26/2016 1531   GFRNONAA >60 02/11/2016 0744   GFRAA >60 02/11/2016 0744    No results found for: SPEP, UPEP  Lab Results  Component Value Date   WBC 4.6 03/26/2016   NEUTROABS 3.7 03/26/2016   HGB 11.4 (L) 03/26/2016   HCT 33.4 (L) 03/26/2016   MCV 90.8 03/26/2016    PLT 213 03/26/2016      Chemistry      Component Value Date/Time   NA 136 03/26/2016 1531   K 4.0 03/26/2016 1531   CL 104 02/11/2016 0744   CO2 27 03/26/2016 1531   BUN 8.3 03/26/2016 1531   CREATININE 0.7 03/26/2016 1531      Component Value Date/Time   CALCIUM 9.4 03/26/2016 1531   ALKPHOS 68 03/26/2016 1531   AST 13 03/26/2016 1531   ALT 11 03/26/2016 1531   BILITOT 0.71 03/26/2016 1531      ASSESSMENT & PLAN:  Cancer of tonsillar fossa ([place]) So far, she appears to be tolerating treatment well with expected side effects Examination today revealed persistent evidence of yeast infection in her mouth  She has no further weight loss since last week I reinforced the importance of increasing her oral intake as tolerated. I plan to see her again next week  Oral candidiasis She did not fill her prescription last week. She has persistent yeast infection. I refill her prescription for nystatin again.  Weight loss She had recent weight loss. After coaching and encouragement, she is compliant taking additional nutritional supplement as directed. She has no further weight loss since I saw her last week. I continue to encourage her to increase oral intake as tolerated.  I will continue to reassess next week.   No orders of the defined types were placed in this encounter.  All questions were answered. The patient knows to call the clinic with any problems, questions or concerns. No barriers to learning was detected. I spent 15 minutes counseling the patient face to face. The total time spent in the appointment was 20 minutes and more than 50% was on counseling and review of test results     [name] Lark, MD 04/06/2016 7:43 AM

## 2016-04-06 NOTE — Progress Notes (Signed)
Oncology Nurse Navigator Documentation  To provide support, encouragement and care continuity, met with patient during Established Patient appt with Dr. Alvy Bimler.  She was accompanied by her dtr-in-law, Katie. She arrived with 2 lb weight gain since last visit. She is drinking 2 Boost and instilling 2 Boost via PEG daily; eating soft foods (eg eggs, oatmeal) and soups. She denied nausea, dysphagia, recognized minor throat pain. She denied needs/concerns. They understand I can be contacted as needed.  Gayleen Orem, RN, BSN, [facility] Oncology Nurse [place] at [place] [phone]

## 2016-04-06 NOTE — Assessment & Plan Note (Signed)
She had recent weight loss. After coaching and encouragement, she is compliant taking additional nutritional supplement as directed. She has no further weight loss since I saw her last week. I continue to encourage her to increase oral intake as tolerated.  I will continue to reassess next week.

## 2016-04-07 ENCOUNTER — Ambulatory Visit
Admission: RE | Admit: 2016-04-07 | Discharge: 2016-04-07 | Disposition: A | Discharge status: Home / Self Care | Payer: Self-pay | Source: Ambulatory Visit | Attending: Radiation Oncology | Admitting: Radiation Oncology

## 2016-04-07 ENCOUNTER — Encounter (HOSPITAL_COMMUNITY): Payer: Self-pay

## 2016-04-08 ENCOUNTER — Ambulatory Visit
Admission: RE | Admit: 2016-04-08 | Discharge: 2016-04-08 | Disposition: A | Discharge status: Home / Self Care | Payer: Self-pay | Source: Ambulatory Visit | Attending: Radiation Oncology | Admitting: Radiation Oncology

## 2016-04-08 ENCOUNTER — Ambulatory Visit: Payer: Self-pay | Admitting: Nutrition

## 2016-04-08 NOTE — Progress Notes (Signed)
Nutrition follow-up was completed with patient who receives radiation therapy for tonsil cancer. Weight improved documented as 157.2 pounds March 26 increased from 155.4 pounds March 19 She is drinking 2 boost plus daily been using 2 Osmolite 1.5 via her feeding tube daily. She continues to eat small amounts of food No new complaints.  Nutrition diagnosis: Unintended weight loss has improved.  Intervention: Educated patient to continue current oral intake and tube feeding to meet greater than 90% of estimated nutrition needs for weight stabilization. Teach back method used.  Monitoring, evaluation, goals: Patient will continue to tolerate oral intake plus tube feedings to promote weight stabilization.  Next visit: Thursday, April 5 after radiation therapy.  **Disclaimer: This note was dictated with voice recognition software. Similar sounding words can inadvertently be transcribed and this note may contain transcription errors which may not have been corrected upon publication of note.**

## 2016-04-09 ENCOUNTER — Ambulatory Visit
Admission: RE | Admit: 2016-04-09 | Discharge: 2016-04-09 | Disposition: A | Discharge status: Home / Self Care | Payer: Self-pay | Source: Ambulatory Visit | Attending: Radiation Oncology | Admitting: Radiation Oncology

## 2016-04-09 ENCOUNTER — Encounter (HOSPITAL_COMMUNITY): Payer: Self-pay

## 2016-04-12 ENCOUNTER — Encounter: Payer: Self-pay | Admitting: Radiation Oncology

## 2016-04-12 ENCOUNTER — Ambulatory Visit
Admission: RE | Admit: 2016-04-12 | Discharge: 2016-04-12 | Disposition: A | Discharge status: Home / Self Care | Payer: Self-pay | Source: Ambulatory Visit | Attending: Radiation Oncology | Admitting: Radiation Oncology

## 2016-04-12 ENCOUNTER — Telehealth: Payer: Self-pay | Admitting: Hematology and Oncology

## 2016-04-12 ENCOUNTER — Ambulatory Visit (HOSPITAL_BASED_OUTPATIENT_CLINIC_OR_DEPARTMENT_OTHER): Payer: Self-pay | Admitting: Hematology and Oncology

## 2016-04-12 ENCOUNTER — Telehealth: Payer: Self-pay | Admitting: *Deleted

## 2016-04-12 ENCOUNTER — Encounter (HOSPITAL_COMMUNITY): Payer: Self-pay

## 2016-04-12 ENCOUNTER — Encounter: Payer: Self-pay | Admitting: Hematology and Oncology

## 2016-04-12 DIAGNOSIS — C090 Malignant neoplasm of tonsillar fossa: Secondary | ICD-10-CM

## 2016-04-12 DIAGNOSIS — K1233 Oral mucositis (ulcerative) due to radiation: Secondary | ICD-10-CM

## 2016-04-12 DIAGNOSIS — R634 Abnormal weight loss: Secondary | ICD-10-CM

## 2016-04-12 MED ORDER — HYDROCODONE-ACETAMINOPHEN 7.5-325 MG/15ML PO SOLN: 10.0000 mL | ORAL | 0 refills | Status: DC | PRN

## 2016-04-12 NOTE — Telephone Encounter (Signed)
Appointments scheduled per 4.2.18 LOS. Patient given AVS report and calendars with future scheduled appointments. °

## 2016-04-12 NOTE — Progress Notes (Signed)
   Weekly Management Note:  Outpatient    ICD-9-CM ICD-10-CM   1. Cancer of tonsillar fossa ([facility]) 146.1 C09.0 HYDROcodone-acetaminophen (HYCET) 7.5-325 mg/15 ml solution    Current Dose:  52 Gy  Projected Dose: 70 Gy   Narrative:  The patient presents for routine under treatment assessment.  CBCT/MVCT images/Port film x-rays were reviewed.  The chart was checked. Patient reports 6/10 pain around her tongue and throat. She is taking Hycet every 4 hours. Per nursing, she has sore on the left side of her tongue. She reports only drinking liquids and not eating solid foods. She puts 4 cans of ensure through her PEG tube per day. She is using Biafine. She was seen in Dr. Calton Dach office prior to this visit. She presents with an interpreter.   Physical Findings:  Wt Readings from Last 3 Encounters:  04/12/16 153 lb 1.6 oz (69.4 kg)  04/05/16 157 lb 3.2 oz (71.3 kg)  03/29/16 155 lb 6.4 oz (70.5 kg)   Vitals with BMI 04/12/2016  Height 5\' 5"   Weight 153 lbs 2 oz  BMI 50.2  Systolic 774  Diastolic 67  Pulse 76  Respirations 18    Confluent mucositis along the soft palate and pharyngeal area. Moderate hyperpigmentation and erythema along the neck area without skin breakdown.  CBC    Component Value Date/Time   WBC 4.6 03/26/2016 1531   WBC 6.1 02/11/2016 0744   RBC 3.68 (L) 03/26/2016 1531   RBC 4.08 02/11/2016 0744   HGB 11.4 (L) 03/26/2016 1531   HCT 33.4 (L) 03/26/2016 1531   PLT 213 03/26/2016 1531   MCV 90.8 03/26/2016 1531   MCH 31.0 03/26/2016 1531   MCH 30.6 02/11/2016 0744   MCHC 34.1 03/26/2016 1531   MCHC 34.2 02/11/2016 0744   RDW 13.1 03/26/2016 1531   LYMPHSABS 0.5 (L) 03/26/2016 1531   MONOABS 0.3 03/26/2016 1531   EOSABS 0.1 03/26/2016 1531   BASOSABS 0.0 03/26/2016 1531     CMP     Component Value Date/Time   NA 136 03/26/2016 1531   K 4.0 03/26/2016 1531   CL 104 02/11/2016 0744   CO2 27 03/26/2016 1531   GLUCOSE 89 03/26/2016 1531   BUN 8.3  03/26/2016 1531   CREATININE 0.7 03/26/2016 1531   CALCIUM 9.4 03/26/2016 1531   PROT 7.3 03/26/2016 1531   ALBUMIN 3.9 03/26/2016 1531   AST 13 03/26/2016 1531   ALT 11 03/26/2016 1531   ALKPHOS 68 03/26/2016 1531   BILITOT 0.71 03/26/2016 1531   GFRNONAA >60 02/11/2016 0744   GFRAA >60 02/11/2016 0744    Impression:  The patient is tolerating radiotherapy.   Plan:  Continue radiotherapy as planned. Refilled Hycet prescription As she may run out before the weekend.  -----------------------------------   This document serves as a record of services personally performed by Gery Pray, MD. It was created on his behalf by Bethann Humble, a trained medical scribe. The creation of this record is based on the scribe's personal observations and the provider's statements to them. This document has been checked and approved by the attending provider.

## 2016-04-12 NOTE — Assessment & Plan Note (Signed)
She has progressive mucositis pain from recent treatment She has pain medicine to take as needed

## 2016-04-12 NOTE — Progress Notes (Signed)
Aleyda has completed 26 fractions to her bilateral neck and left tonsil.  She reports having pain at a 6/10 around her tongue and because of a sore throat.  She is taking hycet q 4 hours.  She has sores on the left side of her tongue.  She is using nystatin and rinsing with baking soda/salt/water rinses.  She reports she is only drinking liquids and is not eating solid foods.  She is putting 4 cans of ensure through her peg tube per day.  The skin on her neck has hyperpigmentation.  She is using biafine.  Vitals were taken in Dr. Calton Dach office today: bp 110/67, hr 76, rr 18, temp 99.0 an SpO2 99%.  Wt Readings from Last 3 Encounters:  04/12/16 153 lb 1.6 oz (69.4 kg)  04/05/16 157 lb 3.2 oz (71.3 kg)  03/29/16 155 lb 6.4 oz (70.5 kg)

## 2016-04-12 NOTE — Telephone Encounter (Signed)
Oncology Nurse Navigator Documentation  Received VM from [place], patient's d-in-law, indicating she cannot join her m-in-law today to interpret, requested arrangment for interpretor.  I spoke with Adria, Office of Inclusion, arranged for interpretor, LVMM for Katie to confirm arrangements made.  Gayleen Orem, RN, BSN, [place] [facility] Oncology Nurse [place] at [place] [phone]

## 2016-04-12 NOTE — Assessment & Plan Note (Signed)
So far, she appears to be tolerating treatment well with expected side effects Examination today revealed near complete resolution of recent yeast infection She has mild, progressive weight loss due to inability to eat by mouth I reinforced the importance of increasing her oral intake as tolerated and to increase nutritional supplement to 6 cans of supplement per day I plan to see her again next week

## 2016-04-12 NOTE — Progress Notes (Signed)
[facility] OFFICE PROGRESS NOTE  Patient Care Team: Soyla Dryer, PA-C as PCP - General (Physician Assistant) No Pcp Per Patient (General Practice) Heather Dolin, MD as Consulting Physician (Gastroenterology) Heather Needs, NP (Gastroenterology) Heather Baptist, MD as Consulting Physician (Otolaryngology) Heather Sauers, RN as Oncology Nurse Navigator Heather Gibson, MD as Attending Physician (Radiation Oncology)  SUMMARY OF ONCOLOGIC HISTORY:   Cancer of tonsillar fossa ([place])   01/15/2016 Procedure    Left tonsil biopsy by Dr. Benjamine Mola      01/15/2016 Initial Diagnosis    She presented to Dr. Benjamine Mola after being referred by [name], NP (GI) with a 5-6 month history of severe sore throat.  The patient never sought treatment for this issue previously.  Over the last 3 months, patient reported intermittent bleeding from left tonsil.  She also feels a hard mass along the left side of neck.  On physical exam by Dr. Benjamine Mola, he noted 3+ ulceration and erythema on the left tonsil.  On flexible laryngoscopy, tonsillar hypertrophy with ulceration was noted on the left.      01/19/2016 Pathology Results    Invasive squamous cell carcinoma, poorly differentiated.      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 Fine soft tissue details lost at these levels.  Pharyngeal mass centered in the left palatini tonsil with surround mucosal thickening as described below probably representing neoplasm.  Mucosal thickening of the left aspect of hypopharynx and pharynx with inferior most extension to the margin of the supraglottic airway without appreciable invasion into epiglottis, aryepiglottic folds, vocal cords, or paraglottic fat.  Superior most extension of mucosal thickening to the left aspect of the uvula and along the left aspect of the soft palate  to the hard/soft palate junction, this region is obscured by motion artifact.  Anteriorly there is effacement of left glossopharyngeal sulcus without appreciable tongue base invasion, this region is obscured by motion artifact.  Asymmetric enlargement of left-sided level 2 and 3 cervical lymph nodes possibly metastatic. No evidence for lymph node necrosis or extra nodal extension.      01/29/2016 Imaging    CT chest-  No evidence of metastatic disease in the chest.      02/06/2016 PET scan    1. Highly hypermetabolic left palatine tonsillar mass, maximum SUV 38.5. 2. A left station IIa lymph node measure 1.1 cm in short axis, mildly enlarged on image 38/3, but has only a borderline elevated SUV at 3.5. 3. Other imaging findings of potential clinical significance: Mild cardiomegaly. Calcified granuloma in the superior segment right lower lobe (benign). IUD satisfactorily positioned in the uterus.      02/11/2016 Procedure    Successful fluoroscopic insertion of a 20-French pull-through gastrostomy tube.      02/11/2016 Procedure    Successful placement of a right internal jugular approach power injectable Port-A-Cath. The catheter is ready for immediate use.      03/08/2016 -  Radiation Therapy    She received radiation treatment only       INTERVAL HISTORY: Please see below for problem oriented charting. She is seen with family members who are interpreting for her She is starting to have progressive mucositis pain that is prohibiting her from eating much by mouth She denies dysphagia; no recent profound nausea or  vomiting She denies constipation She is using 4 cans of nutritional supplement per day She is able to hydrate herself adequately  REVIEW OF SYSTEMS:   Constitutional: Denies fevers, chills  Eyes: Denies blurriness of vision Respiratory: Denies cough, dyspnea or wheezes Cardiovascular: Denies palpitation, chest discomfort or lower extremity  swelling Gastrointestinal:  Denies nausea, heartburn or change in bowel habits Skin: Denies abnormal skin rashes Lymphatics: Denies new lymphadenopathy or easy bruising Neurological:Denies numbness, tingling or new weaknesses Behavioral/Psych: Mood is stable, no new changes  All other systems were reviewed with the patient and are negative.  I have reviewed the past medical history, past surgical history, social history and family history with the patient and they are unchanged from previous note.  ALLERGIES:  is allergic to no known allergies.  MEDICATIONS:  Current Outpatient Prescriptions  Medication Sig Dispense Refill  . ferrous sulfate 325 (65 FE) MG tablet Take 1 tablet (325 mg total) by mouth daily. 30 tablet 0  . HYDROcodone-acetaminophen (HYCET) 7.5-325 mg/15 ml solution Take 10-15 mLs by mouth every 4 (four) hours as needed for moderate pain. (Patient not taking: Reported on 03/29/2016) 473 mL 0  . levonorgestrel (MIRENA) 20 MCG/24HR IUD 1 each by Intrauterine route once.    . lidocaine (XYLOCAINE) 2 % solution Patient: Mix 1part 2% viscous lidocaine, 1part H20. Swish and swallow 39mL of this mixture, 3min before meals and at bedtime, up to QID (Patient not taking: Reported on 03/29/2016) 100 mL 5  . Multiple Vitamins-Minerals (MULTIVITAMIN WITH MINERALS) tablet Take 1 tablet by mouth daily.    Marland Kitchen nystatin (MYCOSTATIN) 100000 UNIT/ML suspension Take 5 mLs (500,000 Units total) by mouth 4 (four) times daily. 473 mL 0  . oxyCODONE-acetaminophen (PERCOCET) 5-325 MG tablet Take one tablet by mouth every 4 hours as needed for pain. (Patient not taking: Reported on 03/10/2016) 32 tablet 0  . pantoprazole (PROTONIX) 40 MG tablet Take 1 tablet (40 mg total) by mouth 2 (two) times daily before a meal. Tome una tableta por boca diaria 60 tablet 3  . sodium fluoride (FLUORISHIELD) 1.1 % GEL dental gel Instill one drop of gel per tooth space of fluoride tray. Place over teeth for 5 minutes.  Remove. Spit out excess. Repeat nightly. 120 mL PRN   No current facility-administered medications for this visit.     PHYSICAL EXAMINATION: ECOG PERFORMANCE STATUS: 1 - Symptomatic but completely ambulatory  Vitals:   04/12/16 1513  BP: 110/67  Pulse: 76  Resp: 18  Temp: 99 F (37.2 C)   Filed Weights   04/12/16 1513  Weight: 153 lb 1.6 oz (69.4 kg)    GENERAL:alert, no distress and comfortable SKIN: Noted minor skin discoloration around her face and neck EYES: normal, Conjunctiva are pink and non-injected, sclera clear OROPHARYNX: Noted mucositis change.  Rash has almost completely resolved.  No ulceration NECK: supple, thyroid normal size, non-tender, without nodularity LYMPH:  no palpable lymphadenopathy in the cervical, axillary or inguinal LUNGS: clear to auscultation and percussion with normal breathing effort HEART: regular rate & rhythm and no murmurs and no lower extremity edema ABDOMEN:abdomen soft, non-tender and normal bowel sounds.  Feeding tube site looks okay Musculoskeletal:no cyanosis of digits and no clubbing  NEURO: alert & oriented x 3 with fluent speech, no focal motor/sensory deficits  LABORATORY DATA:  I have reviewed the data as listed    Component Value Date/Time   NA 136 03/26/2016 1531   K 4.0 03/26/2016 1531   CL 104 02/11/2016 0744  CO2 27 03/26/2016 1531   GLUCOSE 89 03/26/2016 1531   BUN 8.3 03/26/2016 1531   CREATININE 0.7 03/26/2016 1531   CALCIUM 9.4 03/26/2016 1531   PROT 7.3 03/26/2016 1531   ALBUMIN 3.9 03/26/2016 1531   AST 13 03/26/2016 1531   ALT 11 03/26/2016 1531   ALKPHOS 68 03/26/2016 1531   BILITOT 0.71 03/26/2016 1531   GFRNONAA >60 02/11/2016 0744   GFRAA >60 02/11/2016 0744    No results found for: SPEP, UPEP  Lab Results  Component Value Date   WBC 4.6 03/26/2016   NEUTROABS 3.7 03/26/2016   HGB 11.4 (L) 03/26/2016   HCT 33.4 (L) 03/26/2016   MCV 90.8 03/26/2016   PLT 213 03/26/2016      Chemistry       Component Value Date/Time   NA 136 03/26/2016 1531   K 4.0 03/26/2016 1531   CL 104 02/11/2016 0744   CO2 27 03/26/2016 1531   BUN 8.3 03/26/2016 1531   CREATININE 0.7 03/26/2016 1531      Component Value Date/Time   CALCIUM 9.4 03/26/2016 1531   ALKPHOS 68 03/26/2016 1531   AST 13 03/26/2016 1531   ALT 11 03/26/2016 1531   BILITOT 0.71 03/26/2016 1531       ASSESSMENT & PLAN:  Cancer of tonsillar fossa ([place]) So far, she appears to be tolerating treatment well with expected side effects Examination today revealed near complete resolution of recent yeast infection She has mild, progressive weight loss due to inability to eat by mouth I reinforced the importance of increasing her oral intake as tolerated and to increase nutritional supplement to 6 cans of supplement per day I plan to see her again next week  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 After coaching and encouragement, she is compliant taking additional nutritional supplement as directed. I continue to encourage her to increase oral intake as tolerated.  I will continue to reassess next week. She is still able to drink enough hydration by mouth. She does not need IV fluid support   No orders of the defined types were placed in this encounter.  All questions were answered. The patient knows to call the clinic with any problems, questions or concerns. No barriers to learning was detected. I spent 15 minutes counseling the patient face to face. The total time spent in the appointment was 20 minutes and more than 50% was on counseling and review of test results     [name] Lark, MD 04/12/2016 3:14 PM

## 2016-04-12 NOTE — Assessment & Plan Note (Signed)
She had recent weight loss. After coaching and encouragement, she is compliant taking additional nutritional supplement as directed. I continue to encourage her to increase oral intake as tolerated.  I will continue to reassess next week. She is still able to drink enough hydration by mouth. She does not need IV fluid support

## 2016-04-13 ENCOUNTER — Other Ambulatory Visit: Payer: Self-pay | Admitting: Obstetrics and Gynecology

## 2016-04-13 ENCOUNTER — Ambulatory Visit
Admission: RE | Admit: 2016-04-13 | Discharge: 2016-04-13 | Disposition: A | Discharge status: Home / Self Care | Payer: Self-pay | Source: Ambulatory Visit | Attending: Radiation Oncology | Admitting: Radiation Oncology

## 2016-04-13 DIAGNOSIS — Z1231 Encounter for screening mammogram for malignant neoplasm of breast: Secondary | ICD-10-CM

## 2016-04-14 ENCOUNTER — Encounter: Payer: Self-pay | Admitting: *Deleted

## 2016-04-14 ENCOUNTER — Encounter (HOSPITAL_COMMUNITY): Payer: Self-pay

## 2016-04-14 ENCOUNTER — Ambulatory Visit
Admission: RE | Admit: 2016-04-14 | Discharge: 2016-04-14 | Disposition: A | Discharge status: Home / Self Care | Payer: Self-pay | Source: Ambulatory Visit | Attending: Radiation Oncology | Admitting: Radiation Oncology

## 2016-04-14 NOTE — Progress Notes (Signed)
Oncology Nurse Navigator Documentation  In follow-up to page from [facility] [place], met with patient and d-in-law to answer concerns about mouth sores. Patient indicated she has developed small painful blisters on tongue.  She indicated she is using viscous lidocaine TID but d-in-law suspects less often, taking Hycet at bedtime only b/c helps her to sleep. I explained blisters are expected, encouraged lidocaine use as prescribed, increased use of Hycet for additional pain control.  I noted importance of taking currently prescribed Rx in order for MD to determine appropriate adjustments.   Patient noted increasing thickened saliva.  I encouraged frequent use of salt water/baking soda rinse as tolerated. They voiced understanding of guidance.  Gayleen Orem, RN, BSN, [place] [facility] Oncology Nurse [facility] at [place] [phone]

## 2016-04-14 NOTE — Progress Notes (Signed)
Oncology Nurse Navigator Documentation  Flowsheet update.  Rick Diehl, RN, BSN, CHPN [facility] Oncology Nurse Navigator [facility] at [place] [phone]  

## 2016-04-15 ENCOUNTER — Encounter: Payer: Self-pay | Admitting: Nutrition

## 2016-04-15 ENCOUNTER — Ambulatory Visit
Admission: RE | Admit: 2016-04-15 | Discharge: 2016-04-15 | Disposition: A | Discharge status: Home / Self Care | Payer: Self-pay | Source: Ambulatory Visit | Attending: Radiation Oncology | Admitting: Radiation Oncology

## 2016-04-16 ENCOUNTER — Ambulatory Visit
Admission: RE | Admit: 2016-04-16 | Discharge: 2016-04-16 | Disposition: A | Discharge status: Home / Self Care | Payer: Self-pay | Source: Ambulatory Visit | Attending: Radiation Oncology | Admitting: Radiation Oncology

## 2016-04-16 ENCOUNTER — Encounter (HOSPITAL_COMMUNITY): Payer: Self-pay

## 2016-04-19 ENCOUNTER — Ambulatory Visit (HOSPITAL_COMMUNITY): Payer: Self-pay

## 2016-04-19 ENCOUNTER — Ambulatory Visit
Admission: RE | Admit: 2016-04-19 | Discharge: 2016-04-19 | Disposition: A | Discharge status: Home / Self Care | Payer: Self-pay | Source: Ambulatory Visit | Attending: Radiation Oncology | Admitting: Radiation Oncology

## 2016-04-19 ENCOUNTER — Other Ambulatory Visit: Payer: Self-pay | Admitting: Radiation Oncology

## 2016-04-19 ENCOUNTER — Ambulatory Visit (HOSPITAL_COMMUNITY): Payer: Self-pay | Admitting: Oncology

## 2016-04-19 DIAGNOSIS — C090 Malignant neoplasm of tonsillar fossa: Secondary | ICD-10-CM

## 2016-04-19 MED ORDER — AMOXICILLIN 250 MG/5ML PO SUSR
500.0000 mg | Freq: Two times a day (BID) | ORAL | 0 refills | Status: DC
Start: 2016-04-19 — End: 2016-05-14

## 2016-04-19 MED FILL — AMOXICILLIN 250 MG/5 ML SUS: 250 | 7 days supply | Qty: 150 | Fill #0

## 2016-04-20 ENCOUNTER — Ambulatory Visit
Admission: RE | Admit: 2016-04-20 | Discharge: 2016-04-20 | Disposition: A | Discharge status: Home / Self Care | Payer: Self-pay | Source: Ambulatory Visit | Attending: Radiation Oncology | Admitting: Radiation Oncology

## 2016-04-21 ENCOUNTER — Encounter: Payer: Self-pay | Admitting: Nutrition

## 2016-04-21 ENCOUNTER — Encounter (HOSPITAL_COMMUNITY): Payer: Self-pay

## 2016-04-21 ENCOUNTER — Ambulatory Visit
Admission: RE | Admit: 2016-04-21 | Discharge: 2016-04-21 | Disposition: A | Discharge status: Home / Self Care | Payer: Self-pay | Source: Ambulatory Visit | Attending: Radiation Oncology | Admitting: Radiation Oncology

## 2016-04-22 ENCOUNTER — Other Ambulatory Visit (HOSPITAL_COMMUNITY): Payer: Self-pay | Admitting: Oncology

## 2016-04-22 ENCOUNTER — Ambulatory Visit
Admission: RE | Admit: 2016-04-22 | Discharge: 2016-04-22 | Disposition: A | Discharge status: Home / Self Care | Payer: Self-pay | Source: Ambulatory Visit | Attending: Radiation Oncology | Admitting: Radiation Oncology

## 2016-04-22 ENCOUNTER — Ambulatory Visit (HOSPITAL_BASED_OUTPATIENT_CLINIC_OR_DEPARTMENT_OTHER): Payer: Self-pay | Admitting: Hematology and Oncology

## 2016-04-22 ENCOUNTER — Encounter: Payer: Self-pay | Admitting: Hematology and Oncology

## 2016-04-22 ENCOUNTER — Encounter: Payer: Self-pay | Admitting: *Deleted

## 2016-04-22 VITALS — BP 127/69 | HR 78 | Temp 99.0°F | Resp 18 | Ht 65.0 in | Wt 155.1 lb

## 2016-04-22 DIAGNOSIS — C090 Malignant neoplasm of tonsillar fossa: Secondary | ICD-10-CM

## 2016-04-22 DIAGNOSIS — Z95828 Presence of other vascular implants and grafts: Secondary | ICD-10-CM | POA: Insufficient documentation

## 2016-04-22 DIAGNOSIS — K1233 Oral mucositis (ulcerative) due to radiation: Secondary | ICD-10-CM

## 2016-04-22 MED ORDER — SODIUM CHLORIDE 0.9% FLUSH
10.0000 mL | INTRAVENOUS | Status: DC | PRN
  Administered 2016-04-22: 10 mL via INTRAVENOUS
  Filled 2016-04-22: qty 10

## 2016-04-22 MED ORDER — HEPARIN SOD (PORK) LOCK FLUSH 100 UNIT/ML IV SOLN
500.0000 [IU] | Freq: Once | INTRAVENOUS | Status: AC
  Administered 2016-04-22: 500 [IU] via INTRAVENOUS
  Filled 2016-04-22: qty 5

## 2016-04-22 NOTE — Assessment & Plan Note (Signed)
So far, she appears to be tolerating treatment well with expected side effects Examination today revealed complete resolution of recent yeast infection She has gained weight since the last time I saw her and is able to tolerate at least 6 cans of nutritional supplement without problems I reinforced the importance of increasing her oral intake as tolerated and to increase nutritional supplement to 6 cans of supplement per day Her radiation treatment will finish tomorrow We will get her port flushed today I will get her physician in [place] to flush her port in 6-8 weeks I would defer future follow-up to her radiation oncologist

## 2016-04-22 NOTE — Assessment & Plan Note (Signed)
She has mucositis pain from recent treatment She has pain medicine to take as needed

## 2016-04-22 NOTE — Progress Notes (Signed)
Met with patient following Established Patient appt with Dr. Alvy Bimler.  She was accompanied by her husband and child.  D-in-law Katie provided interpretation by phone. I supported Dr. Calton Dach explanation:  Future PAC flushes will be conducted at [facility], closer to her home.  Restaging PET will be scheduled in 4 months at which time [facility] likely to be removed. I explained:  Criteria for PEG removal is 100% oral intake and weight management.  Follow-up with Dr. Isidore Moos to be scheduled, additional follow-up to be provided at [facility]. Patient/Katie voiced understanding. They understand to call me with needs/concerns.  Gayleen Orem, RN, BSN, [place] [facility] Oncology Nurse [place] [facility] [place] [phone]

## 2016-04-22 NOTE — Patient Instructions (Signed)
Implanted Port Home Guide An implanted port is a type of central line that is placed under the skin. Central lines are used to provide IV access when treatment or nutrition needs to be given through a person's veins. Implanted ports are used for long-term IV access. An implanted port may be placed because:  You need IV medicine that would be irritating to the small veins in your hands or arms.  You need long-term IV medicines, such as antibiotics.  You need IV nutrition for a long period.  You need frequent blood draws for lab tests.  You need dialysis.  Implanted ports are usually placed in the chest area, but they can also be placed in the upper arm, the abdomen, or the leg. An implanted port has two main parts:  Reservoir. The reservoir is round and will appear as a small, raised area under your skin. The reservoir is the part where a needle is inserted to give medicines or draw blood.  Catheter. The catheter is a thin, flexible tube that extends from the reservoir. The catheter is placed into a large vein. Medicine that is inserted into the reservoir goes into the catheter and then into the vein.  How will I care for my incision site? Do not get the incision site wet. Bathe or shower as directed by your health care provider. How is my port accessed? Special steps must be taken to access the port:  Before the port is accessed, a numbing cream can be placed on the skin. This helps numb the skin over the port site.  Your health care provider uses a sterile technique to access the port. ? Your health care provider must put on a mask and sterile gloves. ? The skin over your port is cleaned carefully with an antiseptic and allowed to dry. ? The port is gently pinched between sterile gloves, and a needle is inserted into the port.  Only "non-coring" port needles should be used to access the port. Once the port is accessed, a blood return should be checked. This helps ensure that the port  is in the vein and is not clogged.  If your port needs to remain accessed for a constant infusion, a clear (transparent) bandage will be placed over the needle site. The bandage and needle will need to be changed every week, or as directed by your health care provider.  Keep the bandage covering the needle clean and dry. Do not get it wet. Follow your health care provider's instructions on how to take a shower or bath while the port is accessed.  If your port does not need to stay accessed, no bandage is needed over the port.  What is flushing? Flushing helps keep the port from getting clogged. Follow your health care provider's instructions on how and when to flush the port. Ports are usually flushed with saline solution or a medicine called heparin. The need for flushing will depend on how the port is used.  If the port is used for intermittent medicines or blood draws, the port will need to be flushed: ? After medicines have been given. ? After blood has been drawn. ? As part of routine maintenance.  If a constant infusion is running, the port may not need to be flushed.  How long will my port stay implanted? The port can stay in for as long as your health care provider thinks it is needed. When it is time for the port to come out, surgery will be   done to remove it. The procedure is similar to the one performed when the port was put in. When should I seek immediate medical care? When you have an implanted port, you should seek immediate medical care if:  You notice a bad smell coming from the incision site.  You have swelling, redness, or drainage at the incision site.  You have more swelling or pain at the port site or the surrounding area.  You have a fever that is not controlled with medicine.  This information is not intended to replace advice given to you by your health care provider. Make sure you discuss any questions you have with your health care provider. Document  Released: 12/28/2004 Document Revised: 06/05/2015 Document Reviewed: 09/04/2012 Elsevier Interactive Patient Education  2017 [employer]  

## 2016-04-22 NOTE — Progress Notes (Signed)
[facility] OFFICE PROGRESS NOTE  Patient Care Team: Soyla Dryer, PA-C as PCP - General (Physician Assistant) No Pcp Per Patient (General Practice) Daneil Dolin, MD as Consulting Physician (Gastroenterology) Annitta Needs, NP (Gastroenterology) Leta Baptist, MD as Consulting Physician (Otolaryngology) Leota Sauers, RN as Oncology Nurse Navigator Eppie Gibson, MD as Attending Physician (Radiation Oncology)  SUMMARY OF ONCOLOGIC HISTORY:   Cancer of tonsillar fossa ([place])   01/15/2016 Procedure    Left tonsil biopsy by Dr. Benjamine Mola      01/15/2016 Initial Diagnosis    She presented to Dr. Benjamine Mola after being referred by [name], NP (GI) with a 5-6 month history of severe sore throat.  The patient never sought treatment for this issue previously.  Over the last 3 months, patient reported intermittent bleeding from left tonsil.  She also feels a hard mass along the left side of neck.  On physical exam by Dr. Benjamine Mola, he noted 3+ ulceration and erythema on the left tonsil.  On flexible laryngoscopy, tonsillar hypertrophy with ulceration was noted on the left.      01/19/2016 Pathology Results    Invasive squamous cell carcinoma, poorly differentiated.      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 Fine soft tissue details lost at these levels.  Pharyngeal mass centered in the left palatini tonsil with surround mucosal thickening as described below probably representing neoplasm.  Mucosal thickening of the left aspect of hypopharynx and pharynx with inferior most extension to the margin of the supraglottic airway without appreciable invasion into epiglottis, aryepiglottic folds, vocal cords, or paraglottic fat.  Superior most extension of mucosal thickening to the left aspect of the uvula and along the left aspect of the soft palate  to the hard/soft palate junction, this region is obscured by motion artifact.  Anteriorly there is effacement of left glossopharyngeal sulcus without appreciable tongue base invasion, this region is obscured by motion artifact.  Asymmetric enlargement of left-sided level 2 and 3 cervical lymph nodes possibly metastatic. No evidence for lymph node necrosis or extra nodal extension.      01/29/2016 Imaging    CT chest-  No evidence of metastatic disease in the chest.      02/06/2016 PET scan    1. Highly hypermetabolic left palatine tonsillar mass, maximum SUV 38.5. 2. A left station IIa lymph node measure 1.1 cm in short axis, mildly enlarged on image 38/3, but has only a borderline elevated SUV at 3.5. 3. Other imaging findings of potential clinical significance: Mild cardiomegaly. Calcified granuloma in the superior segment right lower lobe (benign). IUD satisfactorily positioned in the uterus.      02/11/2016 Procedure    Successful fluoroscopic insertion of a 20-French pull-through gastrostomy tube.      02/11/2016 Procedure    Successful placement of a right internal jugular approach power injectable Port-A-Cath. The catheter is ready for immediate use.      03/08/2016 -  Radiation Therapy    She received radiation treatment only       INTERVAL HISTORY: Please see below for problem oriented charting. She is seen prior to her radiation treatment  She is doing well She is able to tolerate 6 cans of nutritional supplement and has gained some weight She has some mild persistent mucositis.  She  is taking prescription pain medicine as prescribed She has some mild nausea but no vomiting Denies constipation  REVIEW OF SYSTEMS:   Constitutional: Denies fevers, chills or abnormal weight loss Eyes: Denies blurriness of vision Ears, nose, mouth, throat, and face: Denies mucositis or sore throat Respiratory: Denies cough, dyspnea or wheezes Cardiovascular: Denies  palpitation, chest discomfort or lower extremity swelling Lymphatics: Denies new lymphadenopathy or easy bruising Neurological:Denies numbness, tingling or new weaknesses Behavioral/Psych: Mood is stable, no new changes  All other systems were reviewed with the patient and are negative.  I have reviewed the past medical history, past surgical history, social history and family history with the patient and they are unchanged from previous note.  ALLERGIES:  is allergic to no known allergies.  MEDICATIONS:  Current Outpatient Prescriptions  Medication Sig Dispense Refill  . amoxicillin (AMOXIL) 250 MG/5ML suspension Take 10 mLs (500 mg total) by mouth 2 (two) times daily. May take by feeding tube. Finish entire prescription. 140 mL 0  . ferrous sulfate 325 (65 FE) MG tablet Take 1 tablet (325 mg total) by mouth daily. 30 tablet 0  . HYDROcodone-acetaminophen (HYCET) 7.5-325 mg/15 ml solution Take 10-15 mLs by mouth every 4 (four) hours as needed for moderate pain. 473 mL 0  . levonorgestrel (MIRENA) 20 MCG/24HR IUD 1 each by Intrauterine route once.    . lidocaine (XYLOCAINE) 2 % solution Patient: Mix 1part 2% viscous lidocaine, 1part H20. Swish and swallow 57mL of this mixture, 68min before meals and at bedtime, up to QID 100 mL 5  . nystatin (MYCOSTATIN) 100000 UNIT/ML suspension Take 5 mLs (500,000 Units total) by mouth 4 (four) times daily. 473 mL 0  . sodium fluoride (FLUORISHIELD) 1.1 % GEL dental gel Instill one drop of gel per tooth space of fluoride tray. Place over teeth for 5 minutes. Remove. Spit out excess. Repeat nightly. 120 mL PRN  . Multiple Vitamins-Minerals (MULTIVITAMIN WITH MINERALS) tablet Take 1 tablet by mouth daily.    . pantoprazole (PROTONIX) 40 MG tablet Take 1 tablet (40 mg total) by mouth 2 (two) times daily before a meal. Tome una tableta por boca diaria (Patient not taking: Reported on 04/12/2016) 60 tablet 3   Current Facility-Administered Medications  Medication  Dose Route Frequency Provider Last Rate Last Dose  . heparin lock flush 100 unit/mL  500 Units Intravenous Once [name] Lark, MD      . sodium chloride flush (NS) 0.9 % injection 10 mL  10 mL Intravenous PRN [name] Lark, MD        PHYSICAL EXAMINATION: ECOG PERFORMANCE STATUS: 1 - Symptomatic but completely ambulatory  Vitals:   04/22/16 1515  BP: 127/69  Pulse: 78  Resp: 18  Temp: 99 F (37.2 C)   Filed Weights   04/22/16 1515  Weight: 155 lb 1.6 oz (70.4 kg)    GENERAL:alert, no distress and comfortable SKIN: Noted some skin peeling.  No ulcerated skin lesions around her neck  Noted mucositis.  No thrush EYES: normal, Conjunctiva are pink and non-injected, sclera clear OROPHARYNX: Noted mucositis.  No thrush NECK: supple, thyroid normal size, non-tender, without nodularity LYMPH:  no palpable lymphadenopathy in the cervical, axillary or inguinal LUNGS: clear to auscultation and percussion with normal breathing effort HEART: regular rate & rhythm and no murmurs and no lower extremity edema ABDOMEN:abdomen soft, non-tender and normal bowel sounds Musculoskeletal:no cyanosis of digits and no clubbing  NEURO: alert & oriented x 3 with fluent speech, no focal motor/sensory deficits  LABORATORY  DATA:  I have reviewed the data as listed    Component Value Date/Time   NA 136 03/26/2016 1531   K 4.0 03/26/2016 1531   CL 104 02/11/2016 0744   CO2 27 03/26/2016 1531   GLUCOSE 89 03/26/2016 1531   BUN 8.3 03/26/2016 1531   CREATININE 0.7 03/26/2016 1531   CALCIUM 9.4 03/26/2016 1531   PROT 7.3 03/26/2016 1531   ALBUMIN 3.9 03/26/2016 1531   AST 13 03/26/2016 1531   ALT 11 03/26/2016 1531   ALKPHOS 68 03/26/2016 1531   BILITOT 0.71 03/26/2016 1531   GFRNONAA >60 02/11/2016 0744   GFRAA >60 02/11/2016 0744    No results found for: SPEP, UPEP  Lab Results  Component Value Date   WBC 4.6 03/26/2016   NEUTROABS 3.7 03/26/2016   HGB 11.4 (L) 03/26/2016   HCT 33.4 (L)  03/26/2016   MCV 90.8 03/26/2016   PLT 213 03/26/2016      Chemistry      Component Value Date/Time   NA 136 03/26/2016 1531   K 4.0 03/26/2016 1531   CL 104 02/11/2016 0744   CO2 27 03/26/2016 1531   BUN 8.3 03/26/2016 1531   CREATININE 0.7 03/26/2016 1531      Component Value Date/Time   CALCIUM 9.4 03/26/2016 1531   ALKPHOS 68 03/26/2016 1531   AST 13 03/26/2016 1531   ALT 11 03/26/2016 1531   BILITOT 0.71 03/26/2016 1531       ASSESSMENT & PLAN:  Cancer of tonsillar fossa ([place]) So far, she appears to be tolerating treatment well with expected side effects Examination today revealed complete resolution of recent yeast infection She has gained weight since the last time I saw her and is able to tolerate at least 6 cans of nutritional supplement without problems I reinforced the importance of increasing her oral intake as tolerated and to increase nutritional supplement to 6 cans of supplement per day Her radiation treatment will finish tomorrow We will get her port flushed today I will get her physician in [place] to flush her port in 6-8 weeks I would defer future follow-up to her radiation oncologist  Mucositis due to radiation therapy She has mucositis pain from recent treatment She has pain medicine to take as needed   No orders of the defined types were placed in this encounter.  All questions were answered. The patient knows to call the clinic with any problems, questions or concerns. No barriers to learning was detected. I spent 15 minutes counseling the patient face to face. The total time spent in the appointment was 20 minutes and more than 50% was on counseling and review of test results     [name] Lark, MD 04/22/2016 3:38 PM

## 2016-04-23 ENCOUNTER — Encounter (HOSPITAL_COMMUNITY): Payer: Self-pay

## 2016-04-23 ENCOUNTER — Ambulatory Visit
Admission: RE | Admit: 2016-04-23 | Discharge: 2016-04-23 | Disposition: A | Discharge status: Home / Self Care | Payer: Self-pay | Source: Ambulatory Visit | Attending: Radiation Oncology | Admitting: Radiation Oncology

## 2016-04-26 ENCOUNTER — Encounter (HOSPITAL_COMMUNITY): Payer: Self-pay

## 2016-04-27 ENCOUNTER — Ambulatory Visit (HOSPITAL_COMMUNITY): Payer: Self-pay | Admitting: Adult Health

## 2016-04-28 ENCOUNTER — Encounter (HOSPITAL_COMMUNITY): Payer: Self-pay

## 2016-04-28 ENCOUNTER — Encounter: Payer: Self-pay | Admitting: Radiation Oncology

## 2016-04-28 NOTE — Progress Notes (Signed)
  Radiation Oncology         [phone] ________________________________  Name: Heather Strickland MRN: [id number]  Date: 04/28/2016  DOB: 03/17/1975  End of Treatment Note  Diagnosis:   Squamous cell carcinoma of the left tonsil, Clinical Stage I (cT2, cN1, cM0 p16 positive)  Indication for treatment:  Curative without chemotherapy  Radiation treatment dates:   03/08/16 - 04/23/16  Site/dose:     Left Tonsil and Bilateral neck / 70 Gy in 35 fractions to gross disease, 63 Gy in 35 fractions to high risk nodal echelons, and 56 Gy in 35 fractions to intermediate risk nodal echelons  Beams/energy:   Helical IMRT / 6 MV photons  Narrative: The patient tolerated radiation treatment relatively well. She experienced throat pain, as well as left ear itching. The patient experienced radiation related skin changes including hyperpigmentation and dryness to the skin over the neck. She also developed confluent mucositis. Near the end of treatment, the patient's left tympanic membrane appeared erythematous.  Plan: The patient has completed radiation treatment. She was prescribed Amoxicillin for possible ear infection; she will take this as directed. The patient will return to radiation oncology clinic for routine followup in one half month. I advised the patient to call or return sooner if any questions or concerns arise that are related to recovery or treatment.  -----------------------------------  Eppie Gibson, MD  This document serves as a record of services personally performed by Eppie Gibson, MD. It was created on her behalf by Maryla Morrow, a trained medical scribe. The creation of this record is based on the scribe's personal observations and the provider's statements to them. This document has been checked and approved by the attending provider.

## 2016-04-29 ENCOUNTER — Ambulatory Visit (HOSPITAL_COMMUNITY): Payer: Self-pay

## 2016-04-30 ENCOUNTER — Encounter (HOSPITAL_COMMUNITY): Payer: Self-pay

## 2016-04-30 ENCOUNTER — Ambulatory Visit: Payer: Self-pay | Admitting: Hematology and Oncology

## 2016-05-03 ENCOUNTER — Encounter (HOSPITAL_COMMUNITY): Payer: Self-pay

## 2016-05-05 ENCOUNTER — Encounter (HOSPITAL_COMMUNITY): Payer: Self-pay

## 2016-05-07 ENCOUNTER — Encounter (HOSPITAL_COMMUNITY): Payer: Self-pay

## 2016-05-10 ENCOUNTER — Encounter (HOSPITAL_COMMUNITY): Payer: Self-pay

## 2016-05-11 ENCOUNTER — Encounter: Payer: Self-pay | Admitting: Radiation Oncology

## 2016-05-13 NOTE — Progress Notes (Signed)
Heather Strickland presents for follow up of radiation completed 04/23/16 to her Left Tonsil and Bilateral neck.   Pain issues, if any: She denies pain except slight pain when swallowing.  Using a feeding tube?: She is still using her feeding tube once daily for 1 can of ensure.  Weight changes, if any:  04/19/16 152.4 lb 05/14/16 148.2 lb Swallowing issues, if any: She is able to eat foods normally, she does chew her food more because of a dry mouth Smoking or chewing tobacco? No Using fluoride trays daily? No, they are ordered but she misunderstood and thought she could stop them.  Last ENT visit was on: Not since diagnosis.  Other notable issues, if any:   She has redness to the right of her feeding tube. She tells me that occasionally it will bleed. It is not painful. The skin to her neck has healed with hyperpigmentation present.   BP 102/68   Pulse 69   Temp 98.2 F (36.8 C)   Ht 5\' 5"  (1.651 m)   Wt 148 lb 3.2 oz (67.2 kg)   SpO2 99% Comment: room air  BMI 24.66 kg/m

## 2016-05-14 ENCOUNTER — Ambulatory Visit
Admission: RE | Admit: 2016-05-14 | Discharge: 2016-05-14 | Disposition: A | Discharge status: Home / Self Care | Payer: Self-pay | Source: Ambulatory Visit | Attending: Radiation Oncology | Admitting: Radiation Oncology

## 2016-05-14 ENCOUNTER — Encounter: Payer: Self-pay | Admitting: Radiation Oncology

## 2016-05-14 VITALS — BP 102/68 | HR 69 | Temp 98.2°F | Ht 65.0 in | Wt 148.2 lb

## 2016-05-14 DIAGNOSIS — C090 Malignant neoplasm of tonsillar fossa: Secondary | ICD-10-CM | POA: Insufficient documentation

## 2016-05-14 DIAGNOSIS — Z79899 Other long term (current) drug therapy: Secondary | ICD-10-CM | POA: Insufficient documentation

## 2016-05-14 DIAGNOSIS — Z931 Gastrostomy status: Secondary | ICD-10-CM | POA: Insufficient documentation

## 2016-05-14 DIAGNOSIS — R634 Abnormal weight loss: Secondary | ICD-10-CM | POA: Insufficient documentation

## 2016-05-14 DIAGNOSIS — R042 Hemoptysis: Secondary | ICD-10-CM | POA: Insufficient documentation

## 2016-05-14 HISTORY — DX: Personal history of irradiation: Z92.3

## 2016-05-14 NOTE — Progress Notes (Signed)
Radiation Oncology         [phone] ________________________________  Name: Heather Strickland MRN: [id number]  Date: 05/14/2016  DOB: 1975-03-22  Follow-Up Visit Note  CC: Soyla Dryer, PA-C  [place], Kelby Fam, MD  Diagnosis and Prior Radiotherapy:       ICD-9-CM ICD-10-CM   1. Loss of weight 783.21 R63.4 TSH  2. Cancer of tonsillar fossa ([facility]) 146.1 C09.0 NM PET Image Restag (PS) Skull Base To Thigh     TSH     Squamous cell carcinoma of the left tonsil, Clinical Stage I (cT2, cN1, cM0 p16 positive)  03/08/16 - 04/23/16 Site/dose: Left Tonsil and Bilateral neck / 70 Gy in 35 fractions to gross disease, 63 Gy in 35 fractions to high risk nodal echelons, and 56 Gy in 35 fractions to intermediate risk nodal echelons  CHIEF COMPLAINT:  Here for follow-up and surveillance of left tonsil cancer  Narrative:  The patient returns today for routine follow-up. The patient's daughter-in-law acted as an Astronomer.  The patient has slight swallowing pain. She is still using her feeding tube once daily with 1 can of Ensure. She has lost approximately 4 lbs since 04/19/16. She has to chew her food more and carefully due to a dry mouth. She is not using fluoride trays because she though she could stop them. She now knows to started using them. She has not seen the ENT since her diagnosis. She reports redness and discharge around her PEG tube with occasional bleeding. She denies pain associated with her PEG tube. She reports a scant amount of hemoptysis.  Overall very well.  ALLERGIES:  is allergic to no known allergies.  Meds: Current Outpatient Prescriptions  Medication Sig Dispense Refill  . ferrous sulfate 325 (65 FE) MG tablet Take 1 tablet (325 mg total) by mouth daily. 30 tablet 0  . levonorgestrel (MIRENA) 20 MCG/24HR IUD 1 each by Intrauterine route once.    . Multiple Vitamins-Minerals (MULTIVITAMIN WITH MINERALS) tablet Take 1 tablet by mouth daily.    Marland Kitchen nystatin (MYCOSTATIN)  100000 UNIT/ML suspension Take 5 mLs (500,000 Units total) by mouth 4 (four) times daily. 473 mL 0  . HYDROcodone-acetaminophen (HYCET) 7.5-325 mg/15 ml solution Take 10-15 mLs by mouth every 4 (four) hours as needed for moderate pain. (Patient not taking: Reported on 05/14/2016) 473 mL 0  . lidocaine (XYLOCAINE) 2 % solution Patient: Mix 1part 2% viscous lidocaine, 1part H20. Swish and swallow 63mL of this mixture, 46min before meals and at bedtime, up to QID (Patient not taking: Reported on 05/14/2016) 100 mL 5  . pantoprazole (PROTONIX) 40 MG tablet Take 1 tablet (40 mg total) by mouth 2 (two) times daily before a meal. Tome una tableta por boca diaria (Patient not taking: Reported on 04/12/2016) 60 tablet 3  . sodium fluoride (FLUORISHIELD) 1.1 % GEL dental gel Instill one drop of gel per tooth space of fluoride tray. Place over teeth for 5 minutes. Remove. Spit out excess. Repeat nightly. (Patient not taking: Reported on 05/14/2016) 120 mL PRN   No current facility-administered medications for this encounter.     Physical Findings: The patient is in no acute distress. Patient is alert and oriented. Wt Readings from Last 3 Encounters:  05/14/16 148 lb 3.2 oz (67.2 kg)  04/22/16 155 lb 1.6 oz (70.4 kg)  04/12/16 153 lb 1.6 oz (69.4 kg)    height is 5\' 5"  (1.651 m) and weight is 148 lb 3.2 oz (67.2 kg). Her temperature is 98.2 F (  36.8 C). Her blood pressure is 102/68 and her pulse is 69. Her oxygen saturation is 99%. .  General: Alert and oriented, in no acute distress HEENT: Head is normocephalic. Extraocular movements are intact. Oropharynx is notable for mucositis in her posterior pharynx, no sign of tumor or active bleeding, healing well, mucous membranes moist. Neck: Neck is notable for no palpable masses in the cervical or supraclavicular regions. Skin: Skin in treatment fields shows satisfactory healing. Hyperpigmentation of the anterior neck. Heart: Regular in rate and rhythm with no  murmurs, rubs, or gallops. Chest: Clear to auscultation bilaterally, with no rhonchi, wheezes, or rales. Abdomen: Soft, nontender, nondistended, with no rigidity or guarding. Granulation tissue with no active bleeding around the stoma of the PEG tube. Extremities: No cyanosis or edema. Lymphatics: see Neck Exam Psychiatric: Judgment and insight are intact. Affect is appropriate.   Lab Findings: Lab Results  Component Value Date   WBC 4.6 03/26/2016   HGB 11.4 (L) 03/26/2016   HCT 33.4 (L) 03/26/2016   MCV 90.8 03/26/2016   PLT 213 03/26/2016    Lab Results  Component Value Date   TSH 0.360 02/23/2016    Radiographic Findings: No results found.  Impression/Plan:    1) Head and Neck Cancer Status: Recovering, NED on clinical exam.  2) Nutritional Status: Eating and drinking PO.  Wt Readings from Last 3 Encounters:  05/14/16 148 lb 3.2 oz (67.2 kg)  04/22/16 155 lb 1.6 oz (70.4 kg)  04/12/16 153 lb 1.6 oz (69.4 kg)     PEG tube: Yes. Supplementing with 1 Ensure daily by PEG. Talked about how to wean from PEG  3) Swallowing: Some pain associated with swallowing. Tolerable   4) Dental: Encouraged to continue regular followup with dentistry, and dental hygiene including fluoride rinses.  I stressed the importance of using the fluoride trays for dental health.  5) Thyroid function:  wNL Lab Results  Component Value Date   TSH 0.360 02/23/2016    6) Other: The patient is scheduled to see Renato Battles, NP on 06/17/16 and port flush the same day. I advised the patient to start using Vitamin E lotion on her neck.  survivorship: f/u in [facility] clinic.  7) Follow-up in 2.5 - 3 months. PET scan before her follow up. The patient was encouraged to call with any issues or questions before then. ENT will start following after PET results.   _____________________________________   Eppie Gibson, MD  This document serves as a record of services personally performed by Eppie Gibson, MD. It  was created on her behalf by Darcus Austin, a trained medical scribe. The creation of this record is based on the scribe's personal observations and the provider's statements to them. This document has been checked and approved by the attending provider.

## 2016-05-25 ENCOUNTER — Encounter (HOSPITAL_COMMUNITY): Payer: Self-pay | Admitting: Dentistry

## 2016-05-25 ENCOUNTER — Ambulatory Visit (HOSPITAL_COMMUNITY): Payer: Self-pay | Admitting: Dentistry

## 2016-05-25 VITALS — BP 116/66 | HR 75 | Temp 98.1°F | Wt 150.0 lb

## 2016-05-25 DIAGNOSIS — Z0189 Encounter for other specified special examinations: Secondary | ICD-10-CM

## 2016-05-25 DIAGNOSIS — K029 Dental caries, unspecified: Secondary | ICD-10-CM

## 2016-05-25 DIAGNOSIS — R1310 Dysphagia, unspecified: Secondary | ICD-10-CM

## 2016-05-25 DIAGNOSIS — R432 Parageusia: Secondary | ICD-10-CM

## 2016-05-25 DIAGNOSIS — Z923 Personal history of irradiation: Secondary | ICD-10-CM

## 2016-05-25 DIAGNOSIS — K117 Disturbances of salivary secretion: Secondary | ICD-10-CM

## 2016-05-25 DIAGNOSIS — C099 Malignant neoplasm of tonsil, unspecified: Secondary | ICD-10-CM

## 2016-05-25 DIAGNOSIS — R682 Dry mouth, unspecified: Secondary | ICD-10-CM

## 2016-05-25 MED ORDER — SODIUM FLUORIDE 1.1 % DT CREA: TOPICAL_CREAM | DENTAL | 99 refills | Status: DC

## 2016-05-25 NOTE — Progress Notes (Signed)
05/25/2016  Patient Name:   Heather Strickland Date of Birth:   February 27, 1975 Medical Record Number: [id number]  BP 116/66   Pulse 75   Temp 98.1 F (36.7 C) (Oral)   Wt 150 lb (68 kg)   BMI 24.96 kg/m   [name] presents for oral examination after radiation therapy. Patient has completed all radiation treatments from 03/08/16 thru 04/23/16. No chemotherapy.  REVIEW OF CHIEF COMPLAINTS:  DRY MOUTH: Yes HARD TO SWALLOW: Yes, at times. HURT TO SWALLOW: Yes, at times. TASTE CHANGES: Taste is returning slowly. SORES IN MOUTH: None TRISMUS: NO TRISMUS SYMPTOMS. WEIGHT: 150 lbs. Was 158 at start.  HOME OH REGIMEN:  BRUSHING: Twice a day. FLOSSING: Yes. RINSING: Rinse with salt water and baking soda as needed FLUORIDE: At bedtime TRISMUS EXERCISES:  Maximum interincisal opening: 45 mm  DENTAL EXAM:  Oral Hygiene:(PLAQUE): Good oral hygiene LOCATION OF MUCOSITIS: None noted DESCRIPTION OF SALIVA: Decreased saliva-incipient to moderate xerostomia ANY EXPOSED BONE: None noted OTHER WATCHED AREAS: Previous extraction sites, dental  caries involving tooth numbers 2, 3, 9, and 31. DX: Xerostomia, Dysgeusia, Dysphagia and dental caries  RECOMMENDATIONS: 1. Brush after meals and at bedtime.  Use fluoride at bedtime. A prescription for PreviDent 5000 plus was written until FluoroSHIELD comes back in stock at [facility]. Refills for one year was given for the PreviDent 5000. 2. Use trismus exercises as directed. 3. Use Biotene Rinse or salt water/baking soda rinses as needed. 4. Multiple sips of water as needed. 5. Follow up with General Dentist of her choice for an exam, cleaning, and dental restorations in 2-3 months.  Lenn Cal, DDS

## 2016-05-25 NOTE — Patient Instructions (Addendum)
RECOMMENDATIONS: 1. Brush after meals and at bedtime.  Use fluoride at bedtime. A prescription for PreviDent 5000 plus was written until FluoroSHIELD comes back in stock at [facility]. Refills for one year was given for the PreviDent 5000. 2. Use trismus exercises as directed. 3. Use Biotene Rinse or salt water/baking soda rinses as needed. 4. Multiple sips of water as needed. 5. Follow up with General Dentist of her choice for an exam, cleaning, and dental restorations in 2-3 months.  Lenn Cal, DDS

## 2016-06-08 ENCOUNTER — Telehealth: Payer: Self-pay | Admitting: *Deleted

## 2016-06-08 NOTE — Telephone Encounter (Signed)
Oncology Nurse Navigator Documentation  Received call from patient's dtr-in-law. She reported patient:  Eating/drinking 100% orally, no longer using PEG.  She drinks 2 Boost daily, eats 3 meals daily, drinks ample fluids.  Maintaining weight.  Patient is ready for PEG to be removed. I informed Dr. Isidore Moos.  Gayleen Orem, RN, BSN, [place] [facility] Oncology Nurse [place] [place] [phone]

## 2016-06-09 ENCOUNTER — Other Ambulatory Visit: Payer: Self-pay | Admitting: Radiation Oncology

## 2016-06-09 DIAGNOSIS — C090 Malignant neoplasm of tonsillar fossa: Secondary | ICD-10-CM

## 2016-06-12 NOTE — Addendum Note (Signed)
Addendum  created 06/12/16 0930 by Duane Boston, MD   Sign clinical note

## 2016-06-17 ENCOUNTER — Encounter (HOSPITAL_COMMUNITY): Payer: Self-pay | Admitting: Adult Health

## 2016-06-17 ENCOUNTER — Encounter (HOSPITAL_COMMUNITY): Payer: Self-pay | Attending: Oncology

## 2016-06-17 ENCOUNTER — Encounter (HOSPITAL_COMMUNITY): Payer: Self-pay | Attending: Adult Health | Admitting: Adult Health

## 2016-06-17 VITALS — BP 106/75 | HR 97 | Resp 16 | Ht 62.0 in | Wt 144.0 lb

## 2016-06-17 DIAGNOSIS — C090 Malignant neoplasm of tonsillar fossa: Secondary | ICD-10-CM

## 2016-06-17 DIAGNOSIS — R634 Abnormal weight loss: Secondary | ICD-10-CM

## 2016-06-17 DIAGNOSIS — E039 Hypothyroidism, unspecified: Secondary | ICD-10-CM

## 2016-06-17 LAB — COMPREHENSIVE METABOLIC PANEL
ALT: 9 U/L — AB (ref 14–54)
AST: 14 U/L — ABNORMAL LOW (ref 15–41)
Albumin: 4 g/dL (ref 3.5–5.0)
Alkaline Phosphatase: 52 U/L (ref 38–126)
Anion gap: 6 (ref 5–15)
BILIRUBIN TOTAL: 0.8 mg/dL (ref 0.3–1.2)
BUN: 10 mg/dL (ref 6–20)
CHLORIDE: 103 mmol/L (ref 101–111)
CO2: 27 mmol/L (ref 22–32)
CREATININE: 0.43 mg/dL — AB (ref 0.44–1.00)
Calcium: 9.5 mg/dL (ref 8.9–10.3)
GFR calc Af Amer: >60 mL/min (ref >60)
GFR calc non Af Amer: >60 mL/min (ref >60)
Glucose, Bld: 92 mg/dL (ref 65–99)
Potassium: 3.8 mmol/L (ref 3.5–5.1)
Sodium: 136 mmol/L (ref 135–145)
Total Protein: 7.1 g/dL (ref 6.5–8.1)

## 2016-06-17 LAB — CBC WITH DIFFERENTIAL/PLATELET
BASOS ABS: 0 10*3/uL (ref 0.0–0.1)
Basophils Relative: 1 %
Eosinophils Absolute: 0.1 10*3/uL (ref 0.0–0.7)
Eosinophils Relative: 2 %
HEMATOCRIT: 34.4 % — AB (ref 36.0–46.0)
HEMOGLOBIN: 11.8 g/dL — AB (ref 12.0–15.0)
LYMPHS PCT: 15 %
Lymphs Abs: 0.6 10*3/uL — ABNORMAL LOW (ref 0.7–4.0)
MCH: 31.3 pg (ref 26.0–34.0)
MCHC: 34.3 g/dL (ref 30.0–36.0)
MCV: 91.2 fL (ref 78.0–100.0)
Monocytes Absolute: 0.3 10*3/uL (ref 0.1–1.0)
Monocytes Relative: 7 %
NEUTROS ABS: 3 10*3/uL (ref 1.7–7.7)
NEUTROS PCT: 75 %
PLATELETS: 214 10*3/uL (ref 150–400)
RBC: 3.77 MIL/uL — AB (ref 3.87–5.11)
RDW: 13.1 % (ref 11.5–15.5)
WBC: 3.9 10*3/uL — AB (ref 4.0–10.5)

## 2016-06-17 LAB — TSH: TSH: 1.2 u[IU]/mL (ref 0.350–4.500)

## 2016-06-17 MED ORDER — HEPARIN SOD (PORK) LOCK FLUSH 100 UNIT/ML IV SOLN
500.0000 [IU] | Freq: Once | INTRAVENOUS | Status: AC
  Administered 2016-06-17: 500 [IU] via INTRAVENOUS

## 2016-06-17 MED ORDER — SODIUM CHLORIDE 0.9% FLUSH
20.0000 mL | INTRAVENOUS | Status: DC | PRN
  Administered 2016-06-17: 20 mL via INTRAVENOUS
  Filled 2016-06-17: qty 20

## 2016-06-17 NOTE — Patient Instructions (Signed)
[facility] at [facility] Discharge Instructions  RECOMMENDATIONS MADE BY THE CONSULTANT AND ANY TEST RESULTS WILL BE SENT TO YOUR REFERRING PHYSICIAN.  Labs drawn thru portacath then flushed per protocol. Follow-up as scheduled. Call clinic for any questions or concerns  Thank you for choosing [facility] at [facility] to provide your oncology and hematology care.  To afford each patient quality time with our provider, please arrive at least 15 minutes before your scheduled appointment time.    If you have a lab appointment with the Cancer Center please come in thru the  Main Entrance and check in at the main information desk  You need to re-schedule your appointment should you arrive 10 or more minutes late.  We strive to give you quality time with our providers, and arriving late affects you and other patients whose appointments are after yours.  Also, if you no show three or more times for appointments you may be dismissed from the clinic at the providers discretion.     Again, thank you for choosing [facility].  Our hope is that these requests will decrease the amount of time that you wait before being seen by our physicians.       _____________________________________________________________  Should you have questions after your visit to [facility], please contact our office at [phone] between the hours of 8:30 a.m. and 4:30 p.m.  Voicemails left after 4:30 p.m. will not be returned until the following business day.  For prescription refill requests, have your pharmacy contact our office.       Resources For Cancer Patients and their Caregivers ? American Cancer Society: Can assist with transportation, wigs, general needs, runs Look Good Feel Better.        1-888-227-6333 ? Cancer Care: Provides financial assistance, online support groups, medication/co-pay assistance.  1-800-813-HOPE (4673) ? [facility] Assists [place] cancer patients and their families through emotional , educational and financial support.  336-427-4357 ? [place] DSS Where to apply for food stamps, Medicaid and utility assistance. 336-342-1394 ? RCATS: Transportation to medical appointments. 336-347-2287 ? Social Security Administration: May apply for disability if have a Stage IV cancer. 336-342-7796 1-800-772-1213 ? [place] Aging, Disability and Transit Services: Assists with nutrition, care and transit needs. 336-349-2343  Cancer Center Support Programs: @10RELATIVEDAYS@ > Cancer Support Group  2nd Tuesday of the month 1pm-2pm, Journey Room  > Creative Journey  3rd Tuesday of the month 1130am-1pm, Journey Room  > Look Good Feel Better  1st Wednesday of the month 10am-12 noon, Journey Room (Call American Cancer Society to register 1-800-395-5775)   

## 2016-06-17 NOTE — Progress Notes (Signed)
Heather Strickland tolerated portacath lab draw with flush well without complaints or incident.Port accessed with blood drawn for labs ordered then flushed with 20 ml NS and 5 ml Heparin easily per protocol. VSS Pt discharged self ambulatory in satisfactory condition accompanied by a interpreter

## 2016-06-17 NOTE — Progress Notes (Signed)
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 Benjamine Mola      01/15/2016 Initial Diagnosis    She presented to Dr. Benjamine Mola after being referred by [name], NP (GI) with a 5-6 month history of severe sore throat.  The patient never sought treatment for this issue previously.  Over the last 3 months, patient reported intermittent bleeding from left tonsil.  She also feels a hard mass along the left side of neck.  On physical exam by Dr. Benjamine Mola, he noted 3+ ulceration and erythema on the left tonsil.  On flexible laryngoscopy, tonsillar hypertrophy with ulceration was noted on the left.      01/19/2016 Pathology Results    Invasive squamous cell carcinoma, poorly differentiated.      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 Fine soft tissue details lost at these levels.  Pharyngeal mass centered in the left palatini tonsil with surround mucosal thickening as described below probably representing neoplasm.  Mucosal thickening of the left aspect of hypopharynx and pharynx with inferior most extension to the margin of the supraglottic airway without appreciable invasion into epiglottis, aryepiglottic folds, vocal cords, or paraglottic fat.  Superior most extension of mucosal thickening to the left aspect of the uvula and along the left aspect of the soft palate to the hard/soft palate junction, this region is obscured by  motion artifact.  Anteriorly there is effacement of left glossopharyngeal sulcus without appreciable tongue base invasion, this region is obscured by motion artifact.  Asymmetric enlargement of left-sided level 2 and 3 cervical lymph nodes possibly metastatic. No evidence for lymph node necrosis or extra nodal extension.      01/29/2016 Imaging    CT chest-  No evidence of metastatic disease in the chest.      02/06/2016 PET scan    1. Highly hypermetabolic left palatine tonsillar mass, maximum SUV 38.5. 2. A left station IIa lymph node measure 1.1 cm in short axis, mildly enlarged on image 38/3, but has only a borderline elevated SUV at 3.5. 3. Other imaging findings of potential clinical significance: Mild cardiomegaly. Calcified granuloma in the superior segment right lower lobe (benign). IUD satisfactorily positioned in the uterus.      02/11/2016 Procedure    Successful fluoroscopic insertion of a 20-French pull-through gastrostomy tube.      02/11/2016 Procedure    Successful placement of a right internal jugular approach power injectable Port-A-Cath. The catheter is ready for immediate use.      03/08/2016 - 04/23/2016 Radiation Therapy    Radiation alone Isidore Moos). Left Tonsil and Bilateral neck / 70 Gy in 35 fractions to gross disease, 63 Gy in 35 fractions to high risk nodal echelons, and 56 Gy in 35 fractions to intermediate risk nodal echelons        INTERVAL HISTORY:  Heather Strickland presents for routine follow-up since completing definitive radiation therapy  for left tonsil cancer.   She is scheduled to have her G-tube removed tomorrow. She is concerned that she has continued to lose some weight.  She has been instilling about 2 cans of Ensure/Boost per day in her G-tube. Reports having a hard time with swallowing, not d/t pain but due to dry mouth. She has not tried any oral saliva substitute products; she just tries to drink as much water as she can.  Appetite is about 50%; she also has dysgeusia since finishing treatment.  Reports continued fatigue; energy levels are abut 50%.  She has occasional dizziness, mostly with walking.    Of note: Spanish language interpreter, Heather Strickland, present during today's visit and physical exam.     REVIEW OF SYSTEMS:  Review of Systems  Constitutional: Positive for appetite change, fatigue and unexpected weight change. Negative for chills and fever.  HENT:   Positive for trouble swallowing.   Eyes: Negative.   Respiratory: Negative.  Negative for cough and shortness of breath.   Cardiovascular: Negative.  Negative for chest pain.  Gastrointestinal: Negative.  Negative for abdominal pain, blood in stool, constipation, diarrhea, nausea and vomiting.  Endocrine: Negative.   Genitourinary: Negative.  Negative for dysuria and hematuria.   Musculoskeletal: Negative.   Skin:       Concerned about redness at G-tube site   Neurological: Positive for dizziness and numbness. Negative for headaches.  Hematological: Negative.   Psychiatric/Behavioral: Negative.      PAST MEDICAL/SURGICAL HISTORY:  Past Medical History:  Diagnosis Date  . Anemia   . GERD (gastroesophageal reflux disease)   . History of radiation therapy 03/08/2016 - 04/23/2016   Left Tonsil and Bilateral Neck  . Squamous cell carcinoma of left tonsil ([place]) 01/27/2016   Past Surgical History:  Procedure Laterality Date  . CESAREAN SECTION  [id number]  . ESOPHAGOGASTRODUODENOSCOPY N/A 11/07/2015   Dr. Gala Romney: normal esophagus, chronic gastritis   . IR GENERIC HISTORICAL  02/11/2016   IR FLUORO GUIDE PORT INSERTION RIGHT 02/11/2016 Sandi Mariscal, MD [facility]  . IR GENERIC HISTORICAL  02/11/2016   IR US GUIDE VASC ACCESS RIGHT 02/11/2016 Sandi Mariscal, MD [facility]  . IR GENERIC HISTORICAL  02/11/2016   IR GASTROSTOMY TUBE MOD SED 02/11/2016 Sandi Mariscal, MD [facility]  . MULTIPLE EXTRACTIONS WITH ALVEOLOPLASTY N/A 02/20/2016    Procedure: Extraction of tooth #'s 15,17,18 and 32 with alveoloplasty and dental cleaning of teeth;  Surgeon: Lenn Cal, DDS;  Location: [facility];  Service: Oral Surgery;  Laterality: N/A;     SOCIAL HISTORY:  Social History   Social History  . Marital status: Married    Spouse name: N/A  . Number of children: 4  . Years of education: N/A   Occupational History  . Not on file.   Social History Main Topics  . Smoking status: Never Smoker  . Smokeless tobacco: Never Used  . Alcohol use No  . Drug use: No  . Sexual activity: Not Currently    Birth control/ protection: None, IUD   Other Topics Concern  . Not on file   Social History Narrative  . No narrative on file    FAMILY HISTORY:  Family History  Problem Relation Age of Onset  . Diabetes Mother   . Hypertension Mother   . Prostatitis Father   . ADD / ADHD Daughter   . Cerebral palsy Son     CURRENT MEDICATIONS:  Outpatient Encounter Prescriptions as of 06/17/2016  Medication Sig  .  ferrous sulfate 325 (65 FE) MG tablet Take 1 tablet (325 mg total) by mouth daily.  Marland Kitchen levonorgestrel (MIRENA) 20 MCG/24HR IUD 1 each by Intrauterine route once.  . Multiple Vitamins-Minerals (MULTIVITAMIN WITH MINERALS) tablet Take 1 tablet by mouth daily.  . pantoprazole (PROTONIX) 40 MG tablet Take 1 tablet (40 mg total) by mouth 2 (two) times daily before a meal. Tome una tableta por boca diaria  . sodium fluoride (PREVIDENT 5000 PLUS) 1.1 % CREA dental cream Apply thin ribbon of cream to tooth brush. Brush teeth for 2 minutes. Spit out excess-DO NOT swallow. DO NOT rinse afterwards. Repeat nightly.  Marland Kitchen HYDROcodone-acetaminophen (HYCET) 7.5-325 mg/15 ml solution Take 10-15 mLs by mouth every 4 (four) hours as needed for moderate pain. (Patient not taking: Reported on 05/14/2016)  . lidocaine (XYLOCAINE) 2 % solution Patient: Mix 1part 2% viscous lidocaine, 1part H20. Swish and swallow 44mL of this mixture, 72min before meals and at  bedtime, up to QID (Patient not taking: Reported on 05/14/2016)  . nystatin (MYCOSTATIN) 100000 UNIT/ML suspension Take 5 mLs (500,000 Units total) by mouth 4 (four) times daily. (Patient not taking: Reported on 06/17/2016)  . sodium fluoride (FLUORISHIELD) 1.1 % GEL dental gel Instill one drop of gel per tooth space of fluoride tray. Place over teeth for 5 minutes. Remove. Spit out excess. Repeat nightly. (Patient not taking: Reported on 05/14/2016)   No facility-administered encounter medications on file as of 06/17/2016.     ALLERGIES:  Allergies  Allergen Reactions  . No Known Allergies      PHYSICAL EXAM:  ECOG Performance status: 1 - Symptomatic; largely independent.   Vitals:   06/17/16 1407  BP: 106/75  Pulse: 97  Resp: 16   Filed Weights   06/17/16 1407  Weight: 144 lb (65.3 kg)    Historical weights reviewed from radiation oncology clinic:     Physical Exam  Constitutional: She is oriented to person, place, and time and well-developed, well-nourished, and in no distress.  HENT:  Head: Normocephalic.  Mouth/Throat: No oropharyngeal exudate.  -Mild xerostomia noted  -Posterior oropharyngeal erythema, as expected after radiation therapy.   Eyes: Conjunctivae are normal. Pupils are equal, round, and reactive to light. No scleral icterus.  Neck: Normal range of motion. Neck supple.  Skin of neck hyperpigmented as expected after radiation therapy. No moist desquamation  Cardiovascular: Normal rate.   Regularly irregular   Pulmonary/Chest: Effort normal and breath sounds normal. No respiratory distress. She has no wheezes. She has no rales.  Abdominal: Soft. Bowel sounds are normal. There is no tenderness. There is no rebound and no guarding.  -G-tube site without evidence of infection. There is slight area of redness that appears to be small outpouching of subQ tissue at 9:00 position of insertion site.   Musculoskeletal: Normal range of motion. She exhibits no edema.   Lymphadenopathy:    She has no cervical adenopathy.       Right: No supraclavicular adenopathy present.       Left: No supraclavicular adenopathy present.  Neurological: She is alert and oriented to person, place, and time. No cranial nerve deficit. Gait normal.  Skin: Skin is warm and dry. No rash noted.  Psychiatric: Mood, memory, affect and judgment normal.  Nursing note and vitals reviewed.    LABORATORY DATA:  I have reviewed the labs as listed.  CBC    Component Value Date/Time   WBC 3.9 (L) 06/17/2016 1415   RBC 3.77 (L) 06/17/2016 1415  HGB 11.8 (L) 06/17/2016 1415   HGB 11.4 (L) 03/26/2016 1531   HCT 34.4 (L) 06/17/2016 1415   HCT 33.4 (L) 03/26/2016 1531   PLT 214 06/17/2016 1415   PLT 213 03/26/2016 1531   MCV 91.2 06/17/2016 1415   MCV 90.8 03/26/2016 1531   MCH 31.3 06/17/2016 1415   MCHC 34.3 06/17/2016 1415   RDW 13.1 06/17/2016 1415   RDW 13.1 03/26/2016 1531   LYMPHSABS 0.6 (L) 06/17/2016 1415   LYMPHSABS 0.5 (L) 03/26/2016 1531   MONOABS 0.3 06/17/2016 1415   MONOABS 0.3 03/26/2016 1531   EOSABS 0.1 06/17/2016 1415   EOSABS 0.1 03/26/2016 1531   BASOSABS 0.0 06/17/2016 1415   BASOSABS 0.0 03/26/2016 1531   CMP Latest Ref Rng & Units 06/17/2016 03/26/2016 02/11/2016  Glucose 65 - 99 mg/dL 92 89 98  BUN 6 - 20 mg/dL 10 8.3 12  Creatinine 0.44 - 1.00 mg/dL 0.43(L) 0.7 0.58  Sodium 135 - 145 mmol/L 136 136 138  Potassium 3.5 - 5.1 mmol/L 3.8 4.0 3.8  Chloride 101 - 111 mmol/L 103 - 104  CO2 22 - 32 mmol/L 27 27 25   Calcium 8.9 - 10.3 mg/dL 9.5 9.4 9.4  Total Protein 6.5 - 8.1 g/dL 7.1 7.3 -  Total Bilirubin 0.3 - 1.2 mg/dL 0.8 0.71 -  Alkaline Phos 38 - 126 U/L 52 68 -  AST 15 - 41 U/L 14(L) 13 -  ALT 14 - 54 U/L 9(L) 11 -     Ref. Range 02/23/2016 14:03  TSH Latest Ref Range: 0.308 - 3.960 m(IU)/L 0.360    PENDING LABS:    DIAGNOSTIC IMAGING:  *The following radiologic images and reports have been reviewed independently and agree with below  findings.  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 Treated with radiation therapy alone by Dr. Isidore Moos; completed treatment on 04/23/16.  -Continuing to recover from radiation. No clinical signs of recurrence on exam today.  -Per Dr. Pearlie Oyster last note, she will order restaging imaging and see the patient back in her clinic in 07/2016. Ms. Petrasek will also need to re-connect with her ENT physician, Dr. Benjamine Mola, sometime after restaging imaging as well for continued surveillance.  -NCCN Guidelines removed.  Return to cancer center in 1 month for continued follow-up.       Dysphagia/Xerostomia:  -Secondary to continued healing from radiation therapy and associated xerostomia.  -Denies pain with swallowing or sore throat. Her only difficulty with swallowing she associates with dry mouth/throat.   -Recommended she try Biotene products (mouth spray, mouthwash, or oral gel). I provided the name of these products to her in writing today for her to pick up OTC from any pharmacy.   Nutrition/Weight loss:  -Weight is down ~6 lbs since 05/25/16. She has been using her G-tube daily with about 2 Ensure/Boost per day. Also eats by mouth, but struggles with dysgeusia and decreased appetite.  -Given her continued weight loss, I will cancel her G-tube removal (scheduled for tomorrow).  -Encouraged her to continue to try different foods by mouth and supplement with nutritional supplements as often as needed. She understands that we will not reschedule her G-tube removal procedure until her weight is stable on oral intake alone.  She will return to cancer center in 1 month to re-assess her  weight. If weight is stable, then will consider placing orders to have her PEG removed at that time.  -Intermittent dizziness. Could be secondary to inadequate fluid intake;  encouraged her to force fluids as tolerated.   At risk for hypothyroidism:  -Discussed associated risk of developing thyroid dysfunction as a possible side effect of radiation therapy for H&N patients.  This risk is lifelong.  -Will check TSH today; previous baseline TSH normal at 0.360 in 02/2016.  -We will continue to periodically check TSH (every 6-12 months). TSH collected today and results are pending; we will contact her with the results.    *Note: When needing to communicate with patient via phone, we are asked to contact her daughter-in-law Heather Strickland (who is bilingual) at: [phone]    Dispo:  -Cancel PEG tube removal scheduled for tomorrow given weight loss.  -Return to cancer center in 1 month for continued follow-up/supportive care.    All questions were answered to patient's stated satisfaction. Encouraged patient to call with any new concerns or questions before her next visit to the cancer center and we can certain see her sooner, if needed.    Plan of care discussed with Dr. Talbert Cage, who agrees with the above aforementioned.    A total of 30 minutes was spent in face-to-face care of this patient, with greater than 50% of that time spent in counseling and care-coordination.    Orders placed this encounter:  Orders Placed This Encounter  Procedures  . CBC with Differential/Platelet  . Comprehensive metabolic panel  . TSH      Mike Craze, NP [place] [phone]

## 2016-06-17 NOTE — Patient Instructions (Addendum)
[place] at [facility] Discharge Instructions  RECOMMENDATIONS MADE BY THE CONSULTANT AND ANY TEST RESULTS WILL BE SENT TO YOUR REFERRING PHYSICIAN.  You were seen today by Mike Craze NP. We will not be doing the PEG tube removal tomorrow. We will see you again in one month  Thank you for choosing [place] at [facility] to provide your oncology and hematology care.  To afford each patient quality time with our provider, please arrive at least 15 minutes before your scheduled appointment time.    If you have a lab appointment with the [facility] please come in thru the  Main Entrance and check in at the main information desk  You need to re-schedule your appointment should you arrive 10 or more minutes late.  We strive to give you quality time with our providers, and arriving late affects you and other patients whose appointments are after yours.  Also, if you no show three or more times for appointments you may be dismissed from the clinic at the providers discretion.     Again, thank you for choosing [facility].  Our hope is that these requests will decrease the amount of time that you wait before being seen by our physicians.       _____________________________________________________________  Should you have questions after your visit to [facility], please contact our office at [phone] between the hours of 8:30 a.m. and 4:30 p.m.  Voicemails left after 4:30 p.m. will not be returned until the following business day.  For prescription refill requests, have your pharmacy contact our office.       Resources For Cancer Patients and their Caregivers ? American Cancer Society: Can assist with transportation, wigs, general needs, runs Look Good Feel Better.        319 454 6115 ? Cancer Care: Provides financial assistance, online support groups, medication/co-pay assistance.  1-800-813-HOPE  806-644-7740) ? [facility] Assists [place] cancer patients and their families through emotional , educational and financial support.  762-004-7916 ? [place] DSS Where to apply for food stamps, Medicaid and utility assistance. 414-260-3857 ? RCATS: Transportation to medical appointments. 640-355-2602 ? Social Security Administration: May apply for disability if have a Stage IV cancer. 604-728-5199 507-681-1232 ? [employer], Disability and Transit Services: Assists with nutrition, care and transit needs. [place] Support Programs: @10RELATIVEDAYS @ > Cancer Support Group  2nd Tuesday of the month 1pm-2pm, Journey Room  > Creative Journey  3rd Tuesday of the month 1130am-1pm, Journey Room  > Look Good Feel Better  1st Wednesday of the month 10am-12 noon, Journey Room (Call [place] to register [phone])

## 2016-06-18 ENCOUNTER — Telehealth: Payer: Self-pay | Admitting: *Deleted

## 2016-06-18 ENCOUNTER — Other Ambulatory Visit (HOSPITAL_COMMUNITY): Payer: Self-pay

## 2016-06-18 NOTE — Telephone Encounter (Signed)
Patient to have Pet Scan on 06-25-16 @ 7 am @ [facility]

## 2016-06-25 ENCOUNTER — Ambulatory Visit (HOSPITAL_COMMUNITY): Payer: Self-pay

## 2016-07-28 NOTE — Progress Notes (Signed)
error 

## 2016-07-30 ENCOUNTER — Encounter (HOSPITAL_COMMUNITY): Payer: Self-pay | Attending: Oncology | Admitting: Adult Health

## 2016-07-30 ENCOUNTER — Encounter (HOSPITAL_COMMUNITY): Payer: Self-pay | Attending: Adult Health

## 2016-07-30 ENCOUNTER — Encounter (HOSPITAL_COMMUNITY): Payer: Self-pay | Admitting: Adult Health

## 2016-07-30 VITALS — BP 107/66 | HR 71 | Temp 98.6°F | Resp 16 | Wt 140.5 lb

## 2016-07-30 DIAGNOSIS — C090 Malignant neoplasm of tonsillar fossa: Secondary | ICD-10-CM | POA: Insufficient documentation

## 2016-07-30 DIAGNOSIS — R634 Abnormal weight loss: Secondary | ICD-10-CM

## 2016-07-30 DIAGNOSIS — C099 Malignant neoplasm of tonsil, unspecified: Secondary | ICD-10-CM

## 2016-07-30 NOTE — Progress Notes (Signed)
Nutrition Follow-up:  Patient identified on Malnutrition Screening Report for weight loss.    Seen in clinic today following MD appointment with interpreter present.  Patient with stage I SCC of left tonsil HPV+ completed radiation on 04/23/16.  History of GERD  Patient has G-tube in place for nutrition.  Has been giving ensure (not sure if original or plus) via tube 2 - 3 times per day.  She is not using osmolite at this time. Patient reports no problems swallowing. Does report taste changes and dry mouth.  Drinking water during visit and has been using biotene.     Patient reports that she has been eating about 2 meals per day of soup or chicken broth or meat and vegetables and fried rice.  May also have an egg.    Medications: fe sulfate, MVI  Labs: reviewed  Anthropometrics:   Weight decreased to 140 lb today from 157.2 pounds in March.     Estimated Energy Needs  Kcals: 2132-2485 calories/d Protein: 92-107 g/d Fluid: 2.5 L/d  NUTRITION DIAGNOSIS: Unintentional weight loss continues   INTERVENTION:   Discussed meals and foods patient ate prior to diagnosis.  Encouraged her to start preparing those types of foods again.  Encouraged her to really start to eat at least 3 meals per day consisting of foods that are high in protein and calories to try (examples provided) Encouraged patient to try consuming ensure plus, boost plus with at least 350-360 calories orally 2-3 times per day between meals to improve nutrition and weight.   Discussed goal of patient to obtain all of nutrition and hydration orally vs via tube so tube can be removed.  Patient verbalized understanding and will work to increase oral intake.   Patient will need to flush feeding tube with 97ml of water if not in use.    MONITORING, EVALUATION, GOAL: Patient will obtain most of nutrition from oral intake with goal of maintaining weight/gaining before tube can be removed.   NEXT VISIT: Aug 31  [name] B. Zenia Heather Strickland, [place],  [place] Registered Dietitian [phone] (pager)

## 2016-07-30 NOTE — Patient Instructions (Signed)
[facility] Discharge Instructions  RECOMMENDATIONS MADE BY THE CONSULTANT AND ANY TEST RESULTS WILL BE SENT TO YOUR REFERRING PHYSICIAN.  You saw Gretchen Dawson, NP today.  Thank you for choosing [facility] at [facility] to provide your oncology and hematology care.  To afford each patient quality time with our provider, please arrive at least 15 minutes before your scheduled appointment time.    If you have a lab appointment with the Cancer Center please come in thru the  Main Entrance and check in at the main information desk  You need to re-schedule your appointment should you arrive 10 or more minutes late.  We strive to give you quality time with our providers, and arriving late affects you and other patients whose appointments are after yours.  Also, if you no show three or more times for appointments you may be dismissed from the clinic at the providers discretion.     Again, thank you for choosing [facility].  Our hope is that these requests will decrease the amount of time that you wait before being seen by our physicians.       _____________________________________________________________  Should you have questions after your visit to [facility], please contact our office at [phone] between the hours of 8:30 a.m. and 4:30 p.m.  Voicemails left after 4:30 p.m. will not be returned until the following business day.  For prescription refill requests, have your pharmacy contact our office.       Resources For Cancer Patients and their Caregivers ? American Cancer Society: Can assist with transportation, wigs, general needs, runs Look Good Feel Better.        1-888-227-6333 ? Cancer Care: Provides financial assistance, online support groups, medication/co-pay assistance.  1-800-813-HOPE (4673) ? [facility] Assists [place] cancer patients and their families  through emotional , educational and financial support.  336-427-4357 ? [place] DSS Where to apply for food stamps, Medicaid and utility assistance. 336-342-1394 ? [facility]: Transportation to medical appointments. 336-347-2287 ? Social Security Administration: May apply for disability if have a Stage IV cancer. 336-342-7796 1-800-772-1213 ? [place] Aging, Disability and Transit Services: Assists with nutrition, care and transit needs. 336-349-2343  Cancer Center Support Programs: @10RELATIVEDAYS@ > Cancer Support Group  2nd Tuesday of the month 1pm-2pm, Journey Room  > Creative Journey  3rd Tuesday of the month 1130am-1pm, Journey Room  > Look Good Feel Better  1st Wednesday of the month 10am-12 noon, Journey Room (Call American Cancer Society to register 1-800-395-5775)   

## 2016-07-30 NOTE — Progress Notes (Signed)
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 Benjamine Mola      01/15/2016 Initial Diagnosis    She presented to Dr. Benjamine Mola after being referred by [name], NP (GI) with a 5-6 month history of severe sore throat.  The patient never sought treatment for this issue previously.  Over the last 3 months, patient reported intermittent bleeding from left tonsil.  She also feels a hard mass along the left side of neck.  On physical exam by Dr. Benjamine Mola, he noted 3+ ulceration and erythema on the left tonsil.  On flexible laryngoscopy, tonsillar hypertrophy with ulceration was noted on the left.      01/19/2016 Pathology Results    Invasive squamous cell carcinoma, poorly differentiated.      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 Fine soft tissue details lost at these levels.  Pharyngeal mass centered in the left palatini tonsil with surround mucosal thickening as described below probably representing neoplasm.  Mucosal thickening of the left aspect of hypopharynx and pharynx with inferior most extension to the margin of the supraglottic airway without appreciable invasion into epiglottis, aryepiglottic folds, vocal cords, or paraglottic fat.  Superior most extension of mucosal thickening to the left aspect of the uvula and along the left aspect of the soft palate to the hard/soft palate junction, this region is obscured by  motion artifact.  Anteriorly there is effacement of left glossopharyngeal sulcus without appreciable tongue base invasion, this region is obscured by motion artifact.  Asymmetric enlargement of left-sided level 2 and 3 cervical lymph nodes possibly metastatic. No evidence for lymph node necrosis or extra nodal extension.      01/29/2016 Imaging    CT chest-  No evidence of metastatic disease in the chest.      02/06/2016 PET scan    1. Highly hypermetabolic left palatine tonsillar mass, maximum SUV 38.5. 2. A left station IIa lymph node measure 1.1 cm in short axis, mildly enlarged on image 38/3, but has only a borderline elevated SUV at 3.5. 3. Other imaging findings of potential clinical significance: Mild cardiomegaly. Calcified granuloma in the superior segment right lower lobe (benign). IUD satisfactorily positioned in the uterus.      02/11/2016 Procedure    Successful fluoroscopic insertion of a 20-French pull-through gastrostomy tube.      02/11/2016 Procedure    Successful placement of a right internal jugular approach power injectable Port-A-Cath. The catheter is ready for immediate use.      03/08/2016 - 04/23/2016 Radiation Therapy    Radiation alone Isidore Moos). Left Tonsil and Bilateral neck / 70 Gy in 35 fractions to gross disease, 63 Gy in 35 fractions to high risk nodal echelons, and 56 Gy in 35 fractions to intermediate risk nodal echelons        INTERVAL HISTORY:  [name] presents for routine follow-up since completing definitive radiation therapy  for left tonsil cancer.   Overall, she tells me she has been feeling pretty well. Her appetite and energy levels are still about 50%. She continues to have some fatigue. She has been able to tolerate a variety of foods by mouth without difficulty. She has trouble with spicy foods, as they burn her throat since radiation.  Continues to use her G-tube; instills 2-3 cans/day generally.  Weight is down about  4 more lbs since her last visit in early 06/2016.  (down ~10 lbs in 2 months).  She feels like she is drinking plenty of water.  The Biotene products are helping her dry mouth since finishing radiation.   Bowels are moving well. Denies any pain. Does report new "flashing light in my eyes" occasionally. She first noticed this about 3 weeks ago. Occurs 1-2 times per day. Denies any associated headaches, dizziness, or diplopia. She has mild blurry vision with reading, but this may be chronic.  Does endorse some posterior neck pain/stiffness. Denies any other neurologic complaints.    Of note: Spanish language interpreter, Windle Guard, present during today's visit and physical exam.     REVIEW OF SYSTEMS:  Review of Systems  Constitutional: Positive for fatigue.  HENT:   Negative for sore throat.        Dry mouth  Eyes: Positive for eye problems.  Respiratory: Negative.  Negative for cough and shortness of breath.   Cardiovascular: Negative.  Negative for chest pain and leg swelling.  Gastrointestinal: Negative.  Negative for abdominal pain, blood in stool, constipation, diarrhea, nausea and vomiting.  Endocrine: Negative.   Genitourinary: Negative for dysuria and hematuria.   Musculoskeletal: Positive for neck pain. Negative for gait problem.  Neurological: Negative.  Negative for dizziness, extremity weakness, gait problem, headaches, light-headedness and speech difficulty.  Hematological: Negative.   Psychiatric/Behavioral: Negative.      PAST MEDICAL/SURGICAL HISTORY:  Past Medical History:  Diagnosis Date  . Anemia   . GERD (gastroesophageal reflux disease)   . History of radiation therapy 03/08/2016 - 04/23/2016   Left Tonsil and Bilateral Neck  . Squamous cell carcinoma of left tonsil ([place]) 01/27/2016   Past Surgical History:  Procedure Laterality Date  . CESAREAN SECTION  [id number]  . ESOPHAGOGASTRODUODENOSCOPY N/A 11/07/2015   Dr. Gala Romney: normal esophagus, chronic gastritis    . IR GENERIC HISTORICAL  02/11/2016   IR FLUORO GUIDE PORT INSERTION RIGHT 02/11/2016 Sandi Mariscal, MD [facility]INTER[facility]  . IR GENERIC HISTORICAL  02/11/2016   IR US GUIDE VASC ACCESS RIGHT 02/11/2016 Sandi Mariscal, MD [facility]  . IR GENERIC HISTORICAL  02/11/2016   IR GASTROSTOMY TUBE MOD SED 02/11/2016 Sandi Mariscal, MD [facility]  . MULTIPLE EXTRACTIONS WITH ALVEOLOPLASTY N/A 02/20/2016   Procedure: Extraction of tooth #'s 15,17,18 and 32 with alveoloplasty and dental cleaning of teeth;  Surgeon: Lenn Cal, DDS;  Location: [place];  Service: Oral Surgery;  Laterality: N/A;     SOCIAL HISTORY:  Social History   Social History  . Marital status: Married    Spouse name: N/A  . Number of children: 4  . Years of education: N/A   Occupational History  . Not on file.   Social History Main Topics  . Smoking status: Never Smoker  . Smokeless tobacco: Never Used  . Alcohol use No  . Drug use: No  . Sexual activity: Not Currently    Birth control/ protection: None, IUD   Other Topics Concern  . Not on file  Social History Narrative  . No narrative on file    FAMILY HISTORY:  Family History  Problem Relation Age of Onset  . Diabetes Mother   . Hypertension Mother   . Prostatitis Father   . ADD / ADHD Daughter   . Cerebral palsy Son     CURRENT MEDICATIONS:  Outpatient Encounter Prescriptions as of 07/30/2016  Medication Sig  . ferrous sulfate 325 (65 FE) MG tablet Take 1 tablet (325 mg total) by mouth daily.  Marland Kitchen HYDROcodone-acetaminophen (HYCET) 7.5-325 mg/15 ml solution Take 10-15 mLs by mouth every 4 (four) hours as needed for moderate pain.  Marland Kitchen levonorgestrel (MIRENA) 20 MCG/24HR IUD 1 each by Intrauterine route once.  . lidocaine (XYLOCAINE) 2 % solution Patient: Mix 1part 2% viscous lidocaine, 1part H20. Swish and swallow 17mL of this mixture, 39min before meals and at bedtime, up to QID  . Multiple Vitamins-Minerals (MULTIVITAMIN WITH MINERALS) tablet Take 1 tablet by  mouth daily.  Marland Kitchen nystatin (MYCOSTATIN) 100000 UNIT/ML suspension Take 5 mLs (500,000 Units total) by mouth 4 (four) times daily.  . pantoprazole (PROTONIX) 40 MG tablet Take 1 tablet (40 mg total) by mouth 2 (two) times daily before a meal. Tome una tableta por boca diaria  . sodium fluoride (FLUORISHIELD) 1.1 % GEL dental gel Instill one drop of gel per tooth space of fluoride tray. Place over teeth for 5 minutes. Remove. Spit out excess. Repeat nightly.  . sodium fluoride (PREVIDENT 5000 PLUS) 1.1 % CREA dental cream Apply thin ribbon of cream to tooth brush. Brush teeth for 2 minutes. Spit out excess-DO NOT swallow. DO NOT rinse afterwards. Repeat nightly.   No facility-administered encounter medications on file as of 07/30/2016.     ALLERGIES:  Allergies  Allergen Reactions  . No Known Allergies      PHYSICAL EXAM:  ECOG Performance status: 1 - Symptomatic; largely independent.   Vitals:   07/30/16 1305  BP: 107/66  Pulse: 71  Resp: 16  Temp: 98.6 F (37 C)   Filed Weights   07/30/16 1305  Weight: 140 lb 8 oz (63.7 kg)    Historical weights reviewed from radiation oncology clinic:     Physical Exam  Constitutional: She is oriented to person, place, and time and well-developed, well-nourished, and in no distress.  HENT:  Head: Normocephalic.  Mouth/Throat: Oropharynx is clear and moist.  Xerostomia -Mild posterior oropharyngeal erythema (as expected s/p radiation treatment)   Eyes: Pupils are equal, round, and reactive to light. Conjunctivae are normal. No scleral icterus.  Neck: Normal range of motion. Neck supple.  Mild hyperpigmentation to anterior neck  Cardiovascular: Normal rate and regular rhythm.   Pulmonary/Chest: Effort normal and breath sounds normal. No respiratory distress. She has no wheezes. She has no rales.  Abdominal: Soft. Bowel sounds are normal. She exhibits no distension. There is no tenderness. There is no rebound.  G-tube in place   Musculoskeletal: Normal range of motion. She exhibits no edema.  Lymphadenopathy:    She has no cervical adenopathy.  Neurological: She is alert and oriented to person, place, and time. She has normal sensation, normal strength and intact cranial nerves. No cranial nerve deficit. Gait normal.  Skin: Skin is warm and dry. No rash noted.  Psychiatric: Mood, memory, affect and judgment normal.  Nursing note and vitals reviewed.    LABORATORY DATA:  I have reviewed the labs as listed.  CBC    Component Value Date/Time   WBC 3.9 (L) 06/17/2016 1415  RBC 3.77 (L) 06/17/2016 1415   HGB 11.8 (L) 06/17/2016 1415   HGB 11.4 (L) 03/26/2016 1531   HCT 34.4 (L) 06/17/2016 1415   HCT 33.4 (L) 03/26/2016 1531   PLT 214 06/17/2016 1415   PLT 213 03/26/2016 1531   MCV 91.2 06/17/2016 1415   MCV 90.8 03/26/2016 1531   MCH 31.3 06/17/2016 1415   MCHC 34.3 06/17/2016 1415   RDW 13.1 06/17/2016 1415   RDW 13.1 03/26/2016 1531   LYMPHSABS 0.6 (L) 06/17/2016 1415   LYMPHSABS 0.5 (L) 03/26/2016 1531   MONOABS 0.3 06/17/2016 1415   MONOABS 0.3 03/26/2016 1531   EOSABS 0.1 06/17/2016 1415   EOSABS 0.1 03/26/2016 1531   BASOSABS 0.0 06/17/2016 1415   BASOSABS 0.0 03/26/2016 1531   CMP Latest Ref Rng & Units 06/17/2016 03/26/2016 02/11/2016  Glucose 65 - 99 mg/dL 92 89 98  BUN 6 - 20 mg/dL 10 8.3 12  Creatinine 0.44 - 1.00 mg/dL 0.43(L) 0.7 0.58  Sodium 135 - 145 mmol/L 136 136 138  Potassium 3.5 - 5.1 mmol/L 3.8 4.0 3.8  Chloride 101 - 111 mmol/L 103 - 104  CO2 22 - 32 mmol/L 27 27 25   Calcium 8.9 - 10.3 mg/dL 9.5 9.4 9.4  Total Protein 6.5 - 8.1 g/dL 7.1 7.3 -  Total Bilirubin 0.3 - 1.2 mg/dL 0.8 0.71 -  Alkaline Phos 38 - 126 U/L 52 68 -  AST 15 - 41 U/L 14(L) 13 -  ALT 14 - 54 U/L 9(L) 11 -     Ref. Range 02/23/2016 14:03  TSH Latest Ref Range: 0.308 - 3.960 m(IU)/L 0.360    PENDING LABS:    DIAGNOSTIC IMAGING:  *The following radiologic images and reports have been reviewed  independently and agree with below findings.  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 Treated with radiation therapy alone by Dr. Isidore Moos; completed treatment on 04/23/16.  -Continuing to recover from radiation. No clinical signs of recurrence on exam today.  -Restaging PET scan scheduled for 08/05/16; she will follow-up with Dr. Isidore Moos with radiation oncology the following day to review results.  -Will have her return to cancer center in about 6 weeks to follow-up on continued weight loss and recovery from radiation.        Vision changes:  -Unclear etiology for the "flashing lights" she is experiencing in her peripheral vision. Could be migraine, but no headache makes this less likely. Full neurologic exam performed today and is normal. Could be secondary to dehydration and continued recovery from radiation. I have low clinical suspicion that these symptoms are secondary to her tonsil cancer; not likely to be brain metastasis given Stage I disease. Could also be d/t posterior neck tension/muscular-related; encouraged her to try massage to her neck to see if symptoms improve.  -Will keep monitoring her symptoms and my order diagnostic imaging in the future if complaints don't resolve.   Xerostomia:  -Improving with Biotene products and drinking plenty of water.   Nutrition/Weight loss:  -Weight is down ~10 lbs in the past 2 months. -PEG still in place. She is instilling 2-3 cans of Ensure/Boost per day. Able to tolerate many foods by mouth as well, with the exception of spicy foods which causes burning.  -Discussed with her the importance of  adequate protein and caloric intake in the setting of recovering from cancer treatments. Encouraged her to increase caloric intake and/or number of cans of tube feedings/day.    -Dietitian, Jennet Maduro,  RD to see the patient today as well. Please see her documentation for additional information.  -Will bring her back in 6 weeks for weight check and follow-up visit. Ms. Tapscott understands that in order for her PEG tube to be removed, she needs to be able to maintain her weight with oral intake alone.    At risk for hypothyroidism:  -She understands that she is at increased risk of hypothyroidism given radiation therapy to neck.  TSH has remained normal thus far; last TSH 1.200 on 06/17/16.    -Will continue to periodically check TSH at subsequent follow-up visits (at least every 6-12 months).    *Note: When needing to communicate with patient via phone, we are asked to contact her daughter-in-law Joellen Jersey (who is bilingual) at: [phone]    Dispo:  -Maintain scheduled PET scan and follow-up with Dr. Isidore Moos as scheduled.  -Return to cancer center in 6 weeks for continued follow-up.    All questions were answered to patient's stated satisfaction. Encouraged patient to call with any new concerns or questions before her next visit to the cancer center and we can certain see her sooner, if needed.    Plan of care discussed with Dr. Talbert Cage, who agrees with the above aforementioned.      Orders placed this encounter:  No orders of the defined types were placed in this encounter.     Mike Craze, NP [place] [phone]

## 2016-08-05 ENCOUNTER — Ambulatory Visit (HOSPITAL_COMMUNITY): Payer: Self-pay

## 2016-08-06 ENCOUNTER — Ambulatory Visit
Admission: RE | Admit: 2016-08-06 | Discharge: 2016-08-06 | Disposition: A | Discharge status: Home / Self Care | Payer: Self-pay | Source: Ambulatory Visit | Attending: Radiation Oncology | Admitting: Radiation Oncology

## 2016-08-06 DIAGNOSIS — C140 Malignant neoplasm of pharynx, unspecified: Secondary | ICD-10-CM | POA: Insufficient documentation

## 2016-08-06 DIAGNOSIS — Z975 Presence of (intrauterine) contraceptive device: Secondary | ICD-10-CM | POA: Insufficient documentation

## 2016-08-06 DIAGNOSIS — A630 Anogenital (venereal) warts: Secondary | ICD-10-CM | POA: Insufficient documentation

## 2016-08-06 DIAGNOSIS — B977 Papillomavirus as the cause of diseases classified elsewhere: Secondary | ICD-10-CM | POA: Insufficient documentation

## 2016-08-06 DIAGNOSIS — J320 Chronic maxillary sinusitis: Secondary | ICD-10-CM | POA: Insufficient documentation

## 2016-08-06 DIAGNOSIS — C090 Malignant neoplasm of tonsillar fossa: Secondary | ICD-10-CM | POA: Insufficient documentation

## 2016-08-06 DIAGNOSIS — R599 Enlarged lymph nodes, unspecified: Secondary | ICD-10-CM | POA: Insufficient documentation

## 2016-08-06 DIAGNOSIS — Z931 Gastrostomy status: Secondary | ICD-10-CM | POA: Insufficient documentation

## 2016-08-06 DIAGNOSIS — N83202 Unspecified ovarian cyst, left side: Secondary | ICD-10-CM | POA: Insufficient documentation

## 2016-08-12 NOTE — Progress Notes (Signed)
Heather Strickland presents for follow up of radiation completed 04/23/16 to her Left Tonsil and bilateral neck.   Pain issues, if any: She reports generalized aches to her bones. She also has pain to her throat when she swallows.  Using a feeding tube?: Yes, She is instilling 4-5 ensure daily.  Weight changes, if any:  Wt Readings from Last 3 Encounters:  08/20/16 141 lb 12.8 oz (64.3 kg)  08/17/16 141 lb 4 oz (64.1 kg)  07/30/16 140 lb 8 oz (63.7 kg)   Swallowing issues, if any: She has trouble swallowing because of decreased saliva. She uses water to help moisten her foods to swallow better. She is eating about 2 small meals daily.  Smoking or chewing tobacco? No Using fluoride trays daily? They are prescribed but she has never used or picked up from her pharmacy.  Last ENT visit was on: Not since diagnosis.  Other notable issues, if any:  She reports blurry vision in the morning when she wakes up. She will occasionally see dots during the daytime.  She has taste changes with effect her eating Mike Craze NP last on 07/30/16 and again on 09/10/16  08/17/16 PET  BP 110/76   Pulse 90   Temp 98.4 F (36.9 C)   Ht 5' 1.75" (1.568 m)   Wt 141 lb 12.8 oz (64.3 kg)   SpO2 99% Comment: room air  BMI 26.15 kg/m

## 2016-08-17 ENCOUNTER — Ambulatory Visit (HOSPITAL_COMMUNITY)
Admission: RE | Admit: 2016-08-17 | Discharge: 2016-08-17 | Disposition: A | Discharge status: Home / Self Care | Payer: Self-pay | Source: Ambulatory Visit | Attending: Radiation Oncology | Admitting: Radiation Oncology

## 2016-08-17 ENCOUNTER — Encounter: Payer: Self-pay | Admitting: Physician Assistant

## 2016-08-17 ENCOUNTER — Ambulatory Visit: Payer: Self-pay | Admitting: Physician Assistant

## 2016-08-17 VITALS — BP 110/74 | HR 71 | Temp 98.1°F | Ht 61.75 in | Wt 141.2 lb

## 2016-08-17 DIAGNOSIS — C090 Malignant neoplasm of tonsillar fossa: Secondary | ICD-10-CM

## 2016-08-17 DIAGNOSIS — Z1239 Encounter for other screening for malignant neoplasm of breast: Secondary | ICD-10-CM

## 2016-08-17 DIAGNOSIS — N9489 Other specified conditions associated with female genital organs and menstrual cycle: Secondary | ICD-10-CM | POA: Insufficient documentation

## 2016-08-17 DIAGNOSIS — J320 Chronic maxillary sinusitis: Secondary | ICD-10-CM | POA: Insufficient documentation

## 2016-08-17 LAB — GLUCOSE, CAPILLARY: Glucose-Capillary: 92 mg/dL (ref 65–99)

## 2016-08-17 MED ORDER — FLUDEOXYGLUCOSE F - 18 (FDG) INJECTION
6.8000 | Freq: Once | INTRAVENOUS | Status: AC | PRN
  Administered 2016-08-17: 6.8 via INTRAVENOUS

## 2016-08-17 NOTE — Progress Notes (Signed)
BP 110/74 (BP Location: Left Arm, Patient Position: Sitting, Cuff Size: Normal)   Pulse 71   Temp 98.1 F (36.7 C)   Ht 5' 1.75" (1.568 m)   Wt 141 lb 4 oz (64.1 kg)   SpO2 98%   BMI 26.04 kg/m    Subjective:    Patient ID: Heather Strickland, female    DOB: 09-Mar-1975, 41 y.o.   MRN: [id number]  HPI: Heather Strickland is a 41 y.o. female presenting on 08/17/2016 for New Patient (Initial Visit) (returning patient last seen. pt was last seen 10/2015. pt is being seen by oncologist.)   HPI   Pt Last seen here 10/29/2015  Pt says she got XRT for her tonsillar cancer but no chemotherapy.    Pt says mirena has been in for 3 years.  She is due for pap she says.   Pt still has her port-a-cath and she has a feeding tube.   Relevant past medical, surgical, family and social history reviewed and updated as indicated. Interim medical history since our last visit reviewed. Allergies and medications reviewed and updated.   Current Outpatient Prescriptions:  .  ferrous sulfate 325 (65 FE) MG tablet, Take 1 tablet (325 mg total) by mouth daily., Disp: 30 tablet, Rfl: 0 .  levonorgestrel (MIRENA) 20 MCG/24HR IUD, 1 each by Intrauterine route once., Disp: , Rfl:  .  Multiple Vitamins-Minerals (MULTIVITAMIN WITH MINERALS) tablet, Take 1 tablet by mouth daily., Disp: , Rfl:  .  pantoprazole (PROTONIX) 40 MG tablet, Take 1 tablet (40 mg total) by mouth 2 (two) times daily before a meal. Tome una tableta por boca diaria, Disp: 60 tablet, Rfl: 3 .  sodium fluoride (FLUORISHIELD) 1.1 % GEL dental gel, Instill one drop of gel per tooth space of fluoride tray. Place over teeth for 5 minutes. Remove. Spit out excess. Repeat nightly., Disp: 120 mL, Rfl: PRN .  sodium fluoride (PREVIDENT 5000 PLUS) 1.1 % CREA dental cream, Apply thin ribbon of cream to tooth brush. Brush teeth for 2 minutes. Spit out excess-DO NOT swallow. DO NOT rinse afterwards. Repeat nightly., Disp: 1 Tube, Rfl: prn  Review  of Systems  Constitutional: Positive for appetite change and fatigue. Negative for chills, diaphoresis, fever and unexpected weight change.  HENT: Positive for dental problem and voice change. Negative for congestion, drooling, ear pain, facial swelling, hearing loss, mouth sores, sneezing, sore throat and trouble swallowing.   Eyes: Positive for visual disturbance. Negative for pain, discharge, redness and itching.  Respiratory: Negative for cough, choking, shortness of breath and wheezing.   Cardiovascular: Negative for chest pain, palpitations and leg swelling.  Gastrointestinal: Negative for abdominal pain, blood in stool, constipation, diarrhea and vomiting.  Endocrine: Positive for cold intolerance. Negative for heat intolerance and polydipsia.  Genitourinary: Negative for decreased urine volume, dysuria and hematuria.  Musculoskeletal: Negative for arthralgias, back pain and gait problem.  Skin: Negative for rash.  Allergic/Immunologic: Negative for environmental allergies.  Neurological: Negative for seizures, syncope, light-headedness and headaches.  Hematological: Negative for adenopathy.  Psychiatric/Behavioral: Negative for agitation, dysphoric mood and suicidal ideas. The patient is not nervous/anxious.     Per HPI unless specifically indicated above     Objective:    BP 110/74 (BP Location: Left Arm, Patient Position: Sitting, Cuff Size: Normal)   Pulse 71   Temp 98.1 F (36.7 C)   Ht 5' 1.75" (1.568 m)   Wt 141 lb 4 oz (64.1 kg)   SpO2 98%   BMI 26.04  kg/m   Wt Readings from Last 3 Encounters:  08/17/16 141 lb 4 oz (64.1 kg)  07/30/16 140 lb 8 oz (63.7 kg)  06/17/16 144 lb (65.3 kg)    Physical Exam  Constitutional: She is oriented to person, place, and time. She appears well-developed and well-nourished.  HENT:  Head: Normocephalic and atraumatic.  Neck: Neck supple.  Cardiovascular: Normal rate and regular rhythm.   Pulmonary/Chest: Effort normal and breath  sounds normal.  Abdominal: Soft. Bowel sounds are normal. She exhibits no mass. There is no hepatosplenomegaly. There is no tenderness.  Feeding tube in place without signs infections  Musculoskeletal: She exhibits no edema.  Lymphadenopathy:    She has no cervical adenopathy.  Neurological: She is alert and oriented to person, place, and time.  Skin: Skin is warm and dry.  Psychiatric: She has a normal mood and affect. Her behavior is normal.  Vitals reviewed.   Results for orders placed or performed during the hospital encounter of 08/17/16  Glucose, capillary  Result Value Ref Range   Glucose-Capillary 92 65 - 99 mg/dL      Assessment & Plan:   Encounter Diagnoses  Name Primary?  . Screening for breast cancer Yes  . Cancer of tonsillar fossa ([place])     -will order screening Mammogram --Pt to continue with oncology -no labs needed at this time -Pt to f/u here in 6 wk for PAP. RTO sooner prn

## 2016-08-20 ENCOUNTER — Encounter: Payer: Self-pay | Admitting: Radiation Oncology

## 2016-08-20 ENCOUNTER — Ambulatory Visit
Admission: RE | Admit: 2016-08-20 | Discharge: 2016-08-20 | Disposition: A | Discharge status: Home / Self Care | Payer: Self-pay | Source: Ambulatory Visit | Attending: Radiation Oncology | Admitting: Radiation Oncology

## 2016-08-20 VITALS — BP 110/76 | HR 90 | Temp 98.4°F | Ht 61.75 in | Wt 141.8 lb

## 2016-08-20 DIAGNOSIS — B977 Papillomavirus as the cause of diseases classified elsewhere: Secondary | ICD-10-CM

## 2016-08-20 DIAGNOSIS — C090 Malignant neoplasm of tonsillar fossa: Secondary | ICD-10-CM

## 2016-08-20 DIAGNOSIS — N83202 Unspecified ovarian cyst, left side: Secondary | ICD-10-CM

## 2016-08-20 NOTE — Progress Notes (Signed)
Radiation Oncology         [phone]) [phone] ________________________________  Name: Heather Strickland MRN: [id number]  Date: 08/20/2016  DOB: 10-24-1975  Follow-Up Visit Note  CC: Soyla Dryer, PA-C  [place], Kelby Fam, MD  Diagnosis and Prior Radiotherapy:       ICD-10-CM   1. 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 Cyst of left ovary N83.202 Ambulatory referral to Gynecology    CHIEF COMPLAINT:  Here for follow-up and surveillance of left tonsil and bilateral neck cancer  Narrative:  The patient returns today for routine follow-up. The patient is accompanied by a spanish interpretor today, who the pt has elected to translate for her during this visit. She notes that she completed her radiation therapy to her left tonsil and bilateral neck on 04/23/16. Pt notes that she has had vaginal bleeding, but she has an IUD in place and has had it for 3 years without any issues.  No GYN followup.  Still using PEG tube, and eating. Stable weight.  Some xerostomia.  Not using fluoride trays as instructed.  On review of systems, pt reports breakthrough vaginal bleeding with an IUD. Pt reports visional changes in the morning only.  PET results reviewed with patient. I looked at the images myself.  ALLERGIES:  is allergic to no known allergies.  Meds: Current Outpatient Prescriptions  Medication Sig Dispense Refill  . ferrous sulfate 325 (65 FE) MG tablet Take 1 tablet (325 mg total) by mouth daily. 30 tablet 0  . levonorgestrel (MIRENA) 20 MCG/24HR IUD 1 each by Intrauterine route once.    . Multiple Vitamins-Minerals (MULTIVITAMIN WITH MINERALS) tablet Take 1 tablet by mouth daily.    . pantoprazole (PROTONIX) 40 MG tablet Take 1 tablet (40 mg total) by mouth 2 (two) times daily before a meal. Tome una tableta por boca diaria 60 tablet 3  . sodium fluoride (FLUORISHIELD)  1.1 % GEL dental gel Instill one drop of gel per tooth space of fluoride tray. Place over teeth for 5 minutes. Remove. Spit out excess. Repeat nightly. (Patient not taking: Reported on 08/20/2016) 120 mL PRN  . sodium fluoride (PREVIDENT 5000 PLUS) 1.1 % CREA dental cream Apply thin ribbon of cream to tooth brush. Brush teeth for 2 minutes. Spit out excess-DO NOT swallow. DO NOT rinse afterwards. Repeat nightly. (Patient not taking: Reported on 08/20/2016) 1 Tube prn   No current facility-administered medications for this encounter.      Physical Findings: The patient is in no acute distress. Patient is alert and oriented. Wt Readings from Last 3 Encounters:  08/20/16 141 lb 12.8 oz (64.3 kg)  08/17/16 141 lb 4 oz (64.1 kg)  07/30/16 140 lb 8 oz (63.7 kg)    height is 5' 1.75" (1.568 m) and weight is 141 lb 12.8 oz (64.3 kg). Her temperature is 98.4 F (36.9 C). Her blood pressure is 110/76 and her pulse is 90. Her oxygen saturation is 99%. .  General: Alert and oriented, in no acute distress HEENT: Head is normocephalic. Extraocular movements are intact. Oropharynx is notable for tongue is midline. Mucous membranes moist. No visible lesions in throat.  Neck: Neck is notable for no palpable masses in the cervical or supracervical region. Skin: Skin in treatment fields shows satisfactory healing. Heart: Regular in rate and rhythm with no murmurs, rubs, or gallops. Chest: Clear to auscultation bilaterally, with no rhonchi, wheezes, or  rales. Right upper chest port a cath in place.  Abdomen: Soft, nontender, nondistended, with no rigidity or guarding. Extremities: No cyanosis or edema. Lymphatics: see Neck Exam Psychiatric: Judgment and insight are intact. Affect is appropriate.   Lab Findings: Lab Results  Component Value Date   WBC 3.9 (L) 06/17/2016   HGB 11.8 (L) 06/17/2016   HCT 34.4 (L) 06/17/2016   MCV 91.2 06/17/2016   PLT 214 06/17/2016    Lab Results  Component Value Date     TSH 1.200 06/17/2016    Radiographic Findings: Nm Pet Image Restag (ps) Skull Base To Thigh  Result Date: 08/17/2016 CLINICAL DATA:  Subsequent treatment strategy for are pharyngeal cancer. EXAM: NUCLEAR MEDICINE PET SKULL BASE TO THIGH TECHNIQUE: 6.8 mCi F-18 FDG was injected intravenously. Full-ring PET imaging was performed from the skull base to thigh after the radiotracer. CT data was obtained and used for attenuation correction and anatomic localization. FASTING BLOOD GLUCOSE:  Value: 92 mg/dl COMPARISON:  Multiple exams, including 02/06/2016 FINDINGS: NECK No significant CT abnormality or asymmetric activity at the site of the prior left pharyngeal mass which previously had a maximum standard uptake value of 38.5. The regional hypermetabolic adenopathy. The previous left station IIa lymph node measures 0.6 cm in short axis (within normal limits), previously 1.1 cm in short axis. Glottic structures unremarkable. Mild chronic right maxillary sinusitis. CHEST No hypermetabolic mediastinal or hilar nodes. 5 mm calcified granuloma in the superior segment right lower lobe, benign appearance. Right Port-A-Cath tip:  SVC. ABDOMEN/PELVIS No abnormal hypermetabolic activity within the liver, pancreas, adrenal glands, or spleen. The small segment 6 hypodense lesion in the right hepatic lobe appears stable and is not hypermetabolic. No hypermetabolic lymph nodes in the abdomen or pelvis. 5.5 by 4.2 cm left adnexal cyst is new and photopenic. Peg tube observed. IUD satisfactorily positioned along the endometrium. SKELETON No focal hypermetabolic activity to suggest skeletal metastasis. IMPRESSION: 1. Interval resolution of the left pharyngeal mass. No significant residual hypermetabolic activity or CT abnormality is observed in this location. No regional adenopathy or evidence of distant metastatic spread. 2. Mild chronic right maxillary sinusitis. 3. 5.5 by 4.2 cm left adnexal cyst is new and photopenic. Based on  current guidelines, pelvic sonography is recommended in 6-12 weeks for follow up. This recommendation follows ACR consensus guidelines: White Paper of the ACR Incidental Findings Committee II on Adnexal Findings. J Am Coll Radiol [phone]. Electronically Signed   By: Van Clines M.D.   On: 08/17/2016 10:06    Impression/Plan:    1) Head and Neck Cancer Status: NED; ordering GYN referral in 6 weeks for incidental adnexal cyst.  2) Nutritional Status: Pt is using PEG tube and PO feedings.  PEG tube: still intact, remove once taking all food by mouth and maintain weight for one month.   3) Risk Factors: The patient has been educated about risk factors including alcohol and tobacco abuse; they understand that avoidance of alcohol and tobacco is important to prevent recurrences as well as other cancers  4) Swallowing: Functional  5) Dental: Encouraged to continue regular followup with dentistry, and dental hygiene including fluoride rinses. Pt has not used or picked up fluoride rinses as instructed to do so.  6) Thyroid function: wnl Lab Results  Component Value Date   TSH 1.200 06/17/2016    7) GYN: Pt will be referred to Gynecologist for further evaluation of her vaginal bleeding and I will defer to them for treatment. Due to pt hx  of HPV+, this warrants follow up with gynecologists to rule out cervical cancer, etc. Pt appreciates new onset vaginal bleeding that is not typical for her and her IUD.  Cyst in ovarian seen on PET. Pt understands importance of GYN referral.  Note written w. Referral: Pt reports new vaginal bleeding.  History of HPV + throat cancer, needs screening for HPV + GYN cancers.  Also, please address adnexal cyst incidentally found on PET with sonography as appropriate.  8) Physical Therapy: Pt given referral to physical therapy in [place] for lymphedema.   9) Other: I will inform Dr. Mike Craze that the pt can have port a cath removed.  Med Onc  [facility], continue follow up with Dr. Mike Craze.   10) Pt advised to follow up in 3 months with Dr. Benjamine Mola for continued surveillance. Follow-up with me in 6 months. The patient was encouraged to call with any issues or questions before then.     Eppie Gibson, MD   This document serves as a record of services personally performed by Eppie Gibson, MD. It was created on her behalf by Steva Colder, a trained medical scribe. The creation of this record is based on the scribe's personal observations and the provider's statements to them. This document has been checked and approved by the attending provider.

## 2016-08-23 ENCOUNTER — Telehealth: Payer: Self-pay | Admitting: *Deleted

## 2016-08-23 ENCOUNTER — Telehealth: Payer: Self-pay | Admitting: Radiation Oncology

## 2016-08-23 NOTE — Telephone Encounter (Signed)
A user error has taken place: encounter opened in error, closed for administrative reasons.

## 2016-08-23 NOTE — Telephone Encounter (Signed)
Oncology Nurse Navigator Documentation  Per Dr. Pearlie Oyster guidance, called ENT Dr. Deeann Saint office to arrange appt.   Spoke with Nira Conn, requested patient be contacted and appt arranged for routine follow-up with Dr. Benjamine Mola in 3 months.  She verbalized understanding.  Gayleen Orem, RN, BSN, [place] [facility] Oncology Nurse [place] at [place] [phone]

## 2016-08-23 NOTE — Telephone Encounter (Signed)
I talked to [facility] about Heather Strickland's appts (referrals): for PT she is scheduled for 8/16 at 2:30. For GYN-Dr. Glo Herring in [place], she is scheduled for 8/23 at 3:00pm. Heather Strickland questioned porta cath removal and looking at Dr. Pearlie Oyster note/plan, it states that she will be in touch with Heather Strickland at [facility] and they will be contacting pt with appt for that.

## 2016-08-25 ENCOUNTER — Telehealth (HOSPITAL_COMMUNITY): Payer: Self-pay

## 2016-08-25 NOTE — Telephone Encounter (Signed)
Received call from Joellen Jersey, patients [place] speaking daughter-in-law. She wanted to know if we had received orders to remove the patients Port-a-cath. Reviewed noted and found this from 08/23/16: "Katie questioned porta cath removal and looking at Dr. Pearlie Oyster note/plan, it states that she will be in touch with Mike Craze at [facility] and they will be contacting pt with appt for that." Explained to patient that I would send message to [facility] and find out what's going on and would call her back. Joellen Jersey can be reached at [phone].

## 2016-08-26 ENCOUNTER — Ambulatory Visit: Payer: Self-pay | Admitting: Physical Therapy

## 2016-08-26 NOTE — Telephone Encounter (Signed)
Spoke with Joellen Jersey, patients daughter-in-law. Explained that since the port-a-cath is not bothering the patient, it is okay to leave it in for a while to be sure she doesn't need it again. Explained to Joellen Jersey that when patient has follow up in September , the provider will discuss with her then. Katie verbalized understanding.

## 2016-08-26 NOTE — Telephone Encounter (Signed)
It is definitely not an emergency to get her port-a-cath removed unless it is bothering her. Her G-tube will stay in place for a bit longer, given persistent weight loss. We can discuss when she comes back for follow-up visit in September.  There is no rush to get the port removed for right now.    Mike Craze, NP [place] [phone]

## 2016-08-30 ENCOUNTER — Encounter: Payer: Self-pay | Admitting: *Deleted

## 2016-08-31 ENCOUNTER — Ambulatory Visit: Payer: Self-pay | Admitting: Physical Therapy

## 2016-09-02 ENCOUNTER — Other Ambulatory Visit (HOSPITAL_COMMUNITY)
Admission: RE | Admit: 2016-09-02 | Discharge: 2016-09-02 | Disposition: A | Discharge status: Home / Self Care | Payer: Self-pay | Source: Ambulatory Visit | Attending: Obstetrics and Gynecology | Admitting: Obstetrics and Gynecology

## 2016-09-02 ENCOUNTER — Encounter: Payer: Self-pay | Admitting: Obstetrics and Gynecology

## 2016-09-02 ENCOUNTER — Ambulatory Visit (INDEPENDENT_AMBULATORY_CARE_PROVIDER_SITE_OTHER): Payer: Self-pay | Admitting: Obstetrics and Gynecology

## 2016-09-02 VITALS — BP 100/60 | HR 68 | Ht 65.0 in | Wt 139.6 lb

## 2016-09-02 DIAGNOSIS — Z124 Encounter for screening for malignant neoplasm of cervix: Secondary | ICD-10-CM

## 2016-09-02 DIAGNOSIS — N83202 Unspecified ovarian cyst, left side: Secondary | ICD-10-CM

## 2016-09-02 NOTE — Progress Notes (Signed)
[facility] Visit  09/02/2016            Patient name: Heather Strickland MRN [id number]  Date of birth: [birth year]  CC & HPI:  Heather Strickland is a 41 y.o. female presenting today for vaginal bleeding. Pt has an IUD in place, and has had it for 3 years with no problems. IUD was placed to reduce her period strength. She is referred to office by Dr. Isidore Moos, her radiology doctor forDilation of incidental adnexal cyst. Cyst in ovary was seen on PET. Pt has hx of HPV+ cancer, a squamous cell carcinoma of left tonsil. Pt reports blurry vision that has not occurred before. She wonders if this is related to her other problems and I've suggested that it's unlikely to be related  Pt's daughter in law, and a Patent attorney accompanied pt today.Level IV visit due to complexity of care and translator service  ROS:  ROS +vaginal bleeding -fever  Pertinent History Reviewed:   Reviewed: Significant for HPV, squamous cell carcinoma of left tonsil  Medical         Past Medical History:  Diagnosis Date  . Anemia   . GERD (gastroesophageal reflux disease)   . History of radiation therapy 03/08/2016 - 04/23/2016   Left Tonsil and Bilateral Neck  . HPV in female   . 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 CESAREAN SECTION  [id number]  . ESOPHAGOGASTRODUODENOSCOPY N/A 11/07/2015   Dr. Gala Romney: normal esophagus, chronic gastritis   . IR GENERIC HISTORICAL  02/11/2016   IR FLUORO GUIDE PORT INSERTION RIGHT 02/11/2016 Sandi Mariscal, MD [facility]  . IR GENERIC HISTORICAL  02/11/2016   IR US GUIDE VASC ACCESS RIGHT 02/11/2016 Sandi Mariscal, MD [facility]  . IR GENERIC HISTORICAL  02/11/2016   IR GASTROSTOMY TUBE MOD SED 02/11/2016 Sandi Mariscal, MD [facility]  . MULTIPLE EXTRACTIONS WITH ALVEOLOPLASTY N/A 02/20/2016   Procedure: Extraction of tooth #'s 15,17,18 and 32 with  alveoloplasty and dental cleaning of teeth;  Surgeon: Lenn Cal, DDS;  Location: [facility];  Service: Oral Surgery;  Laterality: N/A;   Medications: Reviewed & Updated - see associated section                       Current Outpatient Prescriptions:  .  ferrous sulfate 325 (65 FE) MG tablet, Take 1 tablet (325 mg total) by mouth daily., Disp: 30 tablet, Rfl: 0 .  levonorgestrel (MIRENA) 20 MCG/24HR IUD, 1 each by Intrauterine route once., Disp: , Rfl:  .  Multiple Vitamins-Minerals (MULTIVITAMIN WITH MINERALS) tablet, Take 1 tablet by mouth daily., Disp: , Rfl:  .  sodium fluoride (FLUORISHIELD) 1.1 % GEL dental gel, Instill one drop of gel per tooth space of fluoride tray. Place over teeth for 5 minutes. Remove. Spit out excess. Repeat nightly., Disp: 120 mL, Rfl: PRN .  pantoprazole (PROTONIX) 40 MG tablet, Take 1 tablet (40 mg total) by mouth 2 (two) times daily before a meal. Tome una tableta por boca diaria (Patient not taking: Reported on 09/02/2016), Disp: 60 tablet, Rfl: 3 .  sodium fluoride (PREVIDENT 5000 PLUS) 1.1 % CREA dental cream, Apply thin ribbon of cream to tooth brush. Brush teeth for 2  minutes. Spit out excess-DO NOT swallow. DO NOT rinse afterwards. Repeat nightly. (Patient not taking: Reported on 08/20/2016), Disp: 1 Tube, Rfl: prn   Social History: Reviewed -  reports that she has never smoked. She has never used smokeless tobacco.  Objective Findings:  Vitals: Blood pressure 100/60, pulse 68, height 5\' 5"  (1.651 m), weight 139 lb 9.6 oz (63.3 kg), last menstrual period 08/30/2016.  Physical Examination: General appearance - alert, well appearing, and in no distress Mental status - alert, oriented to person, place, and time Pelvic -  VULVA: normal appearing vulva with no masses, tenderness or lesions,  VAGINA: normal appearing vagina with normal color and discharge, no lesions, light menstrual bleeding CERVIX: Multiparous cervix, nontender IUD string present UTERUS:  uterus is normal size, shape, consistency and nontender, small ADNEXA: normal adnexa in size, nontender and no masses, ovaries are less than 4 cm   Pap taken   Assessment & Plan:   A:  1. abnormal uterine bleeding secondary to adnexal ovarian cyst 2. Screening Pap smear performed including HPV testing P:  1. Schedule sonogram, daughter in law will help obtaining results at home Transvaginal ultrasound at [facility] in 1 month    By signing my name below, I, Izna Ahmed, attest that this documentation has been prepared under the direction and in the presence of Jonnie Kind, MD. Electronically Signed: Jabier Gauss, Medical Scribe. 09/02/16. 3:46 PM.  I personally performed the services described in this documentation, which was SCRIBED in my presence. The recorded information has been reviewed and considered accurate. It has been edited as necessary during review. Jonnie Kind, MD

## 2016-09-03 ENCOUNTER — Ambulatory Visit: Payer: Self-pay | Attending: Radiation Oncology | Admitting: Physical Therapy

## 2016-09-03 DIAGNOSIS — R29898 Other symptoms and signs involving the musculoskeletal system: Secondary | ICD-10-CM | POA: Insufficient documentation

## 2016-09-03 DIAGNOSIS — I890 Lymphedema, not elsewhere classified: Secondary | ICD-10-CM | POA: Insufficient documentation

## 2016-09-03 DIAGNOSIS — L599 Disorder of the skin and subcutaneous tissue related to radiation, unspecified: Secondary | ICD-10-CM | POA: Insufficient documentation

## 2016-09-03 DIAGNOSIS — R293 Abnormal posture: Secondary | ICD-10-CM | POA: Insufficient documentation

## 2016-09-03 NOTE — Therapy (Signed)
[place], [place], [address] Phone: [phone]   Fax:  [phone]  Physical Therapy Evaluation  Patient Details  Name: Heather Strickland MRN: [id number] Date of Birth: [birth year] Referring Provider: Dr. Eppie Gibson  Encounter Date: 09/03/2016      PT End of Session - 09/03/16 1241    Visit Number 1   Number of Visits 3   Date for PT Re-Evaluation 10/08/16   PT Start Time 0947   PT Stop Time 1023   PT Time Calculation (min) 36 min   Activity Tolerance Patient tolerated treatment well   Behavior During Therapy [facility] for tasks assessed/performed      Past Medical History:  Diagnosis Date  . Anemia   . GERD (gastroesophageal reflux disease)   . History of radiation therapy 03/08/2016 - 04/23/2016   Left Tonsil and Bilateral Neck  . HPV in female   . 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 CESAREAN SECTION  [id number]  . ESOPHAGOGASTRODUODENOSCOPY N/A 11/07/2015   Dr. Gala Romney: normal esophagus, chronic gastritis   . IR GENERIC HISTORICAL  02/11/2016   IR FLUORO GUIDE PORT INSERTION RIGHT 02/11/2016 Sandi Mariscal, MD [facility]  . IR GENERIC HISTORICAL  02/11/2016   IR US GUIDE VASC ACCESS RIGHT 02/11/2016 Sandi Mariscal, MD [facility]  . IR GENERIC HISTORICAL  02/11/2016   IR GASTROSTOMY TUBE MOD SED 02/11/2016 Sandi Mariscal, MD [facility]  . MULTIPLE EXTRACTIONS WITH ALVEOLOPLASTY N/A 02/20/2016   Procedure: Extraction of tooth #'s 15,17,18 and 32 with alveoloplasty and dental cleaning of teeth;  Surgeon: Lenn Cal, DDS;  Location: [facility];  Service: Oral Surgery;  Laterality: N/A;    There were no vitals filed for this visit.       Subjective Assessment - 09/03/16 0955    Subjective The doctor sent me to check on this (front of neck) because I might be retaining liquids here. And the left side hurts when I open my  mouth.   Patient is accompained by: Family member;Interpreter  Floria Raveling, [facility] interpreter   Pertinent History Left tonsil and bilateral neck cancer treated with XRT which was completed 04/23/16. Noticed swelling after finishing radiation.  Also reports somepain in left upper arm and left TMJ.   Patient Stated Goals help reduce the neck so it can look normal because right now it looks tight and hard   Currently in Pain? Yes   Pain Score 3    Pain Location Arm   Pain Orientation Left;Upper   Pain Descriptors / Indicators Heaviness   Aggravating Factors  reaching arm across chest and other movements   Pain Relieving Factors rest            [facility] PT Assessment - 09/03/16 0001      Assessment   Medical Diagnosis tonsil cancer   Referring Provider Dr. Eppie Gibson   Onset Date/Surgical Date 04/23/16  finished XRT   Hand Dominance Right   Prior Therapy none     Precautions   Precautions Other (comment)   Precaution Comments cancer precautions     Restrictions   Weight Bearing Restrictions No     Balance Screen   Has the patient fallen in the past 6 months No   Has the patient had a decrease in activity level because of a fear of falling?  No   Is the patient reluctant to leave their  home because of a fear of falling?  No     Home Environment   Living Environment Private residence   Type of Home Mobile home     Prior Function   Level of [place] Requirements on medical leave while still has PEG and Port in place   Leisure no regular exercise     Cognition   Overall Cognitive Status Within Functional Limits for tasks assessed     Palpation   Palpation comment neck tissue that looks swollen feels firm     Ambulation/Gait   Ambulation/Gait Yes   Ambulation/Gait Assistance 7: Independent           LYMPHEDEMA/ONCOLOGY QUESTIONNAIRE - 09/03/16 1004      Type   Cancer Type tonsil and neck     Treatment   Past Radiation Treatment  Yes   Date 04/23/16     What other symptoms do you have   Are you Having Heaviness or Tightness Yes  left arm     Lymphedema Assessments   Lymphedema Assessments Head and Neck     Head and Neck   4 cm superior to sternal notch around neck 35.2 cm   6 cm superior to sternal notch around neck 35.8 cm   8 cm superior to sternal notch around neck 37.3 cm         Objective measurements completed on examination: See above findings.          [place] Adult PT Treatment/Exercise - 09/03/16 0001      Self-Care   Self-Care Other Self-Care Comments   Other Self-Care Comments  fashioned foam chip pack in stockinette, placed it on patient for neck compression, and instructed in its use.  Gave information about the Contour 331 neck compression garment and how to obtain through internet (size small) or [employer] in [place].                PT Education - 09/03/16 1240    Education provided Yes   Education Details to wear foam chip pack 2-4 hours/day and observe for benefits; how to obtain manufactured compression garment   Person(s) Educated Patient   Methods Explanation;Demonstration;Verbal cues  through interpreter   Comprehension Verbalized understanding                [facility] Goals - 09/03/16 1246      CC Long Term Goal  #1   Title Pt. will be independent in performing manual lymph drainage for her neck.   Time 2   Period Weeks   Status New     CC Long Term Goal  #2   Title Pt. will know how and where to obtain manufactured neck compression garment.   Time 2   Period Weeks   Status New             Plan - 09/03/16 1242    Clinical Impression Statement Pt. who completed XRT for tonsillar and neck cancer in April now with fullness and induration at her neck.  She also reports left upper arm pain and some left TMJ pain.   Clinical Decision Making Low   Rehab Potential Good   PT Frequency 1x / week   PT Duration 2 weeks   PT  Treatment/Interventions ADLs/Self Care Home Management;Therapeutic exercise;Patient/family education;Orthotic Fit/Training;Manual techniques;Manual lymph drainage;Compression bandaging;Passive range of motion   PT Next Visit Plan Begin manual lymph drainage to neck and instructing patient to perform this herself.  Check benefit of chip pack; see if she ordered/obtained a compression garment. Address left TMJ, left UE pain as time allows.   PT Home Exercise Plan use chip pack 2-4 hours a day   Consulted and Agree with Plan of Care Patient      Patient will benefit from skilled therapeutic intervention in order to improve the following deficits and impairments:  Increased edema, Pain, Decreased knowledge of precautions, Decreased knowledge of use of DME  Visit Diagnosis: Lymphedema, not elsewhere classified - Plan: PT plan of care cert/re-cert  Disorder of the skin and subcutaneous tissue related to radiation, unspecified - Plan: PT plan of care cert/re-cert     Problem List Patient Active Problem List   Diagnosis Date Noted  . Port catheter in place 04/22/2016  . 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 Oral candidiasis 03/30/2016  . Weight loss 03/30/2016  . Hemoptysis 03/11/2016  . Cancer of tonsillar fossa ([place]) 01/27/2016  . GERD (gastroesophageal reflux disease) 12/22/2015  . H. pylori infection 07/21/2015  . Esophageal reflux 06/19/2015  . Prediabetes 06/19/2015  . Obesity, unspecified 06/19/2015  . Absolute anemia 06/19/2015    Genesis Novosad 09/03/2016, 12:49 PM  [place], [place], [address] Phone: [phone]   Fax:  [phone]  Name: [place] Kreitzer MRN: [id number] Date of Birth: 1975/09/18  Serafina Royals, PT 09/03/16 12:49 PM

## 2016-09-07 ENCOUNTER — Ambulatory Visit: Payer: Self-pay | Admitting: Physical Therapy

## 2016-09-07 LAB — CYTOLOGY - PAP
Diagnosis: NEGATIVE
HPV (WINDOPATH): NOT DETECTED

## 2016-09-08 ENCOUNTER — Ambulatory Visit: Payer: Self-pay

## 2016-09-08 DIAGNOSIS — R293 Abnormal posture: Secondary | ICD-10-CM

## 2016-09-08 DIAGNOSIS — L599 Disorder of the skin and subcutaneous tissue related to radiation, unspecified: Secondary | ICD-10-CM

## 2016-09-08 DIAGNOSIS — R29898 Other symptoms and signs involving the musculoskeletal system: Secondary | ICD-10-CM

## 2016-09-08 DIAGNOSIS — I890 Lymphedema, not elsewhere classified: Secondary | ICD-10-CM

## 2016-09-08 NOTE — Patient Instructions (Signed)
Manual Lymph Drainage for face/neck   1) Place hands on areas just above collar bones and do 15-20 stationary circles 2) Place hands on either side of neck and do 15-20 stationary circles  3) Do stationary circles at both shoulders (about 10 times) 4) Do stationary circles on both armpit areas (about 10 times) 5) Take 5 deep breaths 6) Standing at the head of the bed, do stationary circles on each side of the neck just below the jaw line (15-20) 7) Standing at the head of the bed, do downward circles just underneath the chin (15-20) 8) Standing at the head of the bed, do stationary circles on each side of the face on the cheeks just above the jaw line (15-20) 9) Standing at the head of the bed, do stationary downward circles on each side of the face between the eyes and ears (15-20) 10) Do downward circles under the eyes just above the cheeks (15-20) 11) Repeat step 2 12) Repeat step 1  Cancer Rehab [phone]

## 2016-09-08 NOTE — Therapy (Signed)
[place], [place], [address] Phone: [phone]   Fax:  [phone]  Physical Therapy Treatment  Patient Details  Name: Heather Strickland MRN: [id number] Date of Birth: 13-Dec-1975 Referring Provider: Dr. Eppie Gibson  Encounter Date: 09/08/2016      PT End of Session - 09/08/16 1520    Visit Number 2   Number of Visits 3   Date for PT Re-Evaluation 10/08/16   PT Start Time 6283   PT Stop Time 1518   PT Time Calculation (min) 41 min   Activity Tolerance Patient tolerated treatment well   Behavior During Therapy [facility] for tasks assessed/performed      Past Medical History:  Diagnosis Date  . Anemia   . GERD (gastroesophageal reflux disease)   . History of radiation therapy 03/08/2016 - 04/23/2016   Left Tonsil and Bilateral Neck  . HPV in female   . 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 CESAREAN SECTION  [id number]  . ESOPHAGOGASTRODUODENOSCOPY N/A 11/07/2015   Dr. Gala Romney: normal esophagus, chronic gastritis   . IR GENERIC HISTORICAL  02/11/2016   IR FLUORO GUIDE PORT INSERTION RIGHT 02/11/2016 Sandi Mariscal, MD [facility]  . IR GENERIC HISTORICAL  02/11/2016   IR US GUIDE VASC ACCESS RIGHT 02/11/2016 Sandi Mariscal, MD [facility]  . IR GENERIC HISTORICAL  02/11/2016   IR GASTROSTOMY TUBE MOD SED 02/11/2016 Sandi Mariscal, MD [facility]  . MULTIPLE EXTRACTIONS WITH ALVEOLOPLASTY N/A 02/20/2016   Procedure: Extraction of tooth #'s 15,17,18 and 32 with alveoloplasty and dental cleaning of teeth;  Surgeon: Lenn Cal, DDS;  Location: [place];  Service: Oral Surgery;  Laterality: N/A;    There were no vitals filed for this visit.      Subjective Assessment - 09/08/16 1440    Subjective Been wearing the chip pack a few hours during the day and I think it has helped a little. My jaw really hurts when I open my mouth.    Patient is accompained by: Family member;Interpreter  Costella Hatcher, [employer] interpreter   Pertinent History Left tonsil and bilateral neck cancer treated with XRT which was completed 04/23/16. Noticed swelling after finishing radiation.  Also reports somepain in left upper arm and left TMJ.   Patient Stated Goals help reduce the neck so it can look normal because right now it looks tight and hard   Currently in Pain? Yes   Pain Score 5    Pain Location Jaw   Pain Orientation Left   Pain Descriptors / Indicators Stabbing   Pain Type Other (Comment)  from radiation   Aggravating Factors  opening mouth and when eating   Pain Relieving Factors nothing really                         [facility] Adult PT Treatment/Exercise - 09/08/16 0001      Manual Therapy   Manual Therapy Manual Lymphatic Drainage (MLD);Soft tissue mobilization;Passive ROM   Soft tissue mobilization To Lt sternocleidomastoid area and scalenes there as well   Manual Lymphatic Drainage (MLD) In Supine: Short neck, 5 diaphragmatic breaths,  bil shoulder collectors, bil axillae nodes, anterior throat, submandibular nodes, bil masseters, pre- and retroauricular nodes and suboccipital nodes all redirecting towards bil lateral neck   Passive ROM Into Rt cervical side bend and rotation with soft tissue work  PT Education - 09/08/16 1518    Education provided Yes   Education Details Cont to wear chip pack and was issued handout for self manual lymph drainage   Person(s) Educated Patient   Methods Explanation;Demonstration;Tactile cues;Handout  through interpreter   Comprehension Verbalized understanding;Need further instruction                [facility] Goals - 09/03/16 1246      CC Long Term Goal  #1   Title Pt. will be independent in performing manual lymph drainage for her neck.   Time 2   Period Weeks   Status New     CC Long Term Goal  #2   Title Pt. will know how and where to  obtain manufactured neck compression garment.   Time 2   Period Weeks   Status New            Plan - 09/08/16 1520    Clinical Impression Statement PT reported neck feeling a little better and looser after session today. Reviewed importance if her wearing chip pack as she has been doing a few hours a day but not to sleep with (she tried this a few times). Also suggested pt to try self manual lymph drainage before next appt so we can practice when she returns for next and last visit.    Rehab Potential Good   PT Frequency 1x / week   PT Duration 2 weeks   PT Treatment/Interventions ADLs/Self Care Home Management;Therapeutic exercise;Patient/family education;Orthotic Fit/Training;Manual techniques;Manual lymph drainage;Compression bandaging;Passive range of motion   PT Next Visit Plan Cont manual lymph drainage to neck and have patient perform.  Check benefit of chip pack; see if she ordered/obtained a compression garment and help her with this if she has not done yet. Address left TMJ, left UE pain as time allows, maybe issue supine cane exs??   PT Home Exercise Plan use chip pack 2-4 hours a day and begin self manual lymph drainage   Consulted and Agree with Plan of Care Patient      Patient will benefit from skilled therapeutic intervention in order to improve the following deficits and impairments:  Increased edema, Pain, Decreased knowledge of precautions, Decreased knowledge of use of DME  Visit Diagnosis: Lymphedema, not elsewhere classified  Disorder of the skin and subcutaneous tissue related to radiation, unspecified  Abnormal posture  Other symptoms and signs involving the musculoskeletal system     Problem List Patient Active Problem List   Diagnosis Date Noted  . Port catheter in place 04/22/2016  . 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 Oral candidiasis 03/30/2016  . Weight loss 03/30/2016  . Hemoptysis 03/11/2016  . Cancer of tonsillar fossa ([place])  01/27/2016  . GERD (gastroesophageal reflux disease) 12/22/2015  . H. pylori infection 07/21/2015  . Esophageal reflux 06/19/2015  . Prediabetes 06/19/2015  . Obesity, unspecified 06/19/2015  . Absolute anemia 06/19/2015    Otelia Limes, PTA 09/08/2016, 3:23 PM  [place], [place], [address] Phone: [phone]   Fax:  [phone]  Name: Heather Strickland MRN: [id number] Date of Birth: 1975-03-16

## 2016-09-10 ENCOUNTER — Ambulatory Visit (HOSPITAL_COMMUNITY): Payer: Self-pay | Admitting: Adult Health

## 2016-09-10 ENCOUNTER — Encounter (HOSPITAL_COMMUNITY): Payer: Self-pay

## 2016-09-15 ENCOUNTER — Encounter: Payer: Self-pay | Admitting: Obstetrics and Gynecology

## 2016-09-20 ENCOUNTER — Ambulatory Visit: Payer: Self-pay | Attending: Radiation Oncology | Admitting: Physical Therapy

## 2016-09-20 DIAGNOSIS — R293 Abnormal posture: Secondary | ICD-10-CM | POA: Insufficient documentation

## 2016-09-20 DIAGNOSIS — I890 Lymphedema, not elsewhere classified: Secondary | ICD-10-CM | POA: Insufficient documentation

## 2016-09-20 DIAGNOSIS — R29898 Other symptoms and signs involving the musculoskeletal system: Secondary | ICD-10-CM | POA: Insufficient documentation

## 2016-09-20 DIAGNOSIS — L599 Disorder of the skin and subcutaneous tissue related to radiation, unspecified: Secondary | ICD-10-CM | POA: Insufficient documentation

## 2016-09-20 NOTE — Therapy (Signed)
[facility] [address] [place], [place], [address] Phone: [phone]   Fax:  [phone]  Physical Therapy Treatment  Patient Details  Name: Heather Strickland MRN: [id number] Date of Birth: [birth year] Referring Provider: Dr. Lonie Peak  Encounter Date: 09/20/2016      PT End of Session - 09/20/16 2037    Visit Number 3   Number of Visits 4   Date for PT Re-Evaluation 10/08/16   PT Start Time 1520   PT Stop Time 1604   PT Time Calculation (min) 44 min   Activity Tolerance Patient tolerated treatment well   Behavior During Therapy [facility] for tasks assessed/performed      Past Medical History:  Diagnosis Date  . Anemia   . GERD (gastroesophageal reflux disease)   . History of radiation therapy 03/08/2016 - 04/23/2016   Left Tonsil and Bilateral Neck  . HPV in female   . Squamous cell carcinoma of left tonsil ([facility]) 01/27/2016    Past Surgical History:  Procedure Laterality Date  . CESAREAN SECTION  [id number]  . ESOPHAGOGASTRODUODENOSCOPY N/A 11/07/2015   Dr. Jena Gauss: normal esophagus, chronic gastritis   . IR GENERIC HISTORICAL  02/11/2016   IR FLUORO GUIDE PORT INSERTION RIGHT 02/11/2016 Simonne Come, MD [facility]  . IR GENERIC HISTORICAL  02/11/2016   IR US GUIDE VASC ACCESS RIGHT 02/11/2016 Simonne Come, MD [facility]  . IR GENERIC HISTORICAL  02/11/2016   IR GASTROSTOMY TUBE MOD SED 02/11/2016 Simonne Come, MD [facility]  . MULTIPLE EXTRACTIONS WITH ALVEOLOPLASTY N/A 02/20/2016   Procedure: Extraction of tooth #'s 15,17,18 and 32 with alveoloplasty and dental cleaning of teeth;  Surgeon: Charlynne Pander, DDS;  Location: [facility];  Service: Oral Surgery;  Laterality: N/A;    There were no vitals filed for this visit.      Subjective Assessment - 09/20/16 1521    Subjective Has been practicing and trying the manual lymph drainage at home.  "I may not do it exactly right, but I'm trying." Hasn't been able to  get a compression garment.    Patient is accompained by: Interpreter  Josefina Do, [employer]   Currently in Pain? No/denies            [facility] PT Assessment - 09/20/16 0001      Assessment   Medical Diagnosis             LYMPHEDEMA/ONCOLOGY QUESTIONNAIRE - 09/20/16 1534      Head and Neck   4 cm superior to sternal notch around neck 34.7 cm   6 cm superior to sternal notch around neck 34.8 cm   8 cm superior to sternal notch around neck 37.1 cm                  [facility] Adult PT Treatment/Exercise - 09/20/16 0001      Manual Therapy   Manual Therapy Edema management;Compression Bandaging   Edema Management Fit patient with a neck and partial face compression garment that the clinic was given; fit was achieved and patient was also given a rectangle of gray foam to place inside the garment to enhance compression if needed.  She felt like the garment did provide gentle compression and would work for her.  circumference measurements taken   Manual Lymphatic Drainage (MLD) Reviewed technique of manual lymph drainage with verbal and demonstration cueing for the following: In Supine: Short neck, 5 diaphragmatic breaths,  bil shoulder collectors, bil axillae nodes, anterior throat, submandibular  nodes, bil masseters, pre- and retroauricular nodes and suboccipital nodes all redirecting towards bil lateral neck                PT Education - 09/20/16 2036    Education provided Yes   Education Details manual lymph drainage reviewed   Person(s) Educated Patient   Methods Explanation;Demonstration;Tactile cues   Comprehension Verbalized understanding                [facility] Goals - 09/20/16 2042      CC Long Term Goal  #1   Title Pt. will be independent in performing manual lymph drainage for her neck.   Status Partially Met     CC Long Term Goal  #2   Title Pt. will know how and where to obtain manufactured neck compression garment.   Baseline  Donated new garment issued today to her.   Status Achieved            Plan - 09/20/16 2038    Clinical Impression Statement Neck circumference measurements showed reductions at all levels today, with the greatest reduction at 6 cm. superior to sternal notch (1 cm. reduction there).  Pt. has noticed improvements. Pt. had concerns about discharge today, wanting her circumference measurements to be monitored. We agreed to have her come back in about three weeks to check measurements and to review as needed.   Rehab Potential Good   PT Frequency 1x / week   PT Duration 3 weeks   PT Treatment/Interventions ADLs/Self Care Home Management;Therapeutic exercise;Patient/family education;Orthotic Fit/Training;Manual techniques;Manual lymph drainage;Compression bandaging;Passive range of motion   PT Next Visit Plan Remeasure circumferences. Check on how the neck compression garment is working for her.  Check on how manual lymph drainage is going. Address left TMJ, left UE pain as time allows.   PT Home Exercise Plan use new compression garment, continue self-manual lymph drainage   Consulted and Agree with Plan of Care Patient      Patient will benefit from skilled therapeutic intervention in order to improve the following deficits and impairments:  Increased edema, Pain, Decreased knowledge of precautions, Decreased knowledge of use of DME  Visit Diagnosis: Lymphedema, not elsewhere classified  Disorder of the skin and subcutaneous tissue related to radiation, unspecified  Abnormal posture  Other symptoms and signs involving the musculoskeletal system     Problem List Patient Active Problem List   Diagnosis Date Noted  . Port catheter in place 04/22/2016  . 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 Oral candidiasis 03/30/2016  . Weight loss 03/30/2016  . Hemoptysis 03/11/2016  . Cancer of tonsillar fossa ([place]) 01/27/2016  . GERD (gastroesophageal reflux disease) 12/22/2015  .  H. pylori infection 07/21/2015  . Esophageal reflux 06/19/2015  . Prediabetes 06/19/2015  . Obesity, unspecified 06/19/2015  . Absolute anemia 06/19/2015    SALISBURY,DONNA 09/20/2016, 8:43 PM  [place], [place], [address] Phone: [phone]   Fax:  [phone]  Name: Berenize Gatlin MRN: [id number] Date of Birth: 12-16-1975  Serafina Royals, PT 09/20/16 8:44 PM

## 2016-09-28 ENCOUNTER — Ambulatory Visit: Payer: Self-pay | Admitting: Physician Assistant

## 2016-09-28 ENCOUNTER — Encounter: Payer: Self-pay | Admitting: Physician Assistant

## 2016-09-28 VITALS — BP 96/60 | HR 79 | Temp 98.1°F | Ht 62.0 in | Wt 137.5 lb

## 2016-09-28 DIAGNOSIS — Z124 Encounter for screening for malignant neoplasm of cervix: Secondary | ICD-10-CM

## 2016-09-28 NOTE — Progress Notes (Signed)
BP 96/60 (BP Location: Left Arm, Patient Position: Sitting, Cuff Size: Normal)   Pulse 79   Temp 98.1 F (36.7 C) (Other (Comment))   Ht 5\' 2"  (1.575 m)   Wt 137 lb 8 oz (62.4 kg)   LMP 08/30/2016 (Approximate)   SpO2 98%   BMI 25.15 kg/m    Subjective:    Patient ID: Heather Strickland, female    DOB: 09-May-1975, 41 y.o.   MRN: [id number]  HPI: Heather Strickland is a 41 y.o. female presenting on 09/28/2016 for Gynecologic Exam   HPI   Pt here today for PAP smear.   She reports no other problems today  Relevant past medical, surgical, family and social history reviewed and updated as indicated. Interim medical history since our last visit reviewed.  Allergies and medications reviewed and updated.   Current Outpatient Prescriptions:  .  ferrous sulfate 325 (65 FE) MG tablet, Take 1 tablet (325 mg total) by mouth daily., Disp: 30 tablet, Rfl: 0 .  levonorgestrel (MIRENA) 20 MCG/24HR IUD, 1 each by Intrauterine route once., Disp: , Rfl:  .  Multiple Vitamins-Minerals (MULTIVITAMIN WITH MINERALS) tablet, Take 1 tablet by mouth daily., Disp: , Rfl:  .  pantoprazole (PROTONIX) 40 MG tablet, Take 1 tablet (40 mg total) by mouth 2 (two) times daily before a meal. Tome una tableta por boca diaria (Patient taking differently: Take 40 mg by mouth daily. Tome una tableta por boca diaria), Disp: 60 tablet, Rfl: 3 .  sodium fluoride (FLUORISHIELD) 1.1 % GEL dental gel, Instill one drop of gel per tooth space of fluoride tray. Place over teeth for 5 minutes. Remove. Spit out excess. Repeat nightly. (Patient not taking: Reported on 09/03/2016), Disp: 120 mL, Rfl: PRN .  sodium fluoride (PREVIDENT 5000 PLUS) 1.1 % CREA dental cream, Apply thin ribbon of cream to tooth brush. Brush teeth for 2 minutes. Spit out excess-DO NOT swallow. DO NOT rinse afterwards. Repeat nightly. (Patient not taking: Reported on 08/20/2016), Disp: 1 Tube, Rfl: prn   Review of Systems  Constitutional: Negative  for appetite change, chills, diaphoresis, fatigue, fever and unexpected weight change.  HENT: Positive for dental problem and voice change. Negative for congestion, drooling, ear pain, facial swelling, hearing loss, mouth sores, sneezing, sore throat and trouble swallowing.   Eyes: Negative for pain, discharge, redness, itching and visual disturbance.  Respiratory: Negative for cough, choking, shortness of breath and wheezing.   Cardiovascular: Negative for chest pain, palpitations and leg swelling.  Gastrointestinal: Negative for abdominal pain, blood in stool, constipation, diarrhea and vomiting.  Endocrine: Negative for cold intolerance, heat intolerance and polydipsia.  Genitourinary: Negative for decreased urine volume, dysuria and hematuria.  Musculoskeletal: Negative for arthralgias, back pain and gait problem.  Skin: Negative for rash.  Allergic/Immunologic: Negative for environmental allergies.  Neurological: Negative for seizures, syncope, light-headedness and headaches.  Hematological: Negative for adenopathy.  Psychiatric/Behavioral: Negative for agitation, dysphoric mood and suicidal ideas. The patient is not nervous/anxious.     Per HPI unless specifically indicated above     Objective:    BP 96/60 (BP Location: Left Arm, Patient Position: Sitting, Cuff Size: Normal)   Pulse 79   Temp 98.1 F (36.7 C) (Other (Comment))   Ht 5\' 2"  (1.575 m)   Wt 137 lb 8 oz (62.4 kg)   LMP 08/30/2016 (Approximate)   SpO2 98%   BMI 25.15 kg/m   Wt Readings from Last 3 Encounters:  09/28/16 137 lb 8 oz (62.4 kg)  09/02/16  139 lb 9.6 oz (63.3 kg)  08/20/16 141 lb 12.8 oz (64.3 kg)    Physical Exam  Constitutional: She is oriented to person, place, and time. She appears well-developed and well-nourished.  Pulmonary/Chest: Effort normal.  Breast exam normal  Abdominal: Soft. She exhibits no mass. There is no tenderness. There is no rebound and no guarding.  Genitourinary: Vagina  normal and uterus normal. No breast swelling, tenderness, discharge or bleeding. There is no rash, tenderness or lesion on the right labia. There is no rash, tenderness or lesion on the left labia. Cervix exhibits no motion tenderness, no discharge and no friability. Right adnexum displays no mass, no tenderness and no fullness. Left adnexum displays no mass, no tenderness and no fullness.  Genitourinary Comments: (nurse Berenice assisted)  Neurological: She is alert and oriented to person, place, and time.  Skin: Skin is warm and dry.  Psychiatric: She has a normal mood and affect. Her behavior is normal.  Nursing note and vitals reviewed.       Assessment & Plan:    Encounter Diagnosis  Name Primary?  . Cervical cancer screening Yes     -after exam, it was noted that pt had PAP last [facility] at [facility].  She was given results of the test -pt to follow up in 6 months.  RTO sooner prn

## 2016-10-01 ENCOUNTER — Encounter (HOSPITAL_COMMUNITY): Payer: Self-pay | Admitting: Lab

## 2016-10-01 ENCOUNTER — Encounter (HOSPITAL_COMMUNITY): Payer: Self-pay | Attending: Oncology

## 2016-10-01 ENCOUNTER — Encounter (HOSPITAL_COMMUNITY): Payer: Self-pay

## 2016-10-01 ENCOUNTER — Encounter (HOSPITAL_BASED_OUTPATIENT_CLINIC_OR_DEPARTMENT_OTHER): Payer: Self-pay | Admitting: Oncology

## 2016-10-01 ENCOUNTER — Ambulatory Visit (HOSPITAL_COMMUNITY): Payer: Self-pay

## 2016-10-01 VITALS — BP 119/71 | HR 58 | Resp 16 | Ht 62.0 in | Wt 141.0 lb

## 2016-10-01 DIAGNOSIS — Z431 Encounter for attention to gastrostomy: Secondary | ICD-10-CM

## 2016-10-01 DIAGNOSIS — C090 Malignant neoplasm of tonsillar fossa: Secondary | ICD-10-CM

## 2016-10-01 NOTE — Progress Notes (Signed)
Nutrition Follow-up:  Met with patient following clinic visit this pm.  Daughter in Human resources officer with patient and serving as interpreter.   Dr. Talbert Cage planning on having PEG tube removed by IR.   Patient reports that she has still been using PEG tube and instilling 2 ensure/boost per day via tube.  Patient reports no nausea, problems swallowing, diarrhea, or constipation.  Patient reports taste is still an issue.  Reports that she is eating eggs, bread, bacon, oatmeal or pancakes for breakfast, for lunch and dinner rice, vegetables, chicken, fish, beans.    Patient reports that she does not like to drink the boost/ensure because she does not like the smell.   Medications: reviewed  Labs: reviewed  Anthropometrics:   Weight has increased to 141 lb today in clinic.  Noted previous measurements of 137 lb, 139 lb on 8/23.    157. 2 pounds in March   Estimated Energy Needs  Kcals: 2132-2485 calories/d Protein: 92-107 g/d Fluid: 2.5 L/d  NUTRITION DIAGNOSIS: Unintentional weight loss stable   INTERVENTION:  Discussed importance of consuming calories from ensure/boost orally after tube is taken out.  Encouraged patient to put ensure/boost in cup with lid to help with smell.  Stressed importance of good nutrition orally and weight maintenance.   Discussed amount of calories and protein patient should have to maintain weight and provided sample meal plan in spanish for patient.   Contact information provided to patient and daughter in law.      MONITORING, EVALUATION, GOAL: weight trends, intake    NEXT VISIT: as needed.  Patient to contact with questions  Taiz Bickle B. [name], [place], [place] Registered Dietitian [phone] (pager)

## 2016-10-01 NOTE — Progress Notes (Signed)
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 Benjamine Mola      01/15/2016 Initial Diagnosis    She presented to Dr. Benjamine Mola after being referred by [name], NP (GI) with a 5-6 month history of severe sore throat.  The patient never sought treatment for this issue previously.  Over the last 3 months, patient reported intermittent bleeding from left tonsil.  She also feels a hard mass along the left side of neck.  On physical exam by Dr. Benjamine Mola, he noted 3+ ulceration and erythema on the left tonsil.  On flexible laryngoscopy, tonsillar hypertrophy with ulceration was noted on the left.      01/19/2016 Pathology Results    Invasive squamous cell carcinoma, poorly differentiated.      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 Fine soft tissue details lost at these levels.  Pharyngeal mass centered in the left palatini tonsil with surround mucosal thickening as described below probably representing neoplasm.  Mucosal thickening of the left aspect of hypopharynx and pharynx with inferior most extension to the margin of the supraglottic airway without appreciable invasion into epiglottis, aryepiglottic folds, vocal cords, or paraglottic fat.  Superior most extension of mucosal thickening to the left aspect of the uvula and along the left aspect of the soft palate to the hard/soft palate junction, this region is obscured by  motion artifact.  Anteriorly there is effacement of left glossopharyngeal sulcus without appreciable tongue base invasion, this region is obscured by motion artifact.  Asymmetric enlargement of left-sided level 2 and 3 cervical lymph nodes possibly metastatic. No evidence for lymph node necrosis or extra nodal extension.      01/29/2016 Imaging    CT chest-  No evidence of metastatic disease in the chest.      02/06/2016 PET scan    1. Highly hypermetabolic left palatine tonsillar mass, maximum SUV 38.5. 2. A left station IIa lymph node measure 1.1 cm in short axis, mildly enlarged on image 38/3, but has only a borderline elevated SUV at 3.5. 3. Other imaging findings of potential clinical significance: Mild cardiomegaly. Calcified granuloma in the superior segment right lower lobe (benign). IUD satisfactorily positioned in the uterus.      02/11/2016 Procedure    Successful fluoroscopic insertion of a 20-French pull-through gastrostomy tube.      02/11/2016 Procedure    Successful placement of a right internal jugular approach power injectable Port-A-Cath. The catheter is ready for immediate use.      03/08/2016 - 04/23/2016 Radiation Therapy    Radiation alone Isidore Moos). Left Tonsil and Bilateral neck / 70 Gy in 35 fractions to gross disease, 63 Gy in 35 fractions to high risk nodal echelons, and 56 Gy in 35 fractions to intermediate risk nodal echelons        INTERVAL HISTORY:  [name] presents for routine follow-up since completing definitive radiation therapy  for left tonsil cancer.   Patient presents today for follow up with her daughter in law. She states that she has been doing well. She can eat all foods by mouth now. She states her weight is stable. She does have some fatigue but it has not worsened. She saw an optometrist who ruled her out for cataracts and glaucomas but patient continues to see white flashes of light.   REVIEW OF SYSTEMS:  Review  of Systems  Constitutional: Positive for fatigue.  HENT:   Negative for sore throat.   Eyes: Positive for eye problems.  Respiratory: Negative.  Negative for cough and shortness of breath.   Cardiovascular: Negative.  Negative for chest pain and leg swelling.  Gastrointestinal: Negative.  Negative for abdominal pain, blood in stool, constipation, diarrhea, nausea and vomiting.  Endocrine: Negative.   Genitourinary: Negative for dysuria and hematuria.   Musculoskeletal: Negative for gait problem and neck pain.  Neurological: Negative.  Negative for dizziness, extremity weakness, gait problem, headaches, light-headedness and speech difficulty.  Hematological: Negative.   Psychiatric/Behavioral: Negative.      PAST MEDICAL/SURGICAL HISTORY:  Past Medical History:  Diagnosis Date  . Anemia   . GERD (gastroesophageal reflux disease)   . History of radiation therapy 03/08/2016 - 04/23/2016   Left Tonsil and Bilateral Neck  . HPV in female   . Squamous cell carcinoma of left tonsil ([place]) 01/27/2016   Past Surgical History:  Procedure Laterality Date  . CESAREAN SECTION  [id number]  . ESOPHAGOGASTRODUODENOSCOPY N/A 11/07/2015   Dr. Gala Romney: normal esophagus, chronic gastritis   . IR GENERIC HISTORICAL  02/11/2016   IR FLUORO GUIDE PORT INSERTION RIGHT 02/11/2016 Sandi Mariscal, MD [facility]  . IR GENERIC HISTORICAL  02/11/2016   IR US GUIDE VASC ACCESS RIGHT 02/11/2016 Sandi Mariscal, MD [facility]  . IR GENERIC HISTORICAL  02/11/2016   IR GASTROSTOMY TUBE MOD SED 02/11/2016 Sandi Mariscal, MD [facility]  . MULTIPLE EXTRACTIONS WITH ALVEOLOPLASTY N/A 02/20/2016   Procedure: Extraction of tooth #'s 15,17,18 and 32 with alveoloplasty and dental cleaning of teeth;  Surgeon: Lenn Cal, DDS;  Location: [facility];  Service: Oral Surgery;  Laterality: N/A;     SOCIAL HISTORY:  Social History   Social History  . Marital status: Married    Spouse name: N/A  . Number of children: 4  . Years of  education: N/A   Occupational History  . Not on file.   Social History Main Topics  . Smoking status: Never Smoker  . Smokeless tobacco: Never Used  . Alcohol use No  . Drug use: No  . Sexual activity: Not Currently    Birth control/ protection: IUD   Other Topics Concern  . Not on file   Social History Narrative  . No narrative on file    FAMILY HISTORY:  Family History  Problem Relation Age of Onset  . Diabetes Mother   . Hypertension Mother   . Prostatitis Father   . ADD / ADHD Daughter   . Cerebral palsy Son     CURRENT MEDICATIONS:  Outpatient Encounter Prescriptions as of 10/01/2016  Medication Sig  . ferrous sulfate 325 (65 FE) MG tablet Take 1 tablet (325 mg total) by mouth daily.  Marland Kitchen levonorgestrel (MIRENA) 20 MCG/24HR IUD 1 each by Intrauterine route once.  . Multiple Vitamins-Minerals (MULTIVITAMIN WITH MINERALS) tablet Take 1 tablet by mouth daily.  . pantoprazole (PROTONIX) 40 MG tablet Take 1 tablet (40 mg total) by mouth 2 (  two) times daily before a meal. Tome una tableta por boca diaria (Patient taking differently: Take 40 mg by mouth daily. Tome una tableta por boca diaria)  . sodium fluoride (FLUORISHIELD) 1.1 % GEL dental gel Instill one drop of gel per tooth space of fluoride tray. Place over teeth for 5 minutes. Remove. Spit out excess. Repeat nightly.  . sodium fluoride (PREVIDENT 5000 PLUS) 1.1 % CREA dental cream Apply thin ribbon of cream to tooth brush. Brush teeth for 2 minutes. Spit out excess-DO NOT swallow. DO NOT rinse afterwards. Repeat nightly.   No facility-administered encounter medications on file as of 10/01/2016.     ALLERGIES:  Allergies  Allergen Reactions  . No Known Allergies      PHYSICAL EXAM:  ECOG Performance status: 1 - Symptomatic; largely independent.   Vitals:   10/01/16 1239  BP: 119/71  Pulse: (!) 58  Resp: 16  SpO2: 100%   Filed Weights   10/01/16 1239  Weight: 141 lb (64 kg)    Historical weights  reviewed from radiation oncology clinic:     Physical Exam  Constitutional: She is oriented to person, place, and time and well-developed, well-nourished, and in no distress.  HENT:  Head: Normocephalic.  Mouth/Throat: Oropharynx is clear and moist.  Xerostomia -Mild posterior oropharyngeal erythema (as expected s/p radiation treatment)   Eyes: Pupils are equal, round, and reactive to light. Conjunctivae are normal. No scleral icterus.  Neck: Normal range of motion. Neck supple.  Mild hyperpigmentation to anterior neck  Cardiovascular: Normal rate and regular rhythm.   Pulmonary/Chest: Effort normal and breath sounds normal. No respiratory distress. She has no wheezes. She has no rales.  Abdominal: Soft. Bowel sounds are normal. She exhibits no distension. There is no tenderness. There is no rebound.  G-tube in place  Musculoskeletal: Normal range of motion. She exhibits no edema.  Lymphadenopathy:    She has no cervical adenopathy.  Neurological: She is alert and oriented to person, place, and time. She has normal sensation, normal strength and intact cranial nerves. No cranial nerve deficit. Gait normal.  Skin: Skin is warm and dry. No rash noted.  Psychiatric: Mood, memory, affect and judgment normal.  Nursing note and vitals reviewed.    LABORATORY DATA:  I have reviewed the labs as listed.  CBC    Component Value Date/Time   WBC 3.9 (L) 06/17/2016 1415   RBC 3.77 (L) 06/17/2016 1415   HGB 11.8 (L) 06/17/2016 1415   HGB 11.4 (L) 03/26/2016 1531   HCT 34.4 (L) 06/17/2016 1415   HCT 33.4 (L) 03/26/2016 1531   PLT 214 06/17/2016 1415   PLT 213 03/26/2016 1531   MCV 91.2 06/17/2016 1415   MCV 90.8 03/26/2016 1531   MCH 31.3 06/17/2016 1415   MCHC 34.3 06/17/2016 1415   RDW 13.1 06/17/2016 1415   RDW 13.1 03/26/2016 1531   LYMPHSABS 0.6 (L) 06/17/2016 1415   LYMPHSABS 0.5 (L) 03/26/2016 1531   MONOABS 0.3 06/17/2016 1415   MONOABS 0.3 03/26/2016 1531   EOSABS 0.1  06/17/2016 1415   EOSABS 0.1 03/26/2016 1531   BASOSABS 0.0 06/17/2016 1415   BASOSABS 0.0 03/26/2016 1531   CMP Latest Ref Rng & Units 06/17/2016 03/26/2016 02/11/2016  Glucose 65 - 99 mg/dL 92 89 98  BUN 6 - 20 mg/dL 10 8.3 12  Creatinine 0.44 - 1.00 mg/dL 0.43(L) 0.7 0.58  Sodium 135 - 145 mmol/L 136 136 138  Potassium 3.5 - 5.1 mmol/L 3.8 4.0 3.8  Chloride 101 - 111 mmol/L 103 - 104  CO2 22 - 32 mmol/L 27 27 25   Calcium 8.9 - 10.3 mg/dL 9.5 9.4 9.4  Total Protein 6.5 - 8.1 g/dL 7.1 7.3 -  Total Bilirubin 0.3 - 1.2 mg/dL 0.8 0.71 -  Alkaline Phos 38 - 126 U/L 52 68 -  AST 15 - 41 U/L 14(L) 13 -  ALT 14 - 54 U/L 9(L) 11 -     Ref. Range 02/23/2016 14:03  TSH Latest Ref Range: 0.308 - 3.960 m(IU)/L 0.360    PENDING LABS:    DIAGNOSTIC IMAGING:  *The following radiologic images and reports have been reviewed independently and agree with below findings.  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 Treated with radiation therapy alone by Dr. Isidore Moos; completed treatment on 04/23/16.  -Continuing to recover from radiation. No clinical signs of recurrence on exam today.  -Restaging PET scan 08/17/16: Interval resolution of the left pharyngeal mass. No significant residual hypermetabolic activity or CT abnormality is observed in this location. No regional adenopathy or evidence of distant metastatic spread. -IR consult for PEG removal. -If in 3 months patient is still stable, will plan to remove chemoport.        Vision changes:  -Unclear etiology for the "flashing lights" she is experiencing in her peripheral vision. Refer to ophthalmologist.  *Note: When needing to communicate with patient via phone, we are asked to contact her daughter-in-law Joellen Jersey (who is bilingual) at: [phone]    Dispo:  -Maintain scheduled PET scan and  follow-up with Dr. Isidore Moos as scheduled.  -Return to cancer center in 3 months for continued follow-up.    All questions were answered to patient's stated satisfaction. Encouraged patient to call with any new concerns or questions before her next visit to the cancer center and we can certain see her sooner, if needed.    Plan of care discussed with Dr. Talbert Cage, who agrees with the above aforementioned.      Orders placed this encounter:  Orders Placed This Encounter  Procedures  . IR GASTROSTOMY TUBE REMOVAL/REPAIR   Twana First, MD

## 2016-10-01 NOTE — Progress Notes (Unsigned)
Referral made to [facility].  appt 9/25 @120 

## 2016-10-08 ENCOUNTER — Ambulatory Visit (HOSPITAL_COMMUNITY)
Admission: RE | Admit: 2016-10-08 | Discharge: 2016-10-08 | Disposition: A | Discharge status: Home / Self Care | Payer: Self-pay | Source: Ambulatory Visit | Attending: Oncology | Admitting: Oncology

## 2016-10-08 ENCOUNTER — Encounter (HOSPITAL_COMMUNITY): Payer: Self-pay | Admitting: Interventional Radiology

## 2016-10-08 DIAGNOSIS — C090 Malignant neoplasm of tonsillar fossa: Secondary | ICD-10-CM

## 2016-10-08 DIAGNOSIS — Z431 Encounter for attention to gastrostomy: Secondary | ICD-10-CM | POA: Insufficient documentation

## 2016-10-08 HISTORY — PX: IR GASTROSTOMY TUBE REMOVAL: IMG5492

## 2016-10-08 MED ORDER — LIDOCAINE VISCOUS 2 % MT SOLN
OROMUCOSAL | Status: AC
  Filled 2016-10-08: qty 15

## 2016-10-08 MED ORDER — LIDOCAINE VISCOUS 2 % MT SOLN
OROMUCOSAL | Status: DC | PRN
  Administered 2016-10-08: 5 mL via OROMUCOSAL

## 2016-10-08 MED FILL — FLUORISHIELD 1.1% GEL: 1.1 % | 30 days supply | Qty: 114 | Fill #1

## 2016-10-11 ENCOUNTER — Ambulatory Visit: Payer: Self-pay | Admitting: Physical Therapy

## 2016-10-14 ENCOUNTER — Ambulatory Visit: Payer: Self-pay | Attending: Radiation Oncology | Admitting: Physical Therapy

## 2016-10-14 DIAGNOSIS — R293 Abnormal posture: Secondary | ICD-10-CM | POA: Insufficient documentation

## 2016-10-14 DIAGNOSIS — I890 Lymphedema, not elsewhere classified: Secondary | ICD-10-CM | POA: Insufficient documentation

## 2016-10-14 DIAGNOSIS — L599 Disorder of the skin and subcutaneous tissue related to radiation, unspecified: Secondary | ICD-10-CM | POA: Insufficient documentation

## 2016-10-14 NOTE — Therapy (Signed)
[place], [place], [address] Phone: [phone]   Fax:  [phone]  Physical Therapy Treatment  Patient Details  Name: Heather Strickland MRN: [id number] Date of Birth: [birth year] Referring Provider: Dr. Eppie Gibson  Encounter Date: 10/14/2016      PT End of Session - 10/14/16 1641    Visit Number 4   Number of Visits 4   Date for PT Re-Evaluation 10/08/16   PT Start Time 8676  pt did not need full session    PT Stop Time 7209   PT Time Calculation (min) 29 min   Activity Tolerance Patient tolerated treatment well   Behavior During Therapy [facility] for tasks assessed/performed      Past Medical History:  Diagnosis Date  . Anemia   . GERD (gastroesophageal reflux disease)   . History of radiation therapy 03/08/2016 - 04/23/2016   Left Tonsil and Bilateral Neck  . HPV in female   . Squamous cell carcinoma of left tonsil ([place]) 01/27/2016    Past Surgical History:  Procedure Laterality Date  . CESAREAN SECTION  [id number]  . ESOPHAGOGASTRODUODENOSCOPY N/A 11/07/2015   Dr. Gala Romney: normal esophagus, chronic gastritis   . IR GASTROSTOMY TUBE REMOVAL  10/08/2016  . IR GENERIC HISTORICAL  02/11/2016   IR FLUORO GUIDE PORT INSERTION RIGHT 02/11/2016 Sandi Mariscal, MD [facility]  . IR GENERIC HISTORICAL  02/11/2016   IR US GUIDE VASC ACCESS RIGHT 02/11/2016 Sandi Mariscal, MD [facility]  . IR GENERIC HISTORICAL  02/11/2016   IR GASTROSTOMY TUBE MOD SED 02/11/2016 Sandi Mariscal, MD [facility]  . MULTIPLE EXTRACTIONS WITH ALVEOLOPLASTY N/A 02/20/2016   Procedure: Extraction of tooth #'s 15,17,18 and 32 with alveoloplasty and dental cleaning of teeth;  Surgeon: Lenn Cal, DDS;  Location: [facility];  Service: Oral Surgery;  Laterality: N/A;    There were no vitals filed for this visit.      Subjective Assessment - 10/14/16 1609    Subjective Pt states she has been doing well at home. She is doing her  manual lymph draiange every day and wearing her garment for about 2 hours every day. She says she is walking sometimes    Patient Stated Goals help reduce the neck so it can look normal because right now it looks tight and hard   Currently in Pain? No/denies               LYMPHEDEMA/ONCOLOGY QUESTIONNAIRE - 10/14/16 1611      Head and Neck   4 cm superior to sternal notch around neck 35 cm   6 cm superior to sternal notch around neck 34.5 cm   8 cm superior to sternal notch around neck 35.4 cm                  [facility] Adult PT Treatment/Exercise - 10/14/16 0001      Self-Care   Self-Care Other Self-Care Comments   Other Self-Care Comments  reviewed neck and scapular exercise and encouraged walking program. Gave pt flyer from [place] and she will call to see if there is a program in [place]      Manual Therapy   Edema Management remeasured neck circumference and reviewed self manual lymph drainage                         Long Term Clinic Goals - 10/14/16 1643      CC Long Term Goal  #  1   Title Pt. will be independent in performing manual lymph drainage for her neck.   Status Achieved     CC Long Term Goal  #2   Title Pt. will know how and where to obtain manufactured neck compression garment.   Baseline Donated new garment issued  to her.   Status Achieved            Plan - 10/14/16 1642    Clinical Impression Statement Pt continues to have some circumference reduction especially at 8 cm superior to sternal notch.  Pt states she is doing self manual lymph drainage and wearing compression and plans to keep exercising    PT Next Visit Plan discharge this episode    Consulted and Agree with Plan of Care Patient      Patient will benefit from skilled therapeutic intervention in order to improve the following deficits and impairments:     Visit Diagnosis: Lymphedema, not elsewhere classified  Disorder of the skin and subcutaneous tissue  related to radiation, unspecified  Abnormal posture     Problem List Patient Active Problem List   Diagnosis Date Noted  . Port catheter in place 04/22/2016  . 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 Oral candidiasis 03/30/2016  . Weight loss 03/30/2016  . Hemoptysis 03/11/2016  . Cancer of tonsillar fossa ([place]) 01/27/2016  . GERD (gastroesophageal reflux disease) 12/22/2015  . H. pylori infection 07/21/2015  . Esophageal reflux 06/19/2015  . Prediabetes 06/19/2015  . Obesity, unspecified 06/19/2015  . Absolute anemia 06/19/2015   PHYSICAL THERAPY DISCHARGE SUMMARY  Visits from Start of Care: 4  Current functional level related to goals / functional outcomes: As above    Remaining deficits: none   Education / Equipment: Self manual lymph drainage and exercise program  Plan: Patient agrees to discharge.  Patient goals were met. Patient is being discharged due to not returning since the last visit.  ?????    Donato Heinz. Owens Shark PT  Norwood Levo 10/14/2016, 4:44 PM  [place], [place], [address] Phone: [phone]   Fax:  [phone]  Name: Heather Strickland MRN: [id number] Date of Birth: [birth year]

## 2016-10-25 ENCOUNTER — Ambulatory Visit: Payer: Self-pay | Admitting: Physician Assistant

## 2016-11-25 ENCOUNTER — Ambulatory Visit (INDEPENDENT_AMBULATORY_CARE_PROVIDER_SITE_OTHER): Payer: Self-pay | Admitting: Otolaryngology

## 2016-11-25 DIAGNOSIS — Z85819 Personal history of malignant neoplasm of unspecified site of lip, oral cavity, and pharynx: Secondary | ICD-10-CM

## 2016-12-13 DIAGNOSIS — R1312 Dysphagia, oropharyngeal phase: Secondary | ICD-10-CM

## 2016-12-13 NOTE — Therapy (Signed)
[facility] [address] [place], [place], [address] Phone: [phone]   Fax:  [phone]  Patient Details  Name: Heather Strickland MRN: [id number] Date of Birth: [birth year] Referring Provider:  No ref. provider found  Encounter Date: 12/13/2016  SPEECH THERAPY DISCHARGE SUMMARY  Visits from Start of Care: one (eval)  Current functional level related to goals / functional outcomes: Pt did not schedule follow up appointments.  No goals were met.   Remaining deficits: All deficits assumed remaining.   Education / Equipment: HEP, late effects head/neck radiation upon swallowing.  Plan: Patient agrees to discharge.  Patient goals were not met. Patient is being discharged due to not returning since the last visit.  ?????     . Garald Balding ,[place], CCC-SLP  12/13/2016, 1:55 PM  [name] [address] [place], [place], [address] Phone: [phone]   Fax:  [phone]

## 2016-12-28 ENCOUNTER — Encounter (HOSPITAL_COMMUNITY): Payer: Self-pay | Admitting: Oncology

## 2016-12-28 ENCOUNTER — Encounter (HOSPITAL_COMMUNITY): Payer: Self-pay | Attending: Oncology | Admitting: Oncology

## 2016-12-28 ENCOUNTER — Other Ambulatory Visit: Payer: Self-pay

## 2016-12-28 VITALS — BP 119/77 | HR 70 | Temp 97.5°F | Resp 16 | Ht 62.0 in | Wt 137.6 lb

## 2016-12-28 DIAGNOSIS — C090 Malignant neoplasm of tonsillar fossa: Secondary | ICD-10-CM

## 2016-12-28 DIAGNOSIS — C760 Malignant neoplasm of head, face and neck: Secondary | ICD-10-CM

## 2016-12-28 DIAGNOSIS — J359 Chronic disease of tonsils and adenoids, unspecified: Secondary | ICD-10-CM

## 2016-12-28 NOTE — Progress Notes (Signed)
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      01/15/2016 Procedure    Left tonsil biopsy by Dr. Benjamine Mola      01/15/2016 Initial Diagnosis    She presented to Dr. Benjamine Mola after being referred by [name], NP (GI) with a 5-6 month history of severe sore throat.  The patient never sought treatment for this issue previously.  Over the last 3 months, patient reported intermittent bleeding from left tonsil.  She also feels a hard mass along the left side of neck.  On physical exam by Dr. Benjamine Mola, he noted 3+ ulceration and erythema on the left tonsil.  On flexible laryngoscopy, tonsillar hypertrophy with ulceration was noted on the left.      01/19/2016 Pathology Results    Invasive squamous cell carcinoma, poorly differentiated.      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 Fine soft tissue details lost at these levels.  Pharyngeal mass centered in the left palatini tonsil with surround mucosal thickening as described below probably representing neoplasm.  Mucosal thickening of the left aspect of hypopharynx and pharynx with inferior most extension to the margin of the supraglottic airway without appreciable invasion into epiglottis, aryepiglottic folds, vocal cords, or paraglottic fat.  Superior most extension of mucosal thickening to the left aspect of the uvula and along the left aspect of the soft palate to the hard/soft palate  junction, this region is obscured by motion artifact.  Anteriorly there is effacement of left glossopharyngeal sulcus without appreciable tongue base invasion, this region is obscured by motion artifact.  Asymmetric enlargement of left-sided level 2 and 3 cervical lymph nodes possibly metastatic. No evidence for lymph node necrosis or extra nodal extension.      01/29/2016 Imaging    CT chest-  No evidence of metastatic disease in the chest.      02/06/2016 PET scan    1. Highly hypermetabolic left palatine tonsillar mass, maximum SUV 38.5. 2. A left station IIa lymph node measure 1.1 cm in short axis, mildly enlarged on image 38/3, but has only a borderline elevated SUV at 3.5. 3. Other imaging findings of potential clinical significance: Mild cardiomegaly. Calcified granuloma in the superior segment right lower lobe (benign). IUD satisfactorily positioned in the uterus.      02/11/2016 Procedure    Successful fluoroscopic insertion of a 20-French pull-through gastrostomy tube.      02/11/2016 Procedure    Successful placement of a right internal jugular approach power injectable Port-A-Cath. The catheter is ready for immediate use.      03/08/2016 - 04/23/2016 Radiation Therapy    Radiation alone Isidore Moos). Left Tonsil and Bilateral neck / 70 Gy in 35 fractions to gross disease, 63 Gy in 35 fractions to high risk nodal echelons, and 56 Gy in 35 fractions to intermediate risk nodal echelons      10/08/2016 Procedure  Pull-through gastrostomy tube removed.        INTERVAL HISTORY:  Heather Strickland presents for routine follow-up since completing definitive radiation therapy for left tonsil cancer.   Patient presents today for follow up. Professional Spanish translator is here with her today. She states that she has been doing well. Patient states that she went to see Dr. Benjamine Mola last month and had a DL and no evidence of disease recurrence was found. Patient is still  having the flashing white lights occasionally in her peripheral vision. She had an appt with ophthalmology but she missed it.  She states that she is not having any dysphasia although states that she does feel that she has dry mouth and needs to drink more liquids when she eats.  She denies any chest pain, shortness of breath, abdominal pain, nausea, vomiting, diarrhea, focal weakness.  She has not had any recent infections.  REVIEW OF SYSTEMS:  Review of Systems  Constitutional: Positive for fatigue.  HENT:   Negative for sore throat.   Eyes: Positive for eye problems.  Respiratory: Negative.  Negative for cough and shortness of breath.   Cardiovascular: Negative.  Negative for chest pain and leg swelling.  Gastrointestinal: Negative.  Negative for abdominal pain, blood in stool, constipation, diarrhea, nausea and vomiting.  Endocrine: Negative.   Genitourinary: Negative for dysuria and hematuria.   Musculoskeletal: Negative for gait problem and neck pain.  Neurological: Negative.  Negative for dizziness, extremity weakness, gait problem, headaches, light-headedness and speech difficulty.  Hematological: Negative.   Psychiatric/Behavioral: Negative.      PAST MEDICAL/SURGICAL HISTORY:  Past Medical History:  Diagnosis Date  . Anemia   . GERD (gastroesophageal reflux disease)   . History of radiation therapy 03/08/2016 - 04/23/2016   Left Tonsil and Bilateral Neck  . HPV in female   . Squamous cell carcinoma of left tonsil ([place]) 01/27/2016   Past Surgical History:  Procedure Laterality Date  . CESAREAN SECTION  [id number]  . ESOPHAGOGASTRODUODENOSCOPY N/A 11/07/2015   Dr. Gala Romney: normal esophagus, chronic gastritis   . IR GASTROSTOMY TUBE REMOVAL  10/08/2016  . IR GENERIC HISTORICAL  02/11/2016   IR FLUORO GUIDE PORT INSERTION RIGHT 02/11/2016 Sandi Mariscal, MD [facility]  . IR GENERIC HISTORICAL  02/11/2016   IR US GUIDE VASC ACCESS RIGHT 02/11/2016 Sandi Mariscal, MD [facility]  . IR  GENERIC HISTORICAL  02/11/2016   IR GASTROSTOMY TUBE MOD SED 02/11/2016 Sandi Mariscal, MD [facility]  . MULTIPLE EXTRACTIONS WITH ALVEOLOPLASTY N/A 02/20/2016   Procedure: Extraction of tooth #'s 15,17,18 and 32 with alveoloplasty and dental cleaning of teeth;  Surgeon: Lenn Cal, DDS;  Location: [facility];  Service: Oral Surgery;  Laterality: N/A;     SOCIAL HISTORY:  Social History   Socioeconomic History  . Marital status: Married    Spouse name: Not on file  . Number of children: 4  . Years of education: Not on file  . Highest education level: Not on file  Social Needs  . Financial resource strain: Not on file  . Food insecurity - worry: Not on file  . Food insecurity - inability: Not on file  . Transportation needs - medical: Not on file  . Transportation needs - non-medical: Not on file  Occupational History  . Not on file  Tobacco Use  . Smoking status: Never Smoker  . Smokeless tobacco: Never Used  Substance and Sexual Activity  . Alcohol use: No  . Drug use: No  .  Sexual activity: Not Currently    Birth control/protection: IUD  Other Topics Concern  . Not on file  Social History Narrative  . Not on file    FAMILY HISTORY:  Family History  Problem Relation Age of Onset  . Diabetes Mother   . Hypertension Mother   . Prostatitis Father   . ADD / ADHD Daughter   . Cerebral palsy Son     CURRENT MEDICATIONS:  Outpatient Encounter Medications as of 12/28/2016  Medication Sig  . ferrous sulfate 325 (65 FE) MG tablet Take 1 tablet (325 mg total) by mouth daily.  Marland Kitchen levonorgestrel (MIRENA) 20 MCG/24HR IUD 1 each by Intrauterine route once.  . Multiple Vitamins-Minerals (MULTIVITAMIN WITH MINERALS) tablet Take 1 tablet by mouth daily.  . pantoprazole (PROTONIX) 40 MG tablet Take 1 tablet (40 mg total) by mouth 2 (two) times daily before a meal. Tome una tableta por boca diaria (Patient taking differently: Take 40 mg by mouth daily. Tome una tableta por boca diaria)   . sodium fluoride (FLUORISHIELD) 1.1 % GEL dental gel Instill one drop of gel per tooth space of fluoride tray. Place over teeth for 5 minutes. Remove. Spit out excess. Repeat nightly.  . sodium fluoride (PREVIDENT 5000 PLUS) 1.1 % CREA dental cream Apply thin ribbon of cream to tooth brush. Brush teeth for 2 minutes. Spit out excess-DO NOT swallow. DO NOT rinse afterwards. Repeat nightly.   No facility-administered encounter medications on file as of 12/28/2016.     ALLERGIES:  Allergies  Allergen Reactions  . No Known Allergies      PHYSICAL EXAM:  ECOG Performance status: 1 - Symptomatic; largely independent.   Vitals:   12/28/16 1500  BP: 119/77  Pulse: 70  Resp: 16  Temp: (!) 97.5 F (36.4 C)  SpO2: 99%   Filed Weights   12/28/16 1500  Weight: 137 lb 9.6 oz (62.4 kg)     Physical Exam  Constitutional: She is oriented to person, place, and time and well-developed, well-nourished, and in no distress.  HENT:  Head: Normocephalic.  Mouth/Throat: Oropharynx is clear and moist.  Eyes: Conjunctivae are normal. Pupils are equal, round, and reactive to light. No scleral icterus.  Neck: Normal range of motion. Neck supple.  Cardiovascular: Normal rate and regular rhythm.  Pulmonary/Chest: Effort normal and breath sounds normal. No respiratory distress. She has no wheezes. She has no rales.  Abdominal: Soft. Bowel sounds are normal. She exhibits no distension. There is no tenderness. There is no rebound.  Musculoskeletal: Normal range of motion. She exhibits no edema.  Lymphadenopathy:    She has no cervical adenopathy.  Neurological: She is alert and oriented to person, place, and time. She has normal sensation, normal strength and intact cranial nerves. No cranial nerve deficit. Gait normal.  Skin: Skin is warm and dry. No rash noted.  Psychiatric: Mood, memory, affect and judgment normal.  Nursing note and vitals reviewed.    LABORATORY DATA:  I have reviewed the  labs as listed.  CBC    Component Value Date/Time   WBC 3.9 (L) 06/17/2016 1415   RBC 3.77 (L) 06/17/2016 1415   HGB 11.8 (L) 06/17/2016 1415   HGB 11.4 (L) 03/26/2016 1531   HCT 34.4 (L) 06/17/2016 1415   HCT 33.4 (L) 03/26/2016 1531   PLT 214 06/17/2016 1415   PLT 213 03/26/2016 1531   MCV 91.2 06/17/2016 1415   MCV 90.8 03/26/2016 1531   MCH 31.3 06/17/2016 1415   MCHC  34.3 06/17/2016 1415   RDW 13.1 06/17/2016 1415   RDW 13.1 03/26/2016 1531   LYMPHSABS 0.6 (L) 06/17/2016 1415   LYMPHSABS 0.5 (L) 03/26/2016 1531   MONOABS 0.3 06/17/2016 1415   MONOABS 0.3 03/26/2016 1531   EOSABS 0.1 06/17/2016 1415   EOSABS 0.1 03/26/2016 1531   BASOSABS 0.0 06/17/2016 1415   BASOSABS 0.0 03/26/2016 1531   CMP Latest Ref Rng & Units 06/17/2016 03/26/2016 02/11/2016  Glucose 65 - 99 mg/dL 92 89 98  BUN 6 - 20 mg/dL 10 8.3 12  Creatinine 0.44 - 1.00 mg/dL 0.43(L) 0.7 0.58  Sodium 135 - 145 mmol/L 136 136 138  Potassium 3.5 - 5.1 mmol/L 3.8 4.0 3.8  Chloride 101 - 111 mmol/L 103 - 104  CO2 22 - 32 mmol/L 27 27 25   Calcium 8.9 - 10.3 mg/dL 9.5 9.4 9.4  Total Protein 6.5 - 8.1 g/dL 7.1 7.3 -  Total Bilirubin 0.3 - 1.2 mg/dL 0.8 0.71 -  Alkaline Phos 38 - 126 U/L 52 68 -  AST 15 - 41 U/L 14(L) 13 -  ALT 14 - 54 U/L 9(L) 11 -     PENDING LABS:    DIAGNOSTIC IMAGING:  *The following radiologic images and reports have been reviewed independently and agree with below findings.  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 Treated with radiation therapy alone by Dr. Isidore Moos; completed treatment on 04/23/16.  -Restaging PET scan 08/17/16: Interval resolution of the left pharyngeal mass. No significant residual hypermetabolic activity or CT abnormality is observed in this location. No regional adenopathy or evidence of distant metastatic  spread. -Clinically NED on exam today.  -IR consult for removal of chemoport. -Will order restaging CT neck/chest to be done in 3 months prior to her next visit. -Continue fu with rad-onc, Dr. Isidore Moos, at scheduled appt in Feb 2019. -Continue fu with ENT, Dr. Benjamine Mola.     Vision changes:  -Recommended for patient to reschedule the ophthalmology appt.  *Note: When needing to communicate with patient via phone, we are asked to contact her daughter-in-law Joellen Jersey (who is bilingual) at: [phone]    Dispo:  -Return to cancer center in 3 months for continued follow-up and to review restaging scans.     All questions were answered to patient's stated satisfaction. Encouraged patient to call with any new concerns or questions before her next visit to the cancer center and we can certain see her sooner, if needed.    Orders placed this encounter:  Orders Placed This Encounter  Procedures  . CT Chest W Contrast  . CT SOFT TISSUE NECK W CONTRAST  . IR Removal Tun Cv Cath W/O FL  . CBC with Differential  . Comprehensive metabolic panel   Twana First, MD

## 2017-01-07 ENCOUNTER — Other Ambulatory Visit (HOSPITAL_COMMUNITY): Payer: Self-pay | Admitting: Adult Health

## 2017-01-07 ENCOUNTER — Encounter (HOSPITAL_COMMUNITY): Payer: Self-pay | Admitting: Adult Health

## 2017-01-07 DIAGNOSIS — C090 Malignant neoplasm of tonsillar fossa: Secondary | ICD-10-CM

## 2017-01-07 DIAGNOSIS — H539 Unspecified visual disturbance: Secondary | ICD-10-CM

## 2017-01-07 NOTE — Progress Notes (Unsigned)
Received phone call from Dr. Isidore Moos with radiation oncology in [place].  She received call from pt's ophthalmologist that there was no ophthalmic etiology on pt's exam for the bright flashing lights.    Dr. Isidore Moos recommends MRI brain and/or referral to Dr. Mickeal Skinner with neuro-oncology at the cancer center in [place]. Dr. Isidore Moos shared with me that she reviewed the patient's previous radiation treatment and she did not receive optic nerve radiation exposure that would warrant the type of symptoms (flashing lights) that pt has been experiencing.    Will order MRI brain for further evaluation.  We have low clinical suspicion that MRI brain will reveal oncologic etiology for her vision symptoms, but given her h/o H&N cancer, will obtain additional imaging.    Orders placed today; message sent to scheduling to help make arrangements to get MRI brain done when pt is able.

## 2017-01-10 ENCOUNTER — Other Ambulatory Visit: Payer: Self-pay | Admitting: General Surgery

## 2017-01-12 ENCOUNTER — Other Ambulatory Visit (HOSPITAL_COMMUNITY): Payer: Self-pay | Admitting: Oncology

## 2017-01-12 ENCOUNTER — Encounter (HOSPITAL_COMMUNITY): Payer: Self-pay

## 2017-01-12 ENCOUNTER — Ambulatory Visit (HOSPITAL_COMMUNITY)
Admission: RE | Admit: 2017-01-12 | Discharge: 2017-01-12 | Disposition: A | Discharge status: Home / Self Care | Payer: Self-pay | Source: Ambulatory Visit | Attending: Oncology | Admitting: Oncology

## 2017-01-12 DIAGNOSIS — C090 Malignant neoplasm of tonsillar fossa: Secondary | ICD-10-CM

## 2017-01-12 DIAGNOSIS — Z452 Encounter for adjustment and management of vascular access device: Secondary | ICD-10-CM | POA: Insufficient documentation

## 2017-01-12 DIAGNOSIS — Z85818 Personal history of malignant neoplasm of other sites of lip, oral cavity, and pharynx: Secondary | ICD-10-CM | POA: Insufficient documentation

## 2017-01-12 HISTORY — PX: IR REMOVAL TUN ACCESS W/ PORT W/O FL MOD SED: IMG2290

## 2017-01-12 LAB — CBC
HEMATOCRIT: 37.9 % (ref 36.0–46.0)
HEMOGLOBIN: 12.9 g/dL (ref 12.0–15.0)
MCH: 32.3 pg (ref 26.0–34.0)
MCHC: 34 g/dL (ref 30.0–36.0)
MCV: 94.8 fL (ref 78.0–100.0)
Platelets: 213 10*3/uL (ref 150–400)
RBC: 4 MIL/uL (ref 3.87–5.11)
RDW: 13.1 % (ref 11.5–15.5)
WBC: 4.2 10*3/uL (ref 4.0–10.5)

## 2017-01-12 LAB — PROTIME-INR
INR: 1.1
Prothrombin Time: 14.1 seconds (ref 11.4–15.2)

## 2017-01-12 MED ORDER — LIDOCAINE-EPINEPHRINE (PF) 2 %-1:200000 IJ SOLN
INTRAMUSCULAR | Status: AC
  Filled 2017-01-12: qty 20

## 2017-01-12 MED ORDER — LIDOCAINE HCL 1 % IJ SOLN
INTRAMUSCULAR | Status: AC
  Filled 2017-01-12: qty 20

## 2017-01-12 MED ORDER — FENTANYL CITRATE (PF) 100 MCG/2ML IJ SOLN
INTRAMUSCULAR | Status: AC | PRN
  Administered 2017-01-12 (×2): 25 ug via INTRAVENOUS
  Administered 2017-01-12: 50 ug via INTRAVENOUS

## 2017-01-12 MED ORDER — SODIUM CHLORIDE 0.9 % IV SOLN
INTRAVENOUS | Status: DC
  Administered 2017-01-12: 10:00:00 via INTRAVENOUS

## 2017-01-12 MED ORDER — CEFAZOLIN SODIUM-DEXTROSE 2-4 GM/100ML-% IV SOLN
INTRAVENOUS | Status: AC
  Filled 2017-01-12: qty 100

## 2017-01-12 MED ORDER — FENTANYL CITRATE (PF) 100 MCG/2ML IJ SOLN
INTRAMUSCULAR | Status: AC
Start: 2017-01-12 — End: 2017-01-12
  Filled 2017-01-12: qty 2

## 2017-01-12 MED ORDER — MIDAZOLAM HCL 2 MG/2ML IJ SOLN
INTRAMUSCULAR | Status: AC
  Filled 2017-01-12: qty 4

## 2017-01-12 MED ORDER — LIDOCAINE-EPINEPHRINE (PF) 2 %-1:200000 IJ SOLN
INTRAMUSCULAR | Status: AC | PRN
  Administered 2017-01-12: 10 mL

## 2017-01-12 MED ORDER — MIDAZOLAM HCL 2 MG/2ML IJ SOLN
INTRAMUSCULAR | Status: AC | PRN
  Administered 2017-01-12 (×2): 1 mg via INTRAVENOUS

## 2017-01-12 MED ORDER — CEFAZOLIN SODIUM-DEXTROSE 2-4 GM/100ML-% IV SOLN
2.0000 g | INTRAVENOUS | Status: AC
  Administered 2017-01-12: 2 g via INTRAVENOUS

## 2017-01-12 NOTE — Discharge Instructions (Signed)
Sedacin consciente [employer] adultos, cuidados posteriores (Moderate Conscious Sedation, Adult, Care After) Estas indicaciones le proporcionan informacin acerca de cmo deber cuidarse despus del procedimiento. El mdico tambin podr darle instrucciones ms especficas. El tratamiento ha sido planificado segn las prcticas mdicas actuales, pero en algunos casos pueden ocurrir problemas. Comunquese con el mdico si tiene algn problema o dudas despus del procedimiento. QU ESPERAR DESPUS DEL PROCEDIMIENTO Despus del procedimiento, es comn:  Sentirse somnoliento durante varias horas.  Sentirse torpe y [employer] de equilibrio durante varias horas.  Perder el sentido de la realidad durante varias horas.  Vomitar si come [employer]. INSTRUCCIONES PARA EL CUIDADO EN EL HOGAR Durante al menos 24horas despus del procedimiento:  No haga lo siguiente: ? Participar en actividades que impliquen posibles cadas o lesiones. ? Conducir vehculos. ? Operar maquinarias pesadas. ? Beber alcohol. ? Tomar somnferos o medicamentos que causen somnolencia. ? Firmar documentos legales ni tomar [employer]. ? Cuidar a nios por su cuenta.  Hacer reposo. Comida y bebida  Siga la dieta recomendada por el mdico.  Si vomita: ? Pruebe agua, jugo o sopa cuando usted pueda beber sin vomitar. ? Asegrese de no tener nuseas antes de ingerir alimentos slidos. Instrucciones generales  Permanezca con un adulto responsable hasta que est completamente despierto y consciente.  Tome los medicamentos de venta libre y los recetados solamente como se lo haya indicado el mdico.  Si fuma, no lo haga sin supervisin.  Concurra a todas las visitas de control como se lo haya indicado el mdico. Esto es importante. SOLICITE ATENCIN MDICA SI:  Sigue teniendo nuseas o vomitando.  Tiene sensacin de desvanecimiento.  Le aparece una erupcin cutnea.  Tiene fiebre. SOLICITE  ATENCIN MDICA DE INMEDIATO SI:  Tiene dificultad para respirar. Esta informacin no tiene Marine scientist el consejo del mdico. Asegrese de hacerle al mdico cualquier pregunta que tenga. Document Released: 01/02/2013 Document Revised: 01/18/2014 Document Reviewed: 04/19/2015 Elsevier Interactive Patient Education  2018 [employer]. Extraccin del dispositivo de perfusin implantable (Implanted Port Removal) En este procedimiento, se extraen el dispositivo de perfusin y el catter (Port-a-Cath) que se han implantado debajo de la piel. El dispositivo de perfusin es un pequeo disco subcutneo que puede pincharse con [place]. Se conecta con una vena del pecho o el cuello mediante una pequea sonda flexible (catter). El dispositivo de perfusin y el catter se utilizan para Architectural technologist un tratamiento a travs de una va intravenosa (IV) y para extraer muestras de [place]. El mdico retirar el dispositivo de perfusin y el catter en cualquiera de los siguientes casos:  Usted ya no los necesita para Dispensing optician.  No funcionan correctamente.  El rea que los rodea se [place]. INFORME A SU MDICO:  Cualquier alergia que tenga.  Todos los [employer], incluidos vitaminas, hierbas, gotas oftlmicas, cremas y medicamentos de venta libre.  Problemas previos que usted o los [employer] de su familia hayan tenido con el uso de anestsicos.  Enfermedades de la sangre que tenga.  Si tiene cirugas previas.  Cualquier enfermedad que tenga.  Si est embarazada o podra estarlo. RIESGOS Y COMPLICACIONES En general, se trata de un procedimiento seguro. Sin embargo, pueden ocurrir complicaciones, por ejemplo:  Infeccin.  Hemorragia.  Reacciones alrgicas a la anestesia.  Lesin en los nervios o los vasos sanguneos. ANTES DEL PROCEDIMIENTO  Le harn lo siguiente: ? Un examen fsico. ? Anlisis de [place]. ? Estudios de diagnstico por imgenes, que incluyen una  radiografa torcica.  Siga las indicaciones del mdico respecto de las restricciones para las comidas o las bebidas.  Consulte al mdico si debe hacer o no lo siguiente: ? Cambiar o suspender los medicamentos que toma habitualmente. Esto es muy importante si toma medicamentos para la diabetes o anticoagulantes. ? Tomar medicamentos como aspirina e ibuprofeno. Estos medicamentos pueden tener un efecto anticoagulante en la [place]. No tome estos medicamentos antes del procedimiento si el cirujano le indica que no lo haga.  Pregntele al mdico cmo se Scientist, clinical (histocompatibility and immunogenetics) o se Museum/gallery curator de la Leisure centre manager.  Pueden darle antibiticos para ayudar a prevenir las infecciones.  Haga planes para que una persona lo lleve de vuelta a su casa despus del procedimiento.  Si se ir a su casa inmediatamente despus del procedimiento, planifique que alguien se quede con usted durante 24horas. PROCEDIMIENTO  Para reducir el riesgo de infecciones: ? El equipo mdico se lavar o se desinfectar las manos. ? Le lavarn la piel con jabn.  Pueden administrarle uno o ms de los siguientes medicamentos: ? Un medicamento para ayudarlo a relajarse (sedante). ? Un medicamento para adormecer la zona (anestesia local).  Le harn un pequeo corte (incisin) en el lugar del dispositivo de perfusin y Print production planner.  El dispositivo de perfusin y el catter que estaba dentro de la vena se extraern con cuidado.  La incisin se cerrar con puntos (suturas), tiras Mike Craze para la piel.  Se colocar una venda (vendaje) sobre la incisin. Este procedimiento puede variar segn el mdico y el hospital. DESPUS DEL PROCEDIMIENTO  Marin Comment controlarn con frecuencia la presin arterial, la frecuencia cardaca, la frecuencia respiratoria y Retail buyer de oxgeno en la sangre hasta que haya desaparecido el efecto de los medicamentos administrados.  No conduzca durante 24horas si le administraron un sedante. Esta  informacin no tiene Marine scientist el consejo del mdico. Asegrese de hacerle al mdico cualquier pregunta que tenga. Document Released: 04/21/2015 Document Revised: 04/21/2015 Document Reviewed: 10/02/2014 Elsevier Interactive Patient Education  [employer].

## 2017-01-12 NOTE — Sedation Documentation (Signed)
Patient denies pain and is resting comfortably.  

## 2017-01-12 NOTE — Sedation Documentation (Signed)
Patient is resting comfortably. 

## 2017-01-12 NOTE — H&P (Signed)
Chief Complaint: tonsillar carcinoma   Referring Physician:Dr. Twana First  Supervising Physician: Daryll Brod  Patient Status: [facility] - Out-pt  HPI: Heather Strickland is a 42 y.o. female with a history of stage I tonsillar cancer who has completed treatment and presents today for removal of her PAC.  She has no complaints and is doing well.  Past Medical History:  Past Medical History:  Diagnosis Date  . Anemia   . GERD (gastroesophageal reflux disease)   . History of radiation therapy 03/08/2016 - 04/23/2016   Left Tonsil and Bilateral Neck  . HPV in female   . Squamous cell carcinoma of left tonsil ([place]) 01/27/2016    Past Surgical History:  Past Surgical History:  Procedure Laterality Date  . CESAREAN SECTION  [id number]  . ESOPHAGOGASTRODUODENOSCOPY N/A 11/07/2015   Dr. Gala Romney: normal esophagus, chronic gastritis   . IR GASTROSTOMY TUBE REMOVAL  10/08/2016  . IR GENERIC HISTORICAL  02/11/2016   IR FLUORO GUIDE PORT INSERTION RIGHT 02/11/2016 Sandi Mariscal, MD [facility]INTER[facility]  . IR GENERIC HISTORICAL  02/11/2016   IR US GUIDE VASC ACCESS RIGHT 02/11/2016 Sandi Mariscal, MD [facility]  . IR GENERIC HISTORICAL  02/11/2016   IR GASTROSTOMY TUBE MOD SED 02/11/2016 Sandi Mariscal, MD [facility]  . MULTIPLE EXTRACTIONS WITH ALVEOLOPLASTY N/A 02/20/2016   Procedure: Extraction of tooth #'s 15,17,18 and 32 with alveoloplasty and dental cleaning of teeth;  Surgeon: Lenn Cal, DDS;  Location: [place];  Service: Oral Surgery;  Laterality: N/A;    Family History:  Family History  Problem Relation Age of Onset  . Diabetes Mother   . Hypertension Mother   . Prostatitis Father   . ADD / ADHD Daughter   . Cerebral palsy Son     Social History:  reports that  has never smoked. she has never used smokeless tobacco. She reports that she does not drink alcohol or use drugs.  Allergies:  Allergies  Allergen Reactions  . No Known Allergies     Medications: Medications reviewed in  epic  Please HPI for pertinent positives, otherwise complete 10 system ROS negative.  Mallampati Score: MD Evaluation Airway: WNL Heart: WNL Abdomen: WNL Chest/ Lungs: WNL ASA  Classification: 2 Mallampati/Airway Score: Two  Physical Exam: BP 119/71 (BP Location: Right Arm)   Pulse 69   Temp 98.4 F (36.9 C) (Oral)   Resp 16   SpO2 100%  There is no height or weight on file to calculate BMI. General: pleasant, WD, WN Hispanic female who is laying in bed in NAD HEENT: head is normocephalic, atraumatic.  Sclera are noninjected.  PERRL.  Ears and nose without any masses or lesions.  Mouth is pink and moist Heart: regular, rate, and rhythm.  Normal s1,s2. No obvious murmurs, gallops, or rubs noted.  Palpable radial and pedal pulses bilaterally Lungs: CTAB, no wheezes, rhonchi, or rales noted.  Respiratory effort nonlabored Abd: soft, NT, ND, +BS, no masses, hernias, or organomegaly Psych: A&Ox3 with an appropriate affect.   Labs: Results for orders placed or performed during the hospital encounter of 01/12/17 (from the past 48 hour(s))  CBC     Status: None   Collection Time: 01/12/17  9:53 AM  Result Value Ref Range   WBC 4.2 4.0 - 10.5 K/uL   RBC 4.00 3.87 - 5.11 MIL/uL   Hemoglobin 12.9 12.0 - 15.0 g/dL   HCT 37.9 36.0 - 46.0 %   MCV 94.8 78.0 - 100.0 fL  MCH 32.3 26.0 - 34.0 pg   MCHC 34.0 30.0 - 36.0 g/dL   RDW 13.1 11.5 - 15.5 %   Platelets 213 150 - 400 K/uL  Protime-INR     Status: None   Collection Time: 01/12/17  9:53 AM  Result Value Ref Range   Prothrombin Time 14.1 11.4 - 15.2 seconds   INR 1.10     Imaging: No results found.  Assessment/Plan 1. Tonsillar carcinoma  The patient has completed treatment.  She presents today for PAC removal.  Labs and vitals have been reviewed.  The procedure, risks/complications including, but not limited to bleeding and infection have been discussed with the patient.  She understands and is agreeable to proceed.  This  was all done via in person interpretor.    Thank you for this interesting consult.  I greatly enjoyed meeting Heather Strickland and look forward to participating in their care.  A copy of this report was sent to the requesting provider on this date.  Electronically Signed: Henreitta Cea 01/12/2017, 11:28 AM   I spent a total of  30 Minutes   in face to face in clinical consultation, greater than 50% of which was counseling/coordinating care for tonsillar carcinoma

## 2017-01-12 NOTE — Progress Notes (Signed)
Pt signed consent for interpreter to be used. Upon using personal interpreter, there was a barrier for medical language. This nurse used the portable translator Neill Loft [phone]) to complete check-in for patient.

## 2017-01-12 NOTE — Procedures (Signed)
TONSILLAR CA  S/P RT IJ PORT REMOVAL  No comp Stable ebl 0 Full report in pacs

## 2017-01-14 ENCOUNTER — Ambulatory Visit (HOSPITAL_COMMUNITY)
Admission: RE | Admit: 2017-01-14 | Discharge: 2017-01-14 | Disposition: A | Discharge status: Home / Self Care | Payer: Self-pay | Source: Ambulatory Visit | Attending: Adult Health | Admitting: Adult Health

## 2017-01-14 DIAGNOSIS — R9082 White matter disease, unspecified: Secondary | ICD-10-CM | POA: Insufficient documentation

## 2017-01-14 DIAGNOSIS — H539 Unspecified visual disturbance: Secondary | ICD-10-CM | POA: Insufficient documentation

## 2017-01-14 DIAGNOSIS — C090 Malignant neoplasm of tonsillar fossa: Secondary | ICD-10-CM | POA: Insufficient documentation

## 2017-01-14 MED ORDER — GADOBENATE DIMEGLUMINE 529 MG/ML IV SOLN
12.0000 mL | Freq: Once | INTRAVENOUS | Status: AC | PRN
  Administered 2017-01-14: 12 mL via INTRAVENOUS

## 2017-01-31 ENCOUNTER — Encounter: Payer: Self-pay | Admitting: Physician Assistant

## 2017-01-31 ENCOUNTER — Ambulatory Visit: Payer: Self-pay | Admitting: Physician Assistant

## 2017-01-31 VITALS — BP 116/70 | HR 77 | Temp 98.2°F | Ht 62.0 in | Wt 139.2 lb

## 2017-01-31 DIAGNOSIS — N939 Abnormal uterine and vaginal bleeding, unspecified: Secondary | ICD-10-CM

## 2017-01-31 NOTE — Progress Notes (Signed)
BP 116/70 (BP Location: Left Arm, Patient Position: Sitting, Cuff Size: Normal)   Pulse 77   Temp 98.2 F (36.8 C)   Ht 5\' 2"  (1.575 m)   Wt 139 lb 4 oz (63.2 kg)   SpO2 98%   BMI 25.47 kg/m    Subjective:    Patient ID: Heather Strickland, female    DOB: Nov 09, 1975, 43 y.o.   MRN: [id number]  HPI: Heather Strickland is a 42 y.o. female presenting on 01/31/2017 for Vaginal Bleeding (began 3 weeks ago lasting 2 weeks. pt states since she has had the Mirena she has always had spotting. pt states last bleeding episode she noticed a "bad smell'" pt states she would like STD check.)   HPI   Chief Complaint  Patient presents with  . Vaginal Bleeding    began 3 weeks ago lasting 2 weeks. pt states since she has had the Mirena she has always had spotting. pt states last bleeding episode she noticed a "bad smell'" pt states she would like STD check.    Pt states is not currently bleeding but she did have bleeding for 2 weeks.   Pt was seen by Dr Glo Herring 09/02/16 for same thing (abn bleeding).  His office note states bleeding due to ovarian cyst. And [place] was ordered but it appears that the [place] was never done.   Pt still has IUD in place.   Pt uncertain if her [employer] discount is still active.   Pt worried about having an STD.  She thinks her husband may have had other partners.    Relevant past medical, surgical, family and social history reviewed and updated as indicated. Interim medical history since our last visit reviewed. Allergies and medications reviewed and updated.   Current Outpatient Medications:  .  ferrous sulfate 325 (65 FE) MG tablet, Take 1 tablet (325 mg total) by mouth daily., Disp: 30 tablet, Rfl: 0 .  levonorgestrel (MIRENA) 20 MCG/24HR IUD, 1 each by Intrauterine route once., Disp: , Rfl:  .  Multiple Vitamins-Minerals (MULTIVITAMIN WITH MINERALS) tablet, Take 1 tablet by mouth daily., Disp: , Rfl:  .  pantoprazole (PROTONIX) 40 MG tablet, Take 1 tablet (40  mg total) by mouth 2 (two) times daily before a meal. Tome una tableta por boca diaria (Patient taking differently: Take 40 mg by mouth daily. Tome una tableta por boca diaria), Disp: 60 tablet, Rfl: 3 .  sodium fluoride (FLUORISHIELD) 1.1 % GEL dental gel, Instill one drop of gel per tooth space of fluoride tray. Place over teeth for 5 minutes. Remove. Spit out excess. Repeat nightly., Disp: 120 mL, Rfl: PRN   Review of Systems  Constitutional: Negative for appetite change, chills, diaphoresis, fatigue, fever and unexpected weight change.  HENT: Negative for congestion, dental problem, drooling, ear pain, facial swelling, hearing loss, mouth sores, sneezing, sore throat, trouble swallowing and voice change.   Eyes: Positive for visual disturbance. Negative for pain, discharge, redness and itching.  Respiratory: Negative for cough, choking, shortness of breath and wheezing.   Cardiovascular: Negative for chest pain, palpitations and leg swelling.  Gastrointestinal: Negative for abdominal pain, blood in stool, constipation, diarrhea and vomiting.  Endocrine: Negative for cold intolerance, heat intolerance and polydipsia.  Genitourinary: Negative for decreased urine volume, dysuria and hematuria.  Musculoskeletal: Negative for arthralgias, back pain and gait problem.  Skin: Negative for rash.  Allergic/Immunologic: Negative for environmental allergies.  Neurological: Negative for seizures, syncope, light-headedness and headaches.  Hematological: Negative for adenopathy.  Psychiatric/Behavioral:  Negative for agitation, dysphoric mood and suicidal ideas. The patient is not nervous/anxious.     Per HPI unless specifically indicated above     Objective:    BP 116/70 (BP Location: Left Arm, Patient Position: Sitting, Cuff Size: Normal)   Pulse 77   Temp 98.2 F (36.8 C)   Ht 5\' 2"  (1.575 m)   Wt 139 lb 4 oz (63.2 kg)   SpO2 98%   BMI 25.47 kg/m   Wt Readings from Last 3 Encounters:   01/31/17 139 lb 4 oz (63.2 kg)  12/28/16 137 lb 9.6 oz (62.4 kg)  10/01/16 141 lb (64 kg)    Physical Exam  Constitutional: She is oriented to person, place, and time. She appears well-developed and well-nourished.  HENT:  Head: Normocephalic and atraumatic.  Neck: Neck supple.  Cardiovascular: Normal rate and regular rhythm.  Pulmonary/Chest: Effort normal and breath sounds normal.  Abdominal: Soft. Bowel sounds are normal. She exhibits no mass. There is no hepatosplenomegaly. There is no tenderness.  Musculoskeletal: She exhibits no edema.  Lymphadenopathy:    She has no cervical adenopathy.  Neurological: She is alert and oriented to person, place, and time.  Skin: Skin is warm and dry.  Psychiatric: She has a normal mood and affect. Her behavior is normal.  Vitals reviewed.       Assessment & Plan:    Encounter Diagnosis  Name Primary?  . Abnormal uterine bleeding Yes     -will reorder the [place] that was originally ordered in August by Dr Glo Herring -refer pt to [facility] for STD testing as [facility] cannot provide that service -pt to check on dates of her [employer] discount.  She is encouraged to update it if it has expired.

## 2017-01-31 NOTE — Patient Instructions (Signed)
Financial Counselor- [phone]  

## 2017-02-04 ENCOUNTER — Ambulatory Visit (HOSPITAL_COMMUNITY)
Admission: RE | Admit: 2017-02-04 | Discharge: 2017-02-04 | Disposition: A | Discharge status: Home / Self Care | Payer: Self-pay | Source: Ambulatory Visit | Attending: Physician Assistant | Admitting: Physician Assistant

## 2017-02-04 DIAGNOSIS — N939 Abnormal uterine and vaginal bleeding, unspecified: Secondary | ICD-10-CM | POA: Insufficient documentation

## 2017-02-04 DIAGNOSIS — R9389 Abnormal findings on diagnostic imaging of other specified body structures: Secondary | ICD-10-CM | POA: Insufficient documentation

## 2017-02-04 DIAGNOSIS — Z975 Presence of (intrauterine) contraceptive device: Secondary | ICD-10-CM | POA: Insufficient documentation

## 2017-02-16 ENCOUNTER — Telehealth: Payer: Self-pay | Admitting: *Deleted

## 2017-02-16 NOTE — Telephone Encounter (Signed)
CALLED PATIENT'S DAUGHTER [facility] KATIE LORENZO TO RESCHEDULE FU APPT. ON 02-23-17 DUE TO DR. SQUIRE BEING OUT OF THE OFFICE, RESCHEDULED FOR 03-04-17 @ 10:40 AM, PATIENT'S DAUGHTER [facility] AGREED TO THIS NEW DATE AND TIME

## 2017-02-23 ENCOUNTER — Ambulatory Visit: Payer: Self-pay | Admitting: Radiation Oncology

## 2017-02-28 ENCOUNTER — Ambulatory Visit: Payer: Self-pay | Admitting: Physician Assistant

## 2017-03-04 ENCOUNTER — Ambulatory Visit
Admission: RE | Admit: 2017-03-04 | Discharge: 2017-03-04 | Disposition: A | Discharge status: Home / Self Care | Payer: Self-pay | Source: Ambulatory Visit | Attending: Radiation Oncology | Admitting: Radiation Oncology

## 2017-03-04 ENCOUNTER — Encounter: Payer: Self-pay | Admitting: Radiation Oncology

## 2017-03-04 ENCOUNTER — Other Ambulatory Visit: Payer: Self-pay | Admitting: Radiation Oncology

## 2017-03-04 VITALS — BP 115/81 | HR 70 | Temp 98.5°F | Ht 62.0 in | Wt 136.2 lb

## 2017-03-04 DIAGNOSIS — R682 Dry mouth, unspecified: Secondary | ICD-10-CM | POA: Insufficient documentation

## 2017-03-04 DIAGNOSIS — N83209 Unspecified ovarian cyst, unspecified side: Secondary | ICD-10-CM | POA: Insufficient documentation

## 2017-03-04 DIAGNOSIS — H539 Unspecified visual disturbance: Secondary | ICD-10-CM | POA: Insufficient documentation

## 2017-03-04 DIAGNOSIS — Z1329 Encounter for screening for other suspected endocrine disorder: Secondary | ICD-10-CM

## 2017-03-04 DIAGNOSIS — Z85819 Personal history of malignant neoplasm of unspecified site of lip, oral cavity, and pharynx: Secondary | ICD-10-CM | POA: Insufficient documentation

## 2017-03-04 DIAGNOSIS — Z79899 Other long term (current) drug therapy: Secondary | ICD-10-CM | POA: Insufficient documentation

## 2017-03-04 DIAGNOSIS — Z923 Personal history of irradiation: Secondary | ICD-10-CM | POA: Insufficient documentation

## 2017-03-04 DIAGNOSIS — C090 Malignant neoplasm of tonsillar fossa: Secondary | ICD-10-CM

## 2017-03-04 LAB — TSH: TSH: 2.004 u[IU]/mL (ref 0.308–3.960)

## 2017-03-04 NOTE — Progress Notes (Signed)
Radiation Oncology         [phone] ________________________________  Name: Heather Strickland MRN: [id number]  Date: 03/04/2017  DOB: 05/28/1975  Follow-Up Visit Note  CC: Soyla Dryer, PA-C  [place], Kelby Fam, MD  Diagnosis and Prior Radiotherapy:       ICD-10-CM   1. Cancer of tonsillar fossa (HCC) C09.0     Squamous cell carcinoma of the left tonsil, Clinical Stage I (cT2, cN1, cM0 p16 positive) Radiation treatment dates:   03/08/2016 - 04/23/2016 Site/dose:     Left Tonsil and Bilateral neck / 70 Gy in 35 fractions to gross disease, 63 Gy in 35 fractions to high risk nodal echelons, and 56 Gy in 35 fractions to intermediate risk nodal echelons  CHIEF COMPLAINT:  Here for follow-up and surveillance of left tonsil and bilateral neck cancer  Narrative:  The patient returns today for routine follow-up of radiation completed 10 months ago to her left tonsil and bilateral neck. We are joined by a [place] medical interpreter. The patient continues to follow with Soyla Dryer, PA-C for vaginal bleeding. According to her notes, they are aware of an ovarian cyst. She underwent a transvaginal ultrasound on 02/04/2017. This was a limited study. It did show heterogeneous appearance of the endometrium, warranting possible MRI of the pelvis. The ovaries could not be visualized due to bowel gas.  Pain issues, if any: She reports positional pain to her bilateral neck in the mornings.  Using a feeding tube?: No, removed in September 2018  Weight changes, if any:  Wt Readings from Last 3 Encounters:  03/04/17 136 lb 3.2 oz (61.8 kg)  01/31/17 139 lb 4 oz (63.2 kg)  12/28/16 137 lb 9.6 oz (62.4 kg)   Swallowing issues, if any: She reports eating foods with liquids to help with swallowing food. She reports a dry mouth especially in the morning and evening. She reports continued taste changes with certain foods but this is somewhat better.  Smoking or chewing tobacco? No  Using  fluoride trays daily? Occasionally. She will contact the pharmacy with the translator's help today.   Last ENT visit was on: 11/15/2016 with Dr. Benjamine Mola  Other notable issues, if any: She saw Dr. Talbert Cage on 12/28/2016 at [facility].   She has vision issues, new over the past year, addressed by ophthalmology and MRI brain (neg).   ALLERGIES:  is allergic to no known allergies.  Meds: Current Outpatient Medications  Medication Sig Dispense Refill  . ferrous sulfate 325 (65 FE) MG tablet Take 1 tablet (325 mg total) by mouth daily. 30 tablet 0  . levonorgestrel (MIRENA) 20 MCG/24HR IUD 1 each by Intrauterine route once.    . Multiple Vitamins-Minerals (MULTIVITAMIN WITH MINERALS) tablet Take 1 tablet by mouth daily.    . sodium fluoride (FLUORISHIELD) 1.1 % GEL dental gel Instill one drop of gel per tooth space of fluoride tray. Place over teeth for 5 minutes. Remove. Spit out excess. Repeat nightly. 120 mL PRN  . pantoprazole (PROTONIX) 40 MG tablet Take 1 tablet (40 mg total) by mouth 2 (two) times daily before a meal. Tome una tableta por boca diaria (Patient not taking: Reported on 03/04/2017) 60 tablet 3   No current facility-administered medications for this encounter.      Physical Findings: Wt Readings from Last 3 Encounters:  03/04/17 136 lb 3.2 oz (61.8 kg)  01/31/17 139 lb 4 oz (63.2 kg)  12/28/16 137 lb 9.6 oz (62.4 kg)    height  is 5\' 2"  (1.575 m) and weight is 136 lb 3.2 oz (61.8 kg). Her temperature is 98.5 F (36.9 C). Her blood pressure is 115/81 and her pulse is 70. Her oxygen saturation is 100%.   General: Alert and oriented, in no acute distress. HEENT: Head is normocephalic. Oral cavity and oropharynx are clear. Neck: Neck is supple, no palpable cervical or supraclavicular lymphadenopathy. Heart: Regular in rate and rhythm with no murmurs, rubs, or gallops. Chest: Clear to auscultation bilaterally, with no rhonchi, wheezes, or rales. Abdomen: Soft,  nontender, nondistended, with no rigidity or guarding. Extremities: No cyanosis or edema. Lymphatics: see Neck Exam Skin: No concerning lesions. Skin has healed well over her neck.   Lab Findings: Lab Results  Component Value Date   WBC 4.2 01/12/2017   HGB 12.9 01/12/2017   HCT 37.9 01/12/2017   MCV 94.8 01/12/2017   PLT 213 01/12/2017    Lab Results  Component Value Date   TSH 2.004 03/04/2017    Radiographic Findings: US Transvaginal Non-ob  Result Date: 02/04/2017 CLINICAL DATA:  Irregular uterine bleeding. IUD placed in April 2016. EXAM: TRANSVAGINAL ULTRASOUND OF PELVIS TECHNIQUE: Both transabdominal and transvaginal ultrasound examinations of the pelvis were performed. Transabdominal technique was performed for global imaging of the pelvis including uterus, ovaries, adnexal regions, and pelvic cul-de-sac. It was necessary to proceed with endovaginal exam following the transabdominal exam to visualize the endometrium. COMPARISON:  None available. FINDINGS: Uterus Measurements: 10.3 x 5.4 x 6.7 cm. No fibroids or other mass visualized. Endometrium Thickness: Heterogeneous appearance of the endometrium which measures 7.6 mm. IUD seen within the endometrial canal. Right ovary Not seen. Left ovary not seen. Other findings No abnormal free fluid. IMPRESSION: Bilateral ovaries not visualized, obscured by bowel gas. Heterogeneous appearance of the endometrium, with poorly defined transitional zone. OBGYN consultation and possibly MRI of the pelvis may be considered for further evaluation. IUD within the endometrial canal. Electronically Signed   By: Fidela Salisbury M.D.   On: 02/04/2017 18:31    Impression/Plan:    1) Head and Neck Cancer Status: NED  2) Nutritional Status: Stable weight. She has continued taste changes but improving. PEG tube: Removed  3) Risk Factors: The patient has been educated about risk factors including alcohol and tobacco abuse; they understand that  avoidance of excessive alcohol and any tobacco is important to prevent recurrences as well as other cancers  4) Swallowing: Patient reports continued dryness in mouth and throat. Encouraged to drink plenty of water and to try eating foods with sauces and gravies. We also discussed the option of Biotene.  5) Dental: Encouraged to continue regular followup with dentistry and dental hygiene including fluoride rinses.   6) Thyroid function: Ordered TSH lab today. Results WNL Lab Results  Component Value Date   TSH 2.004 03/04/2017    7) GYN: Patient was found on PET in August to have an adnexal cyst on the left. Referral was made to gynecology but it's not clear if this cyst was ever fully worked up. Now she also has vaginal bleeding and non-specific findings in the endometrium, warranting possible MRI of the pelvis. Soyla Dryer, PA-C continues to address these issues. Last PAP smear was in August 2018 and normal. I let Soyla Dryer know that although this is unrelated to her head/neck cancer and that it seems reasonable that she order an MRI of the pelvis given questionable findings of ovary and endometrium on prior PET and ultrasound  respectively.   8) Vision  issues: Continue follow up with ophthalmology.  9) Follow-up with me in 6 months. The patient was encouraged to call with any issues or questions before then.   ______________________________________________________  Eppie Gibson, MD  This document serves as a record of services personally performed by Eppie Gibson, MD. It was created on her behalf by Rae Lips, a trained medical scribe. The creation of this record is based on the scribe's personal observations and the provider's statements to them. This document has been checked and approved by the attending provider.

## 2017-03-07 ENCOUNTER — Encounter: Payer: Self-pay | Admitting: Physician Assistant

## 2017-03-10 ENCOUNTER — Ambulatory Visit: Payer: Self-pay | Admitting: Physician Assistant

## 2017-03-10 ENCOUNTER — Encounter: Payer: Self-pay | Admitting: Physician Assistant

## 2017-03-10 VITALS — BP 90/60 | HR 83 | Temp 99.1°F | Ht 62.0 in | Wt 136.0 lb

## 2017-03-10 DIAGNOSIS — N9489 Other specified conditions associated with female genital organs and menstrual cycle: Secondary | ICD-10-CM

## 2017-03-10 DIAGNOSIS — R948 Abnormal results of function studies of other organs and systems: Secondary | ICD-10-CM

## 2017-03-10 DIAGNOSIS — N939 Abnormal uterine and vaginal bleeding, unspecified: Secondary | ICD-10-CM

## 2017-03-10 DIAGNOSIS — C090 Malignant neoplasm of tonsillar fossa: Secondary | ICD-10-CM

## 2017-03-10 NOTE — Progress Notes (Signed)
BP 90/60 (BP Location: Left Arm, Patient Position: Sitting, Cuff Size: Normal)   Pulse 83   Temp 99.1 F (37.3 C)   Ht 5\' 2"  (1.575 m)   Wt 136 lb (61.7 kg)   SpO2 94%   BMI 24.87 kg/m    Subjective:    Patient ID: Heather Strickland, female    DOB: 02/03/1975, 42 y.o.   MRN: [id number]  HPI: Heather Strickland is a 42 y.o. female presenting on 03/10/2017 for Follow-up   HPI   Pt is s/p treatment for tonsillar cancer.  She sees oncologist regularly.  Pt had Left adnexal mass on PET scan in August.  She had [place] last month that was non-diagnostic.    Relevant past medical, surgical, family and social history reviewed and updated as indicated. Interim medical history since our last visit reviewed. Allergies and medications reviewed and updated.   Current Outpatient Medications:  .  ferrous sulfate 325 (65 FE) MG tablet, Take 1 tablet (325 mg total) by mouth daily., Disp: 30 tablet, Rfl: 0 .  levonorgestrel (MIRENA) 20 MCG/24HR IUD, 1 each by Intrauterine route once., Disp: , Rfl:  .  Multiple Vitamins-Minerals (MULTIVITAMIN WITH MINERALS) tablet, Take 1 tablet by mouth daily., Disp: , Rfl:  .  pantoprazole (PROTONIX) 40 MG tablet, Take 1 tablet (40 mg total) by mouth 2 (two) times daily before a meal. Tome una tableta por boca diaria (Patient taking differently: Take 40 mg by mouth daily. Tome una tableta por boca diaria), Disp: 60 tablet, Rfl: 3 .  sodium fluoride (FLUORISHIELD) 1.1 % GEL dental gel, Instill one drop of gel per tooth space of fluoride tray. Place over teeth for 5 minutes. Remove. Spit out excess. Repeat nightly., Disp: 120 mL, Rfl: PRN   Review of Systems  Constitutional: Negative for appetite change, chills, diaphoresis, fatigue, fever and unexpected weight change.  HENT: Negative for congestion, dental problem, drooling, ear pain, facial swelling, hearing loss, mouth sores, sneezing, sore throat, trouble swallowing and voice change.   Eyes: Negative for  pain, discharge, redness, itching and visual disturbance.  Respiratory: Negative for cough, choking, shortness of breath and wheezing.   Cardiovascular: Negative for chest pain, palpitations and leg swelling.  Gastrointestinal: Negative for abdominal pain, blood in stool, constipation, diarrhea and vomiting.  Endocrine: Negative for cold intolerance, heat intolerance and polydipsia.  Genitourinary: Negative for decreased urine volume, dysuria and hematuria.  Musculoskeletal: Negative for arthralgias, back pain and gait problem.  Skin: Negative for rash.  Allergic/Immunologic: Negative for environmental allergies.  Neurological: Negative for seizures, syncope, light-headedness and headaches.  Hematological: Negative for adenopathy.  Psychiatric/Behavioral: Negative for agitation, dysphoric mood and suicidal ideas. The patient is not nervous/anxious.     Per HPI unless specifically indicated above     Objective:    BP 90/60 (BP Location: Left Arm, Patient Position: Sitting, Cuff Size: Normal)   Pulse 83   Temp 99.1 F (37.3 C)   Ht 5\' 2"  (1.575 m)   Wt 136 lb (61.7 kg)   SpO2 94%   BMI 24.87 kg/m   Wt Readings from Last 3 Encounters:  03/10/17 136 lb (61.7 kg)  03/04/17 136 lb 3.2 oz (61.8 kg)  01/31/17 139 lb 4 oz (63.2 kg)    Physical Exam  Constitutional: She is oriented to person, place, and time. She appears well-developed and well-nourished.  HENT:  Head: Normocephalic and atraumatic.  Neck: Neck supple.  Cardiovascular: Normal rate and regular rhythm.  Pulmonary/Chest: Effort normal and breath  sounds normal.  Abdominal: Soft. Bowel sounds are normal. She exhibits no mass. There is no hepatosplenomegaly. There is no tenderness.  Musculoskeletal: She exhibits no edema.  Lymphadenopathy:    She has no cervical adenopathy.  Neurological: She is alert and oriented to person, place, and time.  Skin: Skin is warm and dry.  Psychiatric: She has a normal mood and affect. Her  behavior is normal.  Vitals reviewed.       Assessment & Plan:    Encounter Diagnoses  Name Primary?  . Adnexal mass Yes  . Cancer of tonsillar fossa ([place])   . Abnormal positron emission tomography (PET) scan   . Abnormal uterine bleeding     -will order MRI pelvis  -Refer to gyn (pt was seen in August but didn't return for follow up) -Pt has turned in [employer] application -pt to follow up here in 6 weeks.  RTO sooner prn

## 2017-03-17 ENCOUNTER — Ambulatory Visit (HOSPITAL_COMMUNITY)
Admission: RE | Admit: 2017-03-17 | Discharge: 2017-03-17 | Disposition: A | Discharge status: Home / Self Care | Payer: Self-pay | Source: Ambulatory Visit | Attending: Physician Assistant | Admitting: Physician Assistant

## 2017-03-17 DIAGNOSIS — R948 Abnormal results of function studies of other organs and systems: Secondary | ICD-10-CM | POA: Insufficient documentation

## 2017-03-17 DIAGNOSIS — N939 Abnormal uterine and vaginal bleeding, unspecified: Secondary | ICD-10-CM | POA: Insufficient documentation

## 2017-03-17 DIAGNOSIS — N949 Unspecified condition associated with female genital organs and menstrual cycle: Secondary | ICD-10-CM | POA: Insufficient documentation

## 2017-03-17 DIAGNOSIS — N852 Hypertrophy of uterus: Secondary | ICD-10-CM | POA: Insufficient documentation

## 2017-03-17 DIAGNOSIS — C090 Malignant neoplasm of tonsillar fossa: Secondary | ICD-10-CM | POA: Insufficient documentation

## 2017-03-17 MED ORDER — GADOBENATE DIMEGLUMINE 529 MG/ML IV SOLN
12.0000 mL | Freq: Once | INTRAVENOUS | Status: AC | PRN
  Administered 2017-03-17: 12 mL via INTRAVENOUS

## 2017-03-21 ENCOUNTER — Encounter: Payer: Self-pay | Admitting: Obstetrics and Gynecology

## 2017-03-21 ENCOUNTER — Ambulatory Visit (INDEPENDENT_AMBULATORY_CARE_PROVIDER_SITE_OTHER): Payer: Self-pay | Admitting: Obstetrics and Gynecology

## 2017-03-21 VITALS — BP 110/80 | HR 97 | Wt 139.0 lb

## 2017-03-21 DIAGNOSIS — N83202 Unspecified ovarian cyst, left side: Secondary | ICD-10-CM

## 2017-03-21 DIAGNOSIS — N83209 Unspecified ovarian cyst, unspecified side: Secondary | ICD-10-CM

## 2017-03-21 NOTE — Progress Notes (Signed)
Patient ID: Heather Strickland, female   DOB: [birth year], 42 y.o.   MRN: [id number]   [facility] Visit  @DATE @            Patient name: Heather Strickland MRN [id number]  Date of birth: [birth year]  CC & HPI:  Heather Strickland is a 42 y.o. female presenting today to discuss the results of her recent U/S and MRI. She was last seen 09/02/2016 for a pap smear that resulted normal. The patient has been experiencing occasional vaginal bleeding, even with an IUD in place. Associated symptoms include a yellowish "fluid" after being with a past partner. The patient denies fever, chills or any other symptoms or complaints at this time.   ACCOMPANIED BY A SPANISH INTERPRETER.  ROS:  ROS +occasional vaginal bleeding +yellowish "fluid" -fever -chills All systems are negative except as noted in the HPI and PMH.   Pertinent History Reviewed:   Reviewed: Significant for HPV, ovarian cyst, squamous cell carcinoma  Medical         Past Medical History:  Diagnosis Date  . Anemia   . GERD (gastroesophageal reflux disease)   . History of radiation therapy 03/08/2016 - 04/23/2016   Left Tonsil and Bilateral Neck  . HPV in female   . 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 CESAREAN SECTION  [id number]  . ESOPHAGOGASTRODUODENOSCOPY N/A 11/07/2015   Dr. Gala Romney: normal esophagus, chronic gastritis   . IR GASTROSTOMY TUBE REMOVAL  10/08/2016  . IR GENERIC HISTORICAL  02/11/2016   IR FLUORO GUIDE PORT INSERTION RIGHT 02/11/2016 Sandi Mariscal, MD [facility]  . IR GENERIC HISTORICAL  02/11/2016   IR US GUIDE VASC ACCESS RIGHT 02/11/2016 Sandi Mariscal, MD [facility]  . IR GENERIC HISTORICAL  02/11/2016   IR GASTROSTOMY TUBE MOD SED 02/11/2016 Sandi Mariscal, MD [facility]  . IR REMOVAL TUN ACCESS W/ PORT W/O FL MOD SED  01/12/2017  . MULTIPLE EXTRACTIONS WITH ALVEOLOPLASTY N/A  02/20/2016   Procedure: Extraction of tooth #'s 15,17,18 and 32 with alveoloplasty and dental cleaning of teeth;  Surgeon: Lenn Cal, DDS;  Location: [facility];  Service: Oral Surgery;  Laterality: N/A;   Medications: Reviewed & Updated - see associated section                       Current Outpatient Medications:  .  ferrous sulfate 325 (65 FE) MG tablet, Take 1 tablet (325 mg total) by mouth daily., Disp: 30 tablet, Rfl: 0 .  levonorgestrel (MIRENA) 20 MCG/24HR IUD, 1 each by Intrauterine route once., Disp: , Rfl:  .  Multiple Vitamins-Minerals (MULTIVITAMIN WITH MINERALS) tablet, Take 1 tablet by mouth daily., Disp: , Rfl:  .  sodium fluoride (FLUORISHIELD) 1.1 % GEL dental gel, Instill one drop of gel per tooth space of fluoride tray. Place over teeth for 5 minutes. Remove. Spit out excess. Repeat nightly., Disp: 120 mL, Rfl: PRN .  pantoprazole (PROTONIX) 40 MG tablet, Take 1 tablet (40 mg total) by mouth 2 (two) times daily before a meal. Tome una tableta por boca diaria (Patient not taking: Reported on 03/21/2017), Disp: 60 tablet, Rfl: 3   Social History: Reviewed -  reports that  has never smoked. she  has never used smokeless tobacco.  Objective Findings:  Vitals: Blood pressure 110/80, pulse 97, weight 139 lb (63 kg).  PHYSICAL EXAMINATION General appearance - alert, well appearing, and in no distress, oriented to person, place, and time and normal appearing weight Mental status - alert, oriented to person, place, and time, normal mood, behavior, speech, dress, motor activity, and thought processes CLINICAL DATA:  Cystic left adnexal mass on recent CT. Possible uterine adenomyosis on recent ultrasound.  EXAM: MRI PELVIS WITHOUT AND WITH CONTRAST  TECHNIQUE: Multiplanar multisequence MR imaging of the pelvis was performed both before and after administration of intravenous contrast.  CONTRAST:  78mL MULTIHANCE GADOBENATE DIMEGLUMINE 529 MG/ML IV SOLN  COMPARISON:   Pelvic ultrasound on 02/04/2017 and PET-CT 08/17/2016  FINDINGS: Urinary Tract: No urinary bladder or urethral abnormality.  Bowel: Unremarkable pelvic bowel loops.  Vascular/Lymphatic: Unremarkable. No pathologically enlarged pelvic lymph nodes identified.  Reproductive:  -- Uterus: Measures 10.9 x 6.1 by 7.3 cm (volume = 250 cm^3). No fibroids identified. Diffuse thickening of the myometrial junctional zone is seen, consistent with adenomyosis. Prior C-section scar noted. IUD seen in appropriate position in the endometrial cavity. Endometrial thickness measures 8 mm. Normal appearance cervix and vagina.  -- Right ovary: Appears normal. No mass or inflammatory process identified.  -- Left ovary: Appears normal. Small corpus luteum noted. No adnexal mass identified. Cystic lesion seen on previous PET-CT has resolved.  Other: No peritoneal masses or abnormal free fluid.  Musculoskeletal:  No suspicious bone lesions identified.  IMPRESSION: Enlarged uterus with diffuse adenomyosis.  No fibroids identified.  IUD in appropriate position.  Normal appearance of both ovaries. Interval resolution of left adnexal cystic lesion since previous study.   Electronically Signed   By: Earle Gell M.D.   CLINICAL DATA:  Irregular uterine bleeding. IUD placed in April 2016.  EXAM: TRANSVAGINAL ULTRASOUND OF PELVIS  TECHNIQUE: Both transabdominal and transvaginal ultrasound examinations of the pelvis were performed. Transabdominal technique was performed for global imaging of the pelvis including uterus, ovaries, adnexal regions, and pelvic cul-de-sac. It was necessary to proceed with endovaginal exam following the transabdominal exam to visualize the endometrium.  COMPARISON:  None available.  FINDINGS: Uterus  Measurements: 10.3 x 5.4 x 6.7 cm. No fibroids or other mass visualized.  Endometrium  Thickness: Heterogeneous appearance of the  endometrium which measures 7.6 mm. IUD seen within the endometrial canal.  Right ovary  Not seen.  Left ovary  not seen.  Other findings  No abnormal free fluid.  IMPRESSION: Bilateral ovaries not visualized, obscured by bowel gas.  Heterogeneous appearance of the endometrium, with poorly defined transitional zone. OBGYN consultation and possibly MRI of the pelvis may be considered for further evaluation.  IUD within the endometrial canal.   Electronically Signed   By: Fidela Salisbury M.D.   On: 02/04/2017 18:31   PELVIC DEFERRED  Discussion: 1. Discussed with pt the results of her recent U/S and MRI.  At end of discussion, pt had opportunity to ask questions and has no further questions at this time.   Specific discussion of U/S and MRI results as noted above. Greater than 50% was spent in counseling and coordination of care with the patient.   Total time greater than: 25 minutes.  Level 4 due to use of interpreter.  Assessment & Plan:   A:  1. Resolved left ovarian cyst  P:  1. F/U PRN  By signing my name below, I, Margit Banda, attest that this documentation has been  prepared under the direction and in the presence of Jonnie Kind, MD. Electronically Signed: Margit Banda, Medical Scribe. 03/21/17. 3:33 PM.  I personally performed the services described in this documentation, which was SCRIBED in my presence. The recorded information has been reviewed and considered accurate. It has been edited as necessary during review. Jonnie Kind, MD

## 2017-03-25 ENCOUNTER — Ambulatory Visit: Payer: Self-pay | Admitting: Diagnostic Neuroimaging

## 2017-03-28 ENCOUNTER — Other Ambulatory Visit (HOSPITAL_COMMUNITY): Payer: Self-pay

## 2017-03-28 ENCOUNTER — Ambulatory Visit (HOSPITAL_COMMUNITY): Admission: RE | Admit: 2017-03-28 | Payer: Self-pay | Source: Ambulatory Visit

## 2017-03-29 ENCOUNTER — Ambulatory Visit: Payer: Self-pay | Admitting: Physician Assistant

## 2017-03-31 ENCOUNTER — Ambulatory Visit (HOSPITAL_COMMUNITY): Payer: Self-pay | Admitting: Internal Medicine

## 2017-04-11 ENCOUNTER — Ambulatory Visit (HOSPITAL_COMMUNITY): Payer: Self-pay | Admitting: Internal Medicine

## 2017-04-12 ENCOUNTER — Other Ambulatory Visit (HOSPITAL_COMMUNITY): Payer: Self-pay | Admitting: Adult Health

## 2017-04-15 ENCOUNTER — Ambulatory Visit (HOSPITAL_COMMUNITY): Payer: Self-pay | Admitting: Internal Medicine

## 2017-04-19 ENCOUNTER — Inpatient Hospital Stay (HOSPITAL_COMMUNITY): Payer: Self-pay | Attending: Internal Medicine | Admitting: Internal Medicine

## 2017-04-19 ENCOUNTER — Other Ambulatory Visit: Payer: Self-pay

## 2017-04-19 ENCOUNTER — Encounter (HOSPITAL_COMMUNITY): Payer: Self-pay | Admitting: Internal Medicine

## 2017-04-19 VITALS — BP 136/83 | HR 71 | Temp 98.6°F | Resp 16 | Wt 138.4 lb

## 2017-04-19 DIAGNOSIS — Z79899 Other long term (current) drug therapy: Secondary | ICD-10-CM | POA: Insufficient documentation

## 2017-04-19 DIAGNOSIS — K219 Gastro-esophageal reflux disease without esophagitis: Secondary | ICD-10-CM | POA: Insufficient documentation

## 2017-04-19 DIAGNOSIS — J320 Chronic maxillary sinusitis: Secondary | ICD-10-CM | POA: Insufficient documentation

## 2017-04-19 DIAGNOSIS — C090 Malignant neoplasm of tonsillar fossa: Secondary | ICD-10-CM | POA: Insufficient documentation

## 2017-04-19 DIAGNOSIS — Z923 Personal history of irradiation: Secondary | ICD-10-CM | POA: Insufficient documentation

## 2017-04-20 NOTE — Progress Notes (Signed)
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  Pt was diagnosed in 01/2016. Treated with radiation therapy alone by Dr. Isidore Moos; completed treatment on 04/23/16. PET scan 08/17/16 showed  IMPRESSION: 1. Interval resolution of the left pharyngeal mass. No significant residual hypermetabolic activity or CT abnormality is observed in this location. No regional adenopathy or evidence of distant metastatic spread. 2. Mild chronic right maxillary sinusitis. 3. 5.5 by 4.2 cm left adnexal cyst is new and photopenic. Based on current guidelines, pelvic sonography is recommended in 6-12 weeks for follow up.   Pt had Pelvic USN done on 02/04/2017 that showed  IMPRESSION: Bilateral ovaries not visualized, obscured by bowel gas.  Heterogeneous appearance of the endometrium, with poorly defined transitional zone. OBGYN consultation and possibly MRI of the pelvis may be considered for further evaluation.  IUD within the endometrial canal.  She will be set up for PET scan 08/2017  for interval evaluation based on head and neck cancer and pelvic findings noted on recent imaging.  She will RTC to go over results.  She should follow-up with Dr. Benjamine Mola and Dr. Isidore Moos as recommended.    2.  Vaginal symptoms.  Pt reports she has not been seen by GYN.  Pelvic USN done 02/04/2017 showed   IMPRESSION: Bilateral ovaries not visualized, obscured by bowel gas.  Heterogeneous  appearance of the endometrium, with poorly defined transitional zone. OBGYN consultation and possibly MRI of the pelvis may be considered for further evaluation.  She has IUD.  She is complaining of vaginal discharge.  She is referred to GYN for evaluation due to changes noted on recent pelvic USN as well as vaginal symptoms.    Current Status: Vision is seen today for follow-up.  She is complaining of vaginal symptoms such as increasing discharge.     Cancer of tonsillar fossa ([facility])   09/1926 Procedure    Gastrostomy tube removed.      01/15/2016 Procedure    Left tonsil biopsy by Dr. Benjamine Mola      01/15/2016 Initial Diagnosis    She presented to Dr. Benjamine Mola after being referred by [name], NP (GI) with a 5-6 month history of severe sore throat.  The patient never sought treatment for this issue previously.  Over the last 3 months, patient reported intermittent bleeding from left tonsil.  She also feels a hard mass along the left side of neck.  On physical exam by Dr. Benjamine Mola, he noted 3+ ulceration and erythema on the left tonsil.  On flexible laryngoscopy, tonsillar hypertrophy with ulceration was noted on the left.      01/19/2016 Pathology Results    Invasive squamous cell carcinoma, poorly differentiated.      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 Fine soft tissue details lost at these levels.  Pharyngeal mass centered in  the left palatini tonsil with surround mucosal thickening as described below probably representing neoplasm.  Mucosal thickening of the left aspect of hypopharynx and pharynx with inferior most extension to the margin of the supraglottic airway without appreciable invasion into epiglottis, aryepiglottic folds, vocal cords, or paraglottic fat.  Superior most extension of mucosal thickening to the left aspect of the  uvula and along the left aspect of the soft palate to the hard/soft palate junction, this region is obscured by motion artifact.  Anteriorly there is effacement of left glossopharyngeal sulcus without appreciable tongue base invasion, this region is obscured by motion artifact.  Asymmetric enlargement of left-sided level 2 and 3 cervical lymph nodes possibly metastatic. No evidence for lymph node necrosis or extra nodal extension.      01/29/2016 Imaging    CT chest-  No evidence of metastatic disease in the chest.      02/06/2016 PET scan    1. Highly hypermetabolic left palatine tonsillar mass, maximum SUV 38.5. 2. A left station IIa lymph node measure 1.1 cm in short axis, mildly enlarged on image 38/3, but has only a borderline elevated SUV at 3.5. 3. Other imaging findings of potential clinical significance: Mild cardiomegaly. Calcified granuloma in the superior segment right lower lobe (benign). IUD satisfactorily positioned in the uterus.      02/11/2016 Procedure    Successful fluoroscopic insertion of a 20-French pull-through gastrostomy tube.      02/11/2016 Procedure    Successful placement of a right internal jugular approach power injectable Port-A-Cath. The catheter is ready for immediate use.      03/08/2016 - 04/23/2016 Radiation Therapy    Radiation alone Isidore Moos). Left Tonsil and Bilateral neck / 70 Gy in 35 fractions to gross disease, 63 Gy in 35 fractions to high risk nodal echelons, and 56 Gy in 35 fractions to intermediate risk nodal echelons      10/08/2016 Procedure    Pull-through gastrostomy tube removed.      01/12/2017 Procedure    Port removed by IR        Problem List Patient Active Problem List   Diagnosis Date Noted  . Port catheter in place [facility] 04/22/2016  . 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 Oral candidiasis [B37.0] 03/30/2016  . Weight loss [R63.4] 03/30/2016  . Hemoptysis [R04.2] 03/11/2016  .  Cancer of tonsillar fossa (Heather Strickland) [C09.0] 01/27/2016  . GERD (gastroesophageal reflux disease) [K21.9] 12/22/2015  . H. pylori infection [A04.8] 07/21/2015  . Esophageal reflux [K21.9] 06/19/2015  . Prediabetes [R73.03] 06/19/2015  . Obesity, unspecified [E66.9] 06/19/2015  . Absolute anemia [D64.9] 06/19/2015    Past Medical History Past Medical History:  Diagnosis Date  . Anemia   . GERD (gastroesophageal reflux disease)   . History of radiation therapy 03/08/2016 - 04/23/2016   Left Tonsil and Bilateral Neck  . HPV in female   . 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 CESAREAN SECTION  [id number]  . ESOPHAGOGASTRODUODENOSCOPY N/A 11/07/2015   Dr. Gala Romney: normal esophagus, chronic gastritis   . IR GASTROSTOMY TUBE REMOVAL  10/08/2016  . IR GENERIC HISTORICAL  02/11/2016   IR FLUORO GUIDE PORT INSERTION RIGHT 02/11/2016 Sandi Mariscal, MD [facility]  . IR GENERIC HISTORICAL  02/11/2016   IR US GUIDE VASC ACCESS RIGHT 02/11/2016 Sandi Mariscal, MD [facility]  . IR GENERIC HISTORICAL  02/11/2016   IR GASTROSTOMY TUBE  MOD SED 02/11/2016 Sandi Mariscal, MD [facility]  . IR REMOVAL TUN ACCESS W/ PORT W/O FL MOD SED  01/12/2017  . MULTIPLE EXTRACTIONS WITH ALVEOLOPLASTY N/A 02/20/2016   Procedure: Extraction of tooth #'s 15,17,18 and 32 with alveoloplasty and dental cleaning of teeth;  Surgeon: Lenn Cal, DDS;  Location: [facility];  Service: Oral Surgery;  Laterality: N/A;    Family History Family History  Problem Relation Age of Onset  . Diabetes Mother   . Hypertension Mother   . Prostatitis Father   . ADD / ADHD Daughter   . Cerebral palsy Son      Social History  reports that she has never smoked. She has never used smokeless tobacco. She reports that she does not drink alcohol or use drugs.  Medications  Current Outpatient Medications:  .  ferrous sulfate 325 (65 FE) MG tablet, Take 1  tablet (325 mg total) by mouth daily., Disp: 30 tablet, Rfl: 0 .  levonorgestrel (MIRENA) 20 MCG/24HR IUD, 1 each by Intrauterine route once., Disp: , Rfl:  .  Multiple Vitamins-Minerals (MULTIVITAMIN WITH MINERALS) tablet, Take 1 tablet by mouth daily., Disp: , Rfl:  .  sodium fluoride (FLUORISHIELD) 1.1 % GEL dental gel, Instill one drop of gel per tooth space of fluoride tray. Place over teeth for 5 minutes. Remove. Spit out excess. Repeat nightly., Disp: 120 mL, Rfl: PRN  Allergies No known allergies  Review of Systems Review of Systems - Oncology ROS as per HPI otherwise 12 point ROS is negative other than vaginal symptoms.     Physical Exam  Vitals Wt Readings from Last 3 Encounters:  04/19/17 138 lb 6.4 oz (62.8 kg)  03/21/17 139 lb (63 kg)  03/10/17 136 lb (61.7 kg)   Temp Readings from Last 3 Encounters:  04/19/17 98.6 F (37 C) (Oral)  03/10/17 99.1 F (37.3 C)  03/04/17 98.5 F (36.9 C)   BP Readings from Last 3 Encounters:  04/19/17 136/83  03/21/17 110/80  03/10/17 90/60   Pulse Readings from Last 3 Encounters:  04/19/17 71  03/21/17 97  03/10/17 83   Constitutional: Well-developed, well-nourished, and in no distress.   HENT: Head: Normocephalic and atraumatic.  Mouth/Throat: No oropharyngeal exudate. Mucosa moist. Eyes: Pupils are equal, round, and reactive to light. Conjunctivae are normal. No scleral icterus.  Neck: Normal range of motion. Neck supple. No JVD present.  Cardiovascular: Normal rate, regular rhythm and normal heart sounds.  Exam reveals no gallop and no friction rub.   No murmur heard. Pulmonary/Chest: Effort normal and breath sounds normal. No respiratory distress. No wheezes.No rales.  Abdominal: Soft. Bowel sounds are normal. No distension. There is no tenderness. There is no guarding.  Musculoskeletal: No edema or tenderness.  Lymphadenopathy: No cervical, axillary or supraclavicular adenopathy.  Neurological: Alert and oriented to  person, place, and time. No cranial nerve deficit.  Skin: Skin is warm and dry. No rash noted. No erythema. No pallor.  Psychiatric: Affect and judgment normal.   Labs No visits with results within 3 Day(s) from this visit.  Latest known visit with results is:  Hospital Outpatient Visit on 03/04/2017  Component Date Value Ref Range Status  . TSH 03/04/2017 2.004  0.308 - 3.960 uIU/mL Final   Performed at [facility], [place] [address], [place], [place] [address]     Pathology Orders Placed This Encounter  Procedures  . NM PET Image Restag (PS) Skull Base To Thigh    Standing  Status:   Future    Standing Expiration Date:   04/19/2018    Order Specific Question:   If indicated for the ordered procedure, I authorize the administration of a radiopharmaceutical per Radiology protocol    Answer:   Yes    Order Specific Question:   Is the patient pregnant?    Answer:   No    Order Specific Question:   Preferred imaging location?    Answer:   [facility]    Order Specific Question:   Radiology Contrast Protocol - do NOT remove file path    Answer:   [email or web address]  . CBC with Differential/Platelet    Standing Status:   Future    Standing Expiration Date:   04/20/2018  . Comprehensive metabolic panel    Standing Status:   Future    Standing Expiration Date:   04/20/2018  . Lactate dehydrogenase    Standing Status:   Future    Standing Expiration Date:   04/20/2018       Zoila Shutter MD

## 2017-04-21 ENCOUNTER — Encounter: Payer: Self-pay | Admitting: Physician Assistant

## 2017-04-21 ENCOUNTER — Ambulatory Visit: Payer: Self-pay | Admitting: Physician Assistant

## 2017-04-21 VITALS — BP 106/70 | HR 76 | Temp 98.1°F | Ht 62.0 in | Wt 138.0 lb

## 2017-04-21 DIAGNOSIS — N939 Abnormal uterine and vaginal bleeding, unspecified: Secondary | ICD-10-CM

## 2017-04-21 DIAGNOSIS — C090 Malignant neoplasm of tonsillar fossa: Secondary | ICD-10-CM

## 2017-04-21 NOTE — Progress Notes (Signed)
BP 106/70   Pulse 76   Temp 98.1 F (36.7 C)   Ht 5\' 2"  (1.575 m)   Wt 138 lb (62.6 kg)   SpO2 97%   BMI 25.24 kg/m    Subjective:    Patient ID: Heather Strickland, female    DOB: 1975-05-14, 42 y.o.   MRN: [id number]  HPI: Heather Strickland is a 42 y.o. female presenting on 04/21/2017 for Follow-up   HPI   Pt has turned in her [facility] [employer] [facility] application  She has appointment with gyn on 05/02/17  MRI- showed thickening of myometrial junctional zone as consistent with adenomyosis  Relevant past medical, surgical, family and social history reviewed and updated as indicated. Interim medical history since our last visit reviewed. Allergies and medications reviewed and updated.   Current Outpatient Medications:  .  ferrous sulfate 325 (65 FE) MG tablet, Take 1 tablet (325 mg total) by mouth daily., Disp: 30 tablet, Rfl: 0 .  levonorgestrel (MIRENA) 20 MCG/24HR IUD, 1 each by Intrauterine route once., Disp: , Rfl:  .  Multiple Vitamins-Minerals (MULTIVITAMIN WITH MINERALS) tablet, Take 1 tablet by mouth daily., Disp: , Rfl:  .  sodium fluoride (FLUORISHIELD) 1.1 % GEL dental gel, Instill one drop of gel per tooth space of fluoride tray. Place over teeth for 5 minutes. Remove. Spit out excess. Repeat nightly., Disp: 120 mL, Rfl: PRN   Review of Systems  Constitutional: Positive for fatigue. Negative for appetite change, chills, diaphoresis, fever and unexpected weight change.  HENT: Negative for congestion, dental problem, drooling, ear pain, facial swelling, hearing loss, mouth sores, sneezing, sore throat, trouble swallowing and voice change.   Eyes: Negative for pain, discharge, redness, itching and visual disturbance.  Respiratory: Negative for cough, choking, shortness of breath and wheezing.   Cardiovascular: Negative for chest pain, palpitations and leg swelling.  Gastrointestinal: Negative for abdominal pain, blood in stool, constipation, diarrhea and  vomiting.  Endocrine: Negative for cold intolerance, heat intolerance and polydipsia.  Genitourinary: Negative for decreased urine volume, dysuria and hematuria.  Musculoskeletal: Negative for arthralgias, back pain and gait problem.  Skin: Negative for rash.  Allergic/Immunologic: Negative for environmental allergies.  Neurological: Negative for seizures, syncope, light-headedness and headaches.  Hematological: Negative for adenopathy.  Psychiatric/Behavioral: Negative for agitation, dysphoric mood and suicidal ideas. The patient is not nervous/anxious.     Per HPI unless specifically indicated above     Objective:    BP 106/70   Pulse 76   Temp 98.1 F (36.7 C)   Ht 5\' 2"  (1.575 m)   Wt 138 lb (62.6 kg)   SpO2 97%   BMI 25.24 kg/m   Wt Readings from Last 3 Encounters:  04/21/17 138 lb (62.6 kg)  04/19/17 138 lb 6.4 oz (62.8 kg)  03/21/17 139 lb (63 kg)    Physical Exam  Constitutional: She is oriented to person, place, and time. She appears well-developed and well-nourished.  HENT:  Head: Normocephalic and atraumatic.  Neck: Neck supple.  Cardiovascular: Normal rate and regular rhythm.  Pulmonary/Chest: Effort normal and breath sounds normal.  Abdominal: Soft. Bowel sounds are normal. She exhibits no mass. There is no hepatosplenomegaly. There is no tenderness.  Musculoskeletal: She exhibits no edema.  Lymphadenopathy:    She has no cervical adenopathy.  Neurological: She is alert and oriented to person, place, and time.  Skin: Skin is warm and dry.  Psychiatric: She has a normal mood and affect. Her behavior is normal.  Vitals reviewed.  Results for orders placed or performed during the hospital encounter of 03/04/17  TSH  Result Value Ref Range   TSH 2.004 0.308 - 3.960 uIU/mL      Assessment & Plan:   Encounter Diagnoses  Name Primary?  . Cancer of tonsillar fossa ([place]) Yes  . Abnormal uterine bleeding      -pt to continue with oncology as per their  recommendation -pt to go to gyn as scheduled -no changes to medications today -pt to follow up here 6 months.  RTO sooner prn

## 2017-05-02 ENCOUNTER — Ambulatory Visit: Payer: Self-pay | Admitting: Obstetrics and Gynecology

## 2017-05-23 ENCOUNTER — Ambulatory Visit: Payer: Self-pay | Admitting: Diagnostic Neuroimaging

## 2017-05-25 ENCOUNTER — Encounter: Payer: Self-pay | Admitting: Obstetrics and Gynecology

## 2017-05-25 ENCOUNTER — Ambulatory Visit (INDEPENDENT_AMBULATORY_CARE_PROVIDER_SITE_OTHER): Payer: Self-pay | Admitting: Obstetrics and Gynecology

## 2017-05-25 VITALS — BP 110/70 | HR 97 | Ht 62.0 in | Wt 137.0 lb

## 2017-05-25 DIAGNOSIS — N898 Other specified noninflammatory disorders of vagina: Secondary | ICD-10-CM

## 2017-05-25 NOTE — Progress Notes (Signed)
Patient ID: Heather Strickland, female   DOB: Jun 18, 1975, 42 y.o.   MRN: [id number]   [facility] Visit  @DATE @            Patient name: Heather Strickland MRN [id number]  Date of birth: 1975/10/10  CC & HPI:  Heather Strickland is a 42 y.o. female presenting today for short, quick abdominal pain that is worse in the morning, described as both above and below the umbilicus. She also complains of a yellow vaginal discharge. No modifying factors noted. Her last menstrual cycle was about 3 weeks ago. The patient denies fever, chills or any other symptoms or complaints at this time.  Spanish interpreter present   ROS:  ROS  +abdominal pain +yellow vaginal discharge -fever -chills All systems are negative except as noted in the HPI and PMH.   Pertinent History Reviewed:   Reviewed: Significant for HPV Medical         Past Medical History:  Diagnosis Date  . Anemia   . GERD (gastroesophageal reflux disease)   . History of radiation therapy 03/08/2016 - 04/23/2016   Left Tonsil and Bilateral Neck  . HPV in female   . 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 CESAREAN SECTION  [id number]  . ESOPHAGOGASTRODUODENOSCOPY N/A 11/07/2015   Dr. Gala Romney: normal esophagus, chronic gastritis   . IR GASTROSTOMY TUBE REMOVAL  10/08/2016  . IR GENERIC HISTORICAL  02/11/2016   IR FLUORO GUIDE PORT INSERTION RIGHT 02/11/2016 Sandi Mariscal, MD [facility]  . IR GENERIC HISTORICAL  02/11/2016   IR US GUIDE VASC ACCESS RIGHT 02/11/2016 Sandi Mariscal, MD [facility]  . IR GENERIC HISTORICAL  02/11/2016   IR GASTROSTOMY TUBE MOD SED 02/11/2016 Sandi Mariscal, MD [facility]  . IR REMOVAL TUN ACCESS W/ PORT W/O FL MOD SED  01/12/2017  . MULTIPLE EXTRACTIONS WITH ALVEOLOPLASTY N/A 02/20/2016   Procedure: Extraction of tooth #'s 15,17,18 and 32 with alveoloplasty and dental cleaning of  teeth;  Surgeon: Lenn Cal, DDS;  Location: [place];  Service: Oral Surgery;  Laterality: N/A;   Medications: Reviewed & Updated - see associated section                       Current Outpatient Medications:  .  ferrous sulfate 325 (65 FE) MG tablet, Take 1 tablet (325 mg total) by mouth daily., Disp: 30 tablet, Rfl: 0 .  levonorgestrel (MIRENA) 20 MCG/24HR IUD, 1 each by Intrauterine route once., Disp: , Rfl:  .  Multiple Vitamins-Minerals (MULTIVITAMIN WITH MINERALS) tablet, Take 1 tablet by mouth daily., Disp: , Rfl:  .  sodium fluoride (FLUORISHIELD) 1.1 % GEL dental gel, Instill one drop of gel per tooth space of fluoride tray. Place over teeth for 5 minutes. Remove. Spit out excess. Repeat nightly., Disp: 120 mL, Rfl: PRN   Social History: Reviewed -  reports that she has never smoked. She has never used smokeless tobacco.  Objective Findings:  Vitals: Blood pressure 110/70, pulse 97, height 5\' 2"  (1.575 m), weight 137 lb (62.1 kg).  PHYSICAL EXAMINATION General appearance - alert, well appearing, and in no distress, oriented to person, place, and time and normal appearing weight Mental status - alert, oriented to  person, place, and time, normal mood, behavior, speech, dress, motor activity, and thought processes, affect appropriate to mood   PELVIC External genitalia - normal, multip Vagina - light discharge with menstrual flow [place] - normal epitheliales, no trichomoniasis    Assessment & Plan:   A:  1. Normal Exam 2  Normal secretions. P:  1. F/u with Health Department   By signing my name below, I, Margit Banda, attest that this documentation has been prepared under the direction and in the presence of Jonnie Kind, MD. Electronically Signed: Margit Banda, Medical Scribe. 05/25/17. 2:46 PM.  I personally performed the services described in this documentation, which was SCRIBED in my presence. The recorded information has been reviewed and  considered accurate. It has been edited as necessary during review. Jonnie Kind, MD

## 2017-05-26 ENCOUNTER — Ambulatory Visit (INDEPENDENT_AMBULATORY_CARE_PROVIDER_SITE_OTHER): Payer: Self-pay | Admitting: Otolaryngology

## 2017-05-27 LAB — GC/CHLAMYDIA PROBE AMP
Chlamydia trachomatis, NAA: NEGATIVE
NEISSERIA GONORRHOEAE BY PCR: NEGATIVE

## 2017-07-26 ENCOUNTER — Ambulatory Visit: Payer: Self-pay | Admitting: Diagnostic Neuroimaging

## 2017-07-27 ENCOUNTER — Encounter: Payer: Self-pay | Admitting: Diagnostic Neuroimaging

## 2017-08-15 ENCOUNTER — Encounter (HOSPITAL_COMMUNITY)
Admission: RE | Admit: 2017-08-15 | Discharge: 2017-08-15 | Disposition: A | Discharge status: Home / Self Care | Payer: Self-pay | Source: Ambulatory Visit | Attending: Internal Medicine | Admitting: Internal Medicine

## 2017-08-15 DIAGNOSIS — C090 Malignant neoplasm of tonsillar fossa: Secondary | ICD-10-CM | POA: Insufficient documentation

## 2017-08-15 MED ORDER — FLUDEOXYGLUCOSE F - 18 (FDG) INJECTION
8.7500 | Freq: Once | INTRAVENOUS | Status: AC | PRN
  Administered 2017-08-15: 8.75 via INTRAVENOUS

## 2017-08-24 ENCOUNTER — Ambulatory Visit (HOSPITAL_COMMUNITY): Payer: Self-pay | Admitting: Internal Medicine

## 2017-08-25 ENCOUNTER — Encounter (HOSPITAL_COMMUNITY): Payer: Self-pay | Admitting: Internal Medicine

## 2017-08-25 ENCOUNTER — Inpatient Hospital Stay (HOSPITAL_COMMUNITY): Payer: Self-pay | Attending: Internal Medicine | Admitting: Internal Medicine

## 2017-08-25 VITALS — BP 119/76 | HR 77 | Temp 98.1°F | Resp 16 | Wt 135.0 lb

## 2017-08-25 DIAGNOSIS — C090 Malignant neoplasm of tonsillar fossa: Secondary | ICD-10-CM | POA: Insufficient documentation

## 2017-08-25 DIAGNOSIS — Z923 Personal history of irradiation: Secondary | ICD-10-CM | POA: Insufficient documentation

## 2017-08-25 DIAGNOSIS — J320 Chronic maxillary sinusitis: Secondary | ICD-10-CM | POA: Insufficient documentation

## 2017-08-25 DIAGNOSIS — K219 Gastro-esophageal reflux disease without esophagitis: Secondary | ICD-10-CM | POA: Insufficient documentation

## 2017-08-25 DIAGNOSIS — Z79899 Other long term (current) drug therapy: Secondary | ICD-10-CM | POA: Insufficient documentation

## 2017-08-25 NOTE — Progress Notes (Signed)
Heather Strickland presents for follow up of radiation completed 04/23/16 to her left tonsil and bilateral neck.   Pain issues, if any:  Using a feeding tube?:  Weight changes, if any:  Swallowing issues, if any:  Smoking or chewing tobacco?  Using fluoride trays daily?  Last ENT visit was on:  Other notable issues, if any:  03/17/17 MRI Pelvis 08/15/17 PET  08/25/17 Dr. Walden Field (hematology) Pt is here for follow-up.  Pet scan done 08/15/2017 shows  IMPRESSION: 1. No evidence of local recurrence in the pharyngeal mucosal space or metastatic disease. No abnormal metabolic activity. 2. Interval resolution of left adnexal cystic lesion.  Pt is asymptomatic.  She should follow-up with Dr. Benjamine Mola and Dr. Isidore Moos as recommended.  She denies smoking or alcohol use.  She is set up CT neck and chest in 02/2018 and will follow-up at that time to go over results

## 2017-08-25 NOTE — Progress Notes (Signed)
Diagnosis Cancer of tonsillar fossa ([place]) - Plan: CT SOFT TISSUE NECK W CONTRAST, CT CHEST W CONTRAST, CBC with Differential/Platelet, Comprehensive metabolic panel, Lactate dehydrogenase  Staging Cancer Staging Cancer of tonsillar fossa ([place]) Staging form: Pharynx - HPV-Mediated Oropharynx, AJCC 8th Edition - Clinical stage from 01/30/2016: Stage I (cT2, cN1, cM0, p16: Positive) - Signed by Baird Cancer, PA-C on 02/18/2016 - Pathologic: No stage assigned - Unsigned - Clinical: cT3 - Unsigned   Assessment and Plan:  1.  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  Pt was diagnosed in 01/2016. Treated with radiation therapy alone by Dr. Isidore Moos; completed treatment on 04/23/16. PET scan 08/17/16 showed  IMPRESSION: 1. Interval resolution of the left pharyngeal mass. No significant residual hypermetabolic activity or CT abnormality is observed in this location. No regional adenopathy or evidence of distant metastatic spread. 2. Mild chronic right maxillary sinusitis. 3. 5.5 by 4.2 cm left adnexal cyst is new and photopenic. Based on current guidelines, pelvic sonography is recommended in 6-12 weeks for follow up.   Pt had Pelvic USN done on 02/04/2017 that showed  IMPRESSION: Bilateral ovaries not visualized, obscured by bowel gas.  Heterogeneous appearance of the endometrium, with poorly defined transitional zone. OBGYN consultation and possibly MRI of the pelvis may be considered for further evaluation.  IUD within the endometrial canal.  Pt is here for follow-up.  Pet scan done 08/15/2017 shows  IMPRESSION: 1. No evidence of local recurrence in the pharyngeal mucosal space or metastatic disease. No abnormal metabolic activity. 2. Interval resolution of left adnexal cystic lesion.  Pt is asymptomatic.  She should follow-up with Dr. Benjamine Mola and Dr. Isidore Moos as recommended.  She denies smoking or alcohol use.  She is set up CT neck and chest in 02/2018 and will  follow-up at that time to go over results.    2.  Left adnexal cystic lesion.  Pt had Pelvic USN done 02/04/2017 showed   IMPRESSION: Bilateral ovaries not visualized, obscured by bowel gas.  Heterogeneous appearance of the endometrium, with poorly defined transitional zone. OBGYN consultation and possibly MRI of the pelvis may be considered for further evaluation.  She has been seen by GYN and should follow-up as recommended.  PET scan done 08/15/2017 shows resolution of left adnexal lesion.    3.  Health maintenance.  Follow-up with PCP and GYN as recommended.  Mammograms as recommended.     Interval History:  Historical data obtained from the note dated 04/19/2017.  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  Pt was diagnosed in 01/2016. Treated with radiation therapy alone by Dr. Isidore Moos; completed treatment on 04/23/16.  Current Status:  Pt is seen today for follow-up.  She is here to go over PET scan.  History obtained through interpreter.  She denies any trouble swallowing or pain.       Cancer of tonsillar fossa ([place])   09/1926 Procedure    Gastrostomy tube removed.    01/15/2016 Procedure    Left tonsil biopsy by Dr. Benjamine Mola    01/15/2016 Initial Diagnosis    She presented to Dr. Benjamine Mola after being referred by [name], NP (GI) with a 5-6 month history of severe sore throat.  The patient never sought treatment for this issue previously.  Over the last 3 months, patient reported intermittent bleeding from left tonsil.  She also feels a hard mass along the left side of neck.  On physical exam by Dr. Benjamine Mola,  he noted 3+ ulceration and erythema on the left tonsil.  On flexible laryngoscopy, tonsillar hypertrophy with ulceration was noted on the left.    01/19/2016 Pathology Results    Invasive squamous cell carcinoma, poorly differentiated.    01/20/2016 Pathology Results    Tumor cells are POSITIVE for p16 stains (HPV positive)    01/29/2016 Imaging    CT neck-  Motion  artifact at the level of oropharynx and base of tongue. Fine soft tissue details lost at these levels.  Pharyngeal mass centered in the left palatini tonsil with surround mucosal thickening as described below probably representing neoplasm.  Mucosal thickening of the left aspect of hypopharynx and pharynx with inferior most extension to the margin of the supraglottic airway without appreciable invasion into epiglottis, aryepiglottic folds, vocal cords, or paraglottic fat.  Superior most extension of mucosal thickening to the left aspect of the uvula and along the left aspect of the soft palate to the hard/soft palate junction, this region is obscured by motion artifact.  Anteriorly there is effacement of left glossopharyngeal sulcus without appreciable tongue base invasion, this region is obscured by motion artifact.  Asymmetric enlargement of left-sided level 2 and 3 cervical lymph nodes possibly metastatic. No evidence for lymph node necrosis or extra nodal extension.    01/29/2016 Imaging    CT chest-  No evidence of metastatic disease in the chest.    02/06/2016 PET scan    1. Highly hypermetabolic left palatine tonsillar mass, maximum SUV 38.5. 2. A left station IIa lymph node measure 1.1 cm in short axis, mildly enlarged on image 38/3, but has only a borderline elevated SUV at 3.5. 3. Other imaging findings of potential clinical significance: Mild cardiomegaly. Calcified granuloma in the superior segment right lower lobe (benign). IUD satisfactorily positioned in the uterus.    02/11/2016 Procedure    Successful fluoroscopic insertion of a 20-French pull-through gastrostomy tube.    02/11/2016 Procedure    Successful placement of a right internal jugular approach power injectable Port-A-Cath. The catheter is ready for immediate use.    03/08/2016 - 04/23/2016 Radiation Therapy    Radiation alone Isidore Moos). Left Tonsil and Bilateral neck / 70 Gy in 35 fractions to  gross disease, 63 Gy in 35 fractions to high risk nodal echelons, and 56 Gy in 35 fractions to intermediate risk nodal echelons    10/08/2016 Procedure    Pull-through gastrostomy tube removed.    01/12/2017 Procedure    Port removed by IR      Problem List Patient Active Problem List   Diagnosis Date Noted  . Port catheter in place [facility] 04/22/2016  . 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 Oral candidiasis [B37.0] 03/30/2016  . Weight loss [R63.4] 03/30/2016  . Hemoptysis [R04.2] 03/11/2016  . Cancer of tonsillar fossa ([place]) [C09.0] 01/27/2016  . GERD (gastroesophageal reflux disease) [K21.9] 12/22/2015  . H. pylori infection [A04.8] 07/21/2015  . Esophageal reflux [K21.9] 06/19/2015  . Prediabetes [R73.03] 06/19/2015  . Obesity, unspecified [E66.9] 06/19/2015  . Absolute anemia [D64.9] 06/19/2015    Past Medical History Past Medical History:  Diagnosis Date  . Anemia   . GERD (gastroesophageal reflux disease)   . History of radiation therapy 03/08/2016 - 04/23/2016   Left Tonsil and Bilateral Neck  . HPV in female   . 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 CESAREAN SECTION  [id number]  .  ESOPHAGOGASTRODUODENOSCOPY N/A 11/07/2015   Dr. Gala Romney: normal esophagus, chronic gastritis   . IR GASTROSTOMY TUBE REMOVAL  10/08/2016  . IR GENERIC HISTORICAL  02/11/2016   IR FLUORO GUIDE PORT INSERTION RIGHT 02/11/2016 Sandi Mariscal, MD [facility]  . IR GENERIC HISTORICAL  02/11/2016   IR US GUIDE VASC ACCESS RIGHT 02/11/2016 Sandi Mariscal, MD [facility]  . IR GENERIC HISTORICAL  02/11/2016   IR GASTROSTOMY TUBE MOD SED 02/11/2016 Sandi Mariscal, MD [facility]  . IR REMOVAL TUN ACCESS W/ PORT W/O FL MOD SED  01/12/2017  . MULTIPLE EXTRACTIONS WITH ALVEOLOPLASTY N/A 02/20/2016   Procedure: Extraction of tooth #'s 15,17,18 and 32 with alveoloplasty and dental cleaning of teeth;   Surgeon: Lenn Cal, DDS;  Location: [facility];  Service: Oral Surgery;  Laterality: N/A;    Family History Family History  Problem Relation Age of Onset  . Diabetes Mother   . Hypertension Mother   . Prostatitis Father   . ADD / ADHD Daughter   . Cerebral palsy Son      Social History  reports that she has never smoked. She has never used smokeless tobacco. She reports that she does not drink alcohol or use drugs.  Medications  Current Outpatient Medications:  .  ferrous sulfate 325 (65 FE) MG tablet, Take 1 tablet (325 mg total) by mouth daily., Disp: 30 tablet, Rfl: 0 .  levonorgestrel (MIRENA) 20 MCG/24HR IUD, 1 each by Intrauterine route once., Disp: , Rfl:  .  Multiple Vitamins-Minerals (MULTIVITAMIN WITH MINERALS) tablet, Take 1 tablet by mouth daily., Disp: , Rfl:  .  sodium fluoride (FLUORISHIELD) 1.1 % GEL dental gel, Instill one drop of gel per tooth space of fluoride tray. Place over teeth for 5 minutes. Remove. Spit out excess. Repeat nightly., Disp: 120 mL, Rfl: PRN  Allergies No known allergies  Review of Systems Review of Systems - Oncology ROS negative   Physical Exam  Vitals Wt Readings from Last 3 Encounters:  08/25/17 135 lb (61.2 kg)  05/25/17 137 lb (62.1 kg)  04/21/17 138 lb (62.6 kg)   Temp Readings from Last 3 Encounters:  08/25/17 98.1 F (36.7 C) (Oral)  04/21/17 98.1 F (36.7 C)  04/19/17 98.6 F (37 C) (Oral)   BP Readings from Last 3 Encounters:  08/25/17 119/76  05/25/17 110/70  04/21/17 106/70   Pulse Readings from Last 3 Encounters:  08/25/17 77  05/25/17 97  04/21/17 76   Constitutional: Well-developed, well-nourished, and in no distress.   HENT: Head: Normocephalic and atraumatic.  Mouth/Throat: No oropharyngeal exudate. Mucosa moist. Eyes: Pupils are equal, round, and reactive to light. Conjunctivae are normal. No scleral icterus.  Neck: Normal range of motion. Neck supple. No JVD present.  Cardiovascular: Normal  rate, regular rhythm and normal heart sounds.  Exam reveals no gallop and no friction rub.   No murmur heard. Pulmonary/Chest: Effort normal and breath sounds normal. No respiratory distress. No wheezes.No rales.  Abdominal: Soft. Bowel sounds are normal. No distension. There is no tenderness. There is no guarding.  Musculoskeletal: No edema or tenderness.  Lymphadenopathy:No cervical, axillary or supraclavicular adenopathy.  Neurological: Alert and oriented to person, place, and time. No cranial nerve deficit.  Skin: Skin is warm and dry. No rash noted. No erythema. No pallor.  Psychiatric: Affect and judgment normal.   Labs No visits with results within 3 Day(s) from this visit.  Latest known visit with results is:  Office Visit on 05/25/2017  Component Date  Value Ref Range Status  . Chlamydia trachomatis, NAA 05/25/2017 Negative  Negative Final  . Neisseria gonorrhoeae by PCR 05/25/2017 Negative  Negative Final     Pathology Orders Placed This Encounter  Procedures  . CT SOFT TISSUE NECK W CONTRAST    Standing Status:   Future    Standing Expiration Date:   11/26/2018    Order Specific Question:   If indicated for the ordered procedure, I authorize the administration of contrast media per Radiology protocol    Answer:   Yes    Order Specific Question:   Is patient pregnant?    Answer:   No    Order Specific Question:   Preferred imaging location?    Answer:   [facility]    Order Specific Question:   Radiology Contrast Protocol - do NOT remove file path    Answer:   [email or web address]  . CT CHEST W CONTRAST    Standing Status:   Future    Standing Expiration Date:   08/25/2018    Order Specific Question:   If indicated for the ordered procedure, I authorize the administration of contrast media per Radiology protocol    Answer:   Yes    Order Specific Question:   Preferred imaging location?    Answer:   [facility]    Order Specific  Question:   Radiology Contrast Protocol - do NOT remove file path    Answer:   [email or web address]  . CBC with Differential/Platelet    Standing Status:   Future    Standing Expiration Date:   08/26/2019  . Comprehensive metabolic panel    Standing Status:   Future    Standing Expiration Date:   08/26/2019  . Lactate dehydrogenase    Standing Status:   Future    Standing Expiration Date:   08/26/2019       Zoila Shutter MD

## 2017-08-25 NOTE — Patient Instructions (Signed)
[place] at [facility]  Discharge Instructions:  You were seen by Dr. Walden Field today.  _______________________________________________________________  Thank you for choosing [place] at [facility] to provide your oncology and hematology care.  To afford each patient quality time with our providers, please arrive at least 15 minutes before your scheduled appointment.  You need to re-schedule your appointment if you arrive 10 or more minutes late.  We strive to give you quality time with our providers, and arriving late affects you and other patients whose appointments are after yours.  Also, if you no show three or more times for appointments you may be dismissed from the clinic.  Again, thank you for choosing [place] at [place] hope is that these requests will allow you access to exceptional care and in a timely manner. _______________________________________________________________  If you have questions after your visit, please contact our office at [phone] between the hours of 8:30 a.m. and 5:00 p.m. Voicemails left after 4:30 p.m. will not be returned until the following business day. _______________________________________________________________  For prescription refill requests, have your pharmacy contact our office. _______________________________________________________________  Recommendations made by the consultant and any test results will be sent to your referring physician. _______________________________________________________________

## 2017-09-02 ENCOUNTER — Ambulatory Visit
Admission: RE | Admit: 2017-09-02 | Discharge: 2017-09-02 | Disposition: A | Discharge status: Home / Self Care | Payer: Self-pay | Source: Ambulatory Visit | Attending: Radiation Oncology | Admitting: Radiation Oncology

## 2017-09-06 ENCOUNTER — Telehealth: Payer: Self-pay

## 2017-09-06 NOTE — Telephone Encounter (Signed)
Heather Strickland missed her recent follow up appointment with Dr. Isidore Moos. I called and spoke to her daughter in law and she voiced that Heather Strickland has recently missed work due to multiple doctors appointments and would like to postpone her visit with Dr. Isidore Moos. I notified Dr. Isidore Moos and she asked me to inform Heather Strickland that because it is difficult to her to come to her appointments, she should continue to follow up with medical oncology at [facility] as recommended. I then left a voice mail with Heather Strickland daughter in law to continue regular follow up at [facility] with medical oncology. I left my direct number to call with any further questions or concerns, or if she would like to be seen by Dr. Isidore Moos.

## 2017-10-20 ENCOUNTER — Ambulatory Visit: Payer: Self-pay | Admitting: Physician Assistant

## 2017-10-20 ENCOUNTER — Encounter: Payer: Self-pay | Admitting: Physician Assistant

## 2017-10-20 VITALS — BP 121/73 | HR 80 | Temp 98.8°F | Ht 62.0 in | Wt 138.5 lb

## 2017-10-20 DIAGNOSIS — Z1239 Encounter for other screening for malignant neoplasm of breast: Secondary | ICD-10-CM

## 2017-10-20 DIAGNOSIS — C090 Malignant neoplasm of tonsillar fossa: Secondary | ICD-10-CM

## 2017-10-20 DIAGNOSIS — Z13220 Encounter for screening for lipoid disorders: Secondary | ICD-10-CM

## 2017-10-20 NOTE — Progress Notes (Signed)
BP 121/73 (BP Location: Right Arm, Patient Position: Sitting, Cuff Size: Normal)   Pulse 80   Temp 98.8 F (37.1 C)   Ht 5\' 2"  (1.575 m)   Wt 138 lb 8 oz (62.8 kg)   SpO2 99%   BMI 25.33 kg/m    Subjective:    Patient ID: Heather Strickland, female    DOB: 05/10/1975, 42 y.o.   MRN: [id number]  HPI: Heather Strickland is a 42 y.o. female presenting on 10/20/2017 for Follow-up   HPI   Pt is doing well.  She relates some feelings of dry throat but she is aware that is due to her cancer.  She says she is eating well.  She continues to follow up with oncology.  Relevant past medical, surgical, family and social history reviewed and updated as indicated. Interim medical history since our last visit reviewed. Allergies and medications reviewed and updated.   Current Outpatient Medications:  .  ferrous sulfate 325 (65 FE) MG tablet, Take 1 tablet (325 mg total) by mouth daily., Disp: 30 tablet, Rfl: 0 .  levonorgestrel (MIRENA) 20 MCG/24HR IUD, 1 each by Intrauterine route once., Disp: , Rfl:  .  Multiple Vitamins-Minerals (MULTIVITAMIN WITH MINERALS) tablet, Take 1 tablet by mouth daily., Disp: , Rfl:  .  sodium fluoride (FLUORISHIELD) 1.1 % GEL dental gel, Instill one drop of gel per tooth space of fluoride tray. Place over teeth for 5 minutes. Remove. Spit out excess. Repeat nightly., Disp: 120 mL, Rfl: PRN'  Review of Systems  Constitutional: Negative for appetite change, chills, diaphoresis, fatigue, fever and unexpected weight change.  HENT: Negative for congestion, dental problem, drooling, ear pain, facial swelling, hearing loss, mouth sores, sneezing, sore throat, trouble swallowing and voice change.   Eyes: Negative for pain, discharge, redness, itching and visual disturbance.  Respiratory: Negative for cough, choking, shortness of breath and wheezing.   Cardiovascular: Negative for chest pain, palpitations and leg swelling.  Gastrointestinal: Negative for  abdominal pain, blood in stool, constipation, diarrhea and vomiting.  Endocrine: Positive for cold intolerance. Negative for heat intolerance and polydipsia.  Genitourinary: Negative for decreased urine volume, dysuria and hematuria.  Musculoskeletal: Positive for back pain. Negative for arthralgias and gait problem.  Skin: Negative for rash.  Allergic/Immunologic: Negative for environmental allergies.  Neurological: Positive for headaches. Negative for seizures, syncope and light-headedness.  Hematological: Negative for adenopathy.  Psychiatric/Behavioral: Negative for agitation, dysphoric mood and suicidal ideas. The patient is not nervous/anxious.     Per HPI unless specifically indicated above     Objective:    BP 121/73 (BP Location: Right Arm, Patient Position: Sitting, Cuff Size: Normal)   Pulse 80   Temp 98.8 F (37.1 C)   Ht 5\' 2"  (1.575 m)   Wt 138 lb 8 oz (62.8 kg)   SpO2 99%   BMI 25.33 kg/m   Wt Readings from Last 3 Encounters:  10/20/17 138 lb 8 oz (62.8 kg)  08/25/17 135 lb (61.2 kg)  05/25/17 137 lb (62.1 kg)    Physical Exam  Constitutional: She is oriented to person, place, and time. She appears well-developed and well-nourished.  HENT:  Head: Normocephalic and atraumatic.  Neck: Neck supple.  Cardiovascular: Normal rate and regular rhythm.  Pulmonary/Chest: Effort normal and breath sounds normal.  Abdominal: Soft. Bowel sounds are normal. She exhibits no mass. There is no hepatosplenomegaly. There is no tenderness.  Musculoskeletal: She exhibits no edema.  Lymphadenopathy:    She has no cervical adenopathy.  Neurological: She is alert and oriented to person, place, and time.  Skin: Skin is warm and dry.  Psychiatric: She has a normal mood and affect. Her behavior is normal.  Vitals reviewed.       Assessment & Plan:   Encounter Diagnoses  Name Primary?  . Cancer of tonsillar fossa ([place]) Yes  . Screening for hyperlipidemia   . Screening for  breast cancer     -ordered screening mammogram -Will check fasting lipids -Pt to continue with oncology per their recommendations -pt to follow up 1 year.  RTO sooner prn

## 2017-10-28 ENCOUNTER — Other Ambulatory Visit (HOSPITAL_COMMUNITY): Payer: Self-pay | Admitting: *Deleted

## 2017-10-28 DIAGNOSIS — Z1231 Encounter for screening mammogram for malignant neoplasm of breast: Secondary | ICD-10-CM

## 2018-01-26 ENCOUNTER — Encounter (HOSPITAL_COMMUNITY): Payer: Self-pay | Admitting: *Deleted

## 2018-01-26 ENCOUNTER — Encounter (HOSPITAL_COMMUNITY): Payer: Self-pay

## 2018-01-26 ENCOUNTER — Ambulatory Visit
Admission: RE | Admit: 2018-01-26 | Discharge: 2018-01-26 | Disposition: A | Discharge status: Home / Self Care | Payer: No Typology Code available for payment source | Source: Ambulatory Visit | Attending: Obstetrics and Gynecology | Admitting: Obstetrics and Gynecology

## 2018-01-26 ENCOUNTER — Ambulatory Visit (HOSPITAL_COMMUNITY)
Admission: RE | Admit: 2018-01-26 | Discharge: 2018-01-26 | Disposition: A | Discharge status: Home / Self Care | Payer: Self-pay | Source: Ambulatory Visit | Attending: Obstetrics and Gynecology | Admitting: Obstetrics and Gynecology

## 2018-01-26 VITALS — BP 110/70 | Ht 65.0 in | Wt 144.0 lb

## 2018-01-26 DIAGNOSIS — Z1239 Encounter for other screening for malignant neoplasm of breast: Secondary | ICD-10-CM

## 2018-01-26 DIAGNOSIS — Z1231 Encounter for screening mammogram for malignant neoplasm of breast: Secondary | ICD-10-CM

## 2018-01-26 NOTE — Progress Notes (Signed)
Complaints of a brief left outer breast pain once or twice over the past two weeks. No complaints today.  Pap Smear: Pap smear not completed today. Last Pap smear was 09/02/2016 at [facility] and normal with negative HPV. Per patient has a history of an abnormal Pap smear over 10 years ago that a colposcopy was completed for follow-up. Per patient all Pap smears have been normal since colposcopy and that she has had at least three normal Pap smears. Last Pap smear result is in Epic.  Physical exam: Breasts Breasts symmetrical. No skin abnormalities bilateral breasts. No nipple retraction bilateral breasts. No nipple discharge bilateral breasts. No lymphadenopathy. No lumps palpated bilateral breasts. No complaints of pain or tenderness on exam. Referred patient to the [facility] for a screening mammogram. Appointment scheduled for Thursday, January 26, 2018 at [place].        Pelvic/Bimanual No Pap smear completed today since last Pap smear and HPV typing was 09/02/2016. Pap smear not indicated per BCCCP guidelines.   Smoking History: Patient has never smoked.  Patient Navigation: Patient education provided. Access to services provided for patient through [facility] program. Spanish interpreter provided.   Breast and Cervical Cancer Risk Assessment: Patient has no family history of breast cancer, known genetic mutations, or radiation treatment to the chest before age 60. Per patient has a history of cervical dysplasia. Patient has no history of being immunocompromised or DES exposure in-utero.  Risk Assessment    Risk Scores      01/26/2018   Last edited by: Loletta Parish, RN   5-year risk: 0.4 %   Lifetime risk: 5 %         Used Spanish interpreter Rudene Anda from [place].

## 2018-01-26 NOTE — Patient Instructions (Signed)
Explained breast self awareness with Heather Strickland. Patient did not need a Pap smear today due to last Pap smear and HPV typing was 09/02/2016. Let her know BCCCP will cover Pap smears and HPV typing every 5 years unless has a history of abnormal Pap smears. Referred patient to the [facility] for a screening mammogram. Appointment scheduled for Thursday, January 26, 2018 at [place]. Patient aware of appointment and will be there. Let patient know the [facility] will follow up with her within the next couple weeks with results of mammogram by letter or phone. Heather Strickland verbalized understanding.  Zorawar Strollo, Arvil Chaco, RN 10:24 AM

## 2018-03-03 ENCOUNTER — Ambulatory Visit (HOSPITAL_COMMUNITY)
Admission: RE | Admit: 2018-03-03 | Discharge: 2018-03-03 | Disposition: A | Discharge status: Home / Self Care | Payer: No Typology Code available for payment source | Source: Ambulatory Visit | Attending: Internal Medicine | Admitting: Internal Medicine

## 2018-03-03 ENCOUNTER — Other Ambulatory Visit (HOSPITAL_COMMUNITY): Payer: Self-pay

## 2018-03-03 DIAGNOSIS — C090 Malignant neoplasm of tonsillar fossa: Secondary | ICD-10-CM | POA: Insufficient documentation

## 2018-03-03 MED ORDER — IOHEXOL 300 MG/ML  SOLN
125.0000 mL | Freq: Once | INTRAMUSCULAR | Status: AC | PRN
  Administered 2018-03-03: 125 mL via INTRAVENOUS

## 2018-03-10 ENCOUNTER — Inpatient Hospital Stay (HOSPITAL_COMMUNITY): Payer: Self-pay | Attending: Internal Medicine | Admitting: Internal Medicine

## 2018-03-10 ENCOUNTER — Encounter (HOSPITAL_COMMUNITY): Payer: Self-pay | Admitting: Internal Medicine

## 2018-03-10 ENCOUNTER — Other Ambulatory Visit (HOSPITAL_COMMUNITY): Payer: Self-pay | Admitting: Internal Medicine

## 2018-03-10 VITALS — BP 107/63 | HR 63 | Temp 98.2°F | Resp 18 | Wt 147.1 lb

## 2018-03-10 DIAGNOSIS — Z793 Long term (current) use of hormonal contraceptives: Secondary | ICD-10-CM | POA: Insufficient documentation

## 2018-03-10 DIAGNOSIS — Z79899 Other long term (current) drug therapy: Secondary | ICD-10-CM | POA: Insufficient documentation

## 2018-03-10 DIAGNOSIS — Z923 Personal history of irradiation: Secondary | ICD-10-CM | POA: Insufficient documentation

## 2018-03-10 DIAGNOSIS — C090 Malignant neoplasm of tonsillar fossa: Secondary | ICD-10-CM | POA: Insufficient documentation

## 2018-03-10 DIAGNOSIS — K219 Gastro-esophageal reflux disease without esophagitis: Secondary | ICD-10-CM | POA: Insufficient documentation

## 2018-03-10 DIAGNOSIS — J320 Chronic maxillary sinusitis: Secondary | ICD-10-CM | POA: Insufficient documentation

## 2018-03-10 NOTE — Progress Notes (Signed)
Diagnosis Cancer of tonsillar fossa ([place]) - Plan: CBC with Differential, Comprehensive metabolic panel, Lactate dehydrogenase  Staging Cancer Staging Cancer of tonsillar fossa ([place]) Staging form: Pharynx - HPV-Mediated Oropharynx, AJCC 8th Edition - Clinical stage from 01/30/2016: Stage I (cT2, cN1, cM0, p16: Positive) - Signed by Baird Cancer, PA-C on 02/18/2016 - Pathologic: No stage assigned - Unsigned - Clinical: cT3 - Unsigned  Assessment and Plan:  1.  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  Pt was diagnosed in 01/2016. Treated with radiation therapy alone by Dr. Isidore Moos; completed treatment on 04/23/16. PET scan 08/17/16 showed  IMPRESSION: 1. Interval resolution of the left pharyngeal mass. No significant residual hypermetabolic activity or CT abnormality is observed in this location. No regional adenopathy or evidence of distant metastatic spread. 2. Mild chronic right maxillary sinusitis. 3. 5.5 by 4.2 cm left adnexal cyst is new and photopenic. Based on current guidelines, pelvic sonography is recommended in 6-12 weeks for follow up.   Pt had Pelvic USN done on 02/04/2017 that showed  IMPRESSION: Bilateral ovaries not visualized, obscured by bowel gas.  Heterogeneous appearance of the endometrium, with poorly defined transitional zone. OBGYN consultation and possibly MRI of the pelvis may be considered for further evaluation.  IUD within the endometrial canal.  Pet scan done 08/15/2017 showed  IMPRESSION: 1. No evidence of local recurrence in the pharyngeal mucosal space or metastatic disease. No abnormal metabolic activity. 2. Interval resolution of left adnexal cystic lesion.  CT neck and chest done 03/03/2018 reviewed and showed no evidence of recurrent tumor.  RT changes noted.  No metastatic disease noted in chest.    Pt should follow-up with Dr. Benjamine Mola and Dr. Isidore Moos as recommended.  She denies smoking or alcohol use.  She will RTC in  08/2018 with labs.   2.  Left adnexal cystic lesion.  Pt had Pelvic USN done 02/04/2017 showed   IMPRESSION: Bilateral ovaries not visualized, obscured by bowel gas.  Heterogeneous appearance of the endometrium, with poorly defined transitional zone. OBGYN consultation and possibly MRI of the pelvis may be considered for further evaluation.  She has been seen by GYN and should follow-up as recommended.  PET scan done 08/15/2017 shows resolution of left adnexal lesion.  Pt will RTC in 08/2018 with labs.  Determine at that time if repeat imaging will be recommended.   3.  Health maintenance.  Follow-up with PCP and GYN as recommended.  Mammograms as recommended.    4.  Questions regarding dental gel.  This was prescribed by Dr. Enrique Sack when pt went through RT.  Pt to contact his office to determine if she would continue use.    25 minutes spent with more than 50% spent in review of records, counseling and coordination of care.     Interval History:  Historical data obtained from the note dated 04/19/2017.  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  Pt was diagnosed in 01/2016. Treated with radiation therapy alone by Dr. Isidore Moos; completed treatment on 04/23/16.  Current Status:  Pt is seen today for follow-up.  She is here to go over scans.  History obtained through interpreter.  She denies any trouble swallowing or pain.  She is wondering if she needs to continue dental gel.       Cancer of tonsillar fossa ([place])   09/1928 Procedure    Gastrostomy tube removed.    01/15/2016 Procedure    Left tonsil biopsy by Dr.  Teoh    01/15/2016 Initial Diagnosis    She presented to Dr. Benjamine Mola after being referred by [name], NP (GI) with a 5-6 month history of severe sore throat.  The patient never sought treatment for this issue previously.  Over the last 3 months, patient reported intermittent bleeding from left tonsil.  She also feels a hard mass along the left side of neck.  On  physical exam by Dr. Benjamine Mola, he noted 3+ ulceration and erythema on the left tonsil.  On flexible laryngoscopy, tonsillar hypertrophy with ulceration was noted on the left.    01/19/2016 Pathology Results    Invasive squamous cell carcinoma, poorly differentiated.    01/20/2016 Pathology Results    Tumor cells are POSITIVE for p16 stains (HPV positive)    01/29/2016 Imaging    CT neck-  Motion artifact at the level of oropharynx and base of tongue. Fine soft tissue details lost at these levels.  Pharyngeal mass centered in the left palatini tonsil with surround mucosal thickening as described below probably representing neoplasm.  Mucosal thickening of the left aspect of hypopharynx and pharynx with inferior most extension to the margin of the supraglottic airway without appreciable invasion into epiglottis, aryepiglottic folds, vocal cords, or paraglottic fat.  Superior most extension of mucosal thickening to the left aspect of the uvula and along the left aspect of the soft palate to the hard/soft palate junction, this region is obscured by motion artifact.  Anteriorly there is effacement of left glossopharyngeal sulcus without appreciable tongue base invasion, this region is obscured by motion artifact.  Asymmetric enlargement of left-sided level 2 and 3 cervical lymph nodes possibly metastatic. No evidence for lymph node necrosis or extra nodal extension.    01/29/2016 Imaging    CT chest-  No evidence of metastatic disease in the chest.    02/06/2016 PET scan    1. Highly hypermetabolic left palatine tonsillar mass, maximum SUV 38.5. 2. A left station IIa lymph node measure 1.1 cm in short axis, mildly enlarged on image 38/3, but has only a borderline elevated SUV at 3.5. 3. Other imaging findings of potential clinical significance: Mild cardiomegaly. Calcified granuloma in the superior segment right lower lobe (benign). IUD satisfactorily positioned in the uterus.     02/11/2016 Procedure    Successful fluoroscopic insertion of a 20-French pull-through gastrostomy tube.    02/11/2016 Procedure    Successful placement of a right internal jugular approach power injectable Port-A-Cath. The catheter is ready for immediate use.    03/08/2016 - 04/23/2016 Radiation Therapy    Radiation alone Isidore Moos). Left Tonsil and Bilateral neck / 70 Gy in 35 fractions to gross disease, 63 Gy in 35 fractions to high risk nodal echelons, and 56 Gy in 35 fractions to intermediate risk nodal echelons    10/08/2016 Procedure    Pull-through gastrostomy tube removed.    01/12/2017 Procedure    Port removed by IR      Problem List Patient Active Problem List   Diagnosis Date Noted  . Port catheter in place [facility] 04/22/2016  . 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 Oral candidiasis [B37.0] 03/30/2016  . Weight loss [R63.4] 03/30/2016  . Hemoptysis [R04.2] 03/11/2016  . Cancer of tonsillar fossa ([place]) [C09.0] 01/27/2016  . GERD (gastroesophageal reflux disease) [K21.9] 12/22/2015  . H. pylori infection [A04.8] 07/21/2015  . Esophageal reflux [K21.9] 06/19/2015  . Prediabetes [R73.03] 06/19/2015  . Obesity, unspecified [E66.9] 06/19/2015  . Absolute anemia [D64.9] 06/19/2015  Past Medical History Past Medical History:  Diagnosis Date  . Anemia   . GERD (gastroesophageal reflux disease)   . History of radiation therapy 03/08/2016 - 04/23/2016   Left Tonsil and Bilateral Neck  . HPV in female   . Squamous cell carcinoma of left tonsil ([place]) 01/27/2016    Past Surgical History Past Surgical History:  Procedure Laterality Date  . CESAREAN SECTION  [id number]  . ESOPHAGOGASTRODUODENOSCOPY N/A 11/07/2015   Dr. Gala Romney: normal esophagus, chronic gastritis   . IR GASTROSTOMY TUBE REMOVAL  10/08/2016  . IR GENERIC HISTORICAL  02/11/2016   IR FLUORO GUIDE PORT INSERTION RIGHT 02/11/2016 Sandi Mariscal, MD [facility]-INTER[facility] RAD  . IR GENERIC HISTORICAL  02/11/2016    IR US GUIDE VASC ACCESS RIGHT 02/11/2016 Sandi Mariscal, MD [facility]  . IR GENERIC HISTORICAL  02/11/2016   IR GASTROSTOMY TUBE MOD SED 02/11/2016 Sandi Mariscal, MD [facility]  . IR REMOVAL TUN ACCESS W/ PORT W/O FL MOD SED  01/12/2017  . MULTIPLE EXTRACTIONS WITH ALVEOLOPLASTY N/A 02/20/2016   Procedure: Extraction of tooth #'s 15,17,18 and 32 with alveoloplasty and dental cleaning of teeth;  Surgeon: Lenn Cal, DDS;  Location: [place];  Service: Oral Surgery;  Laterality: N/A;    Family History Family History  Problem Relation Age of Onset  . Diabetes Mother   . Hypertension Mother   . Prostatitis Father   . ADD / ADHD Daughter   . Cerebral palsy Son      Social History  reports that she has never smoked. She has never used smokeless tobacco. She reports that she does not drink alcohol or use drugs.  Medications  Current Outpatient Medications:  .  ferrous sulfate 325 (65 FE) MG tablet, Take 1 tablet (325 mg total) by mouth daily., Disp: 30 tablet, Rfl: 0 .  levonorgestrel (MIRENA) 20 MCG/24HR IUD, 1 each by Intrauterine route once., Disp: , Rfl:  .  Multiple Vitamins-Minerals (MULTIVITAMIN WITH MINERALS) tablet, Take 1 tablet by mouth daily., Disp: , Rfl:  .  sodium fluoride (FLUORISHIELD) 1.1 % GEL dental gel, Instill one drop of gel per tooth space of fluoride tray. Place over teeth for 5 minutes. Remove. Spit out excess. Repeat nightly., Disp: 120 mL, Rfl: PRN  Allergies No known allergies  Review of Systems Review of Systems - Oncology ROS negative   Physical Exam  Vitals Wt Readings from Last 3 Encounters:  03/10/18 147 lb 1.6 oz (66.7 kg)  01/26/18 144 lb (65.3 kg)  10/20/17 138 lb 8 oz (62.8 kg)   Temp Readings from Last 3 Encounters:  03/10/18 98.2 F (36.8 C) (Oral)  10/20/17 98.8 F (37.1 C)  08/25/17 98.1 F (36.7 C) (Oral)   BP Readings from Last 3 Encounters:  03/10/18 107/63  01/26/18 110/70  10/20/17 121/73   Pulse Readings from Last 3  Encounters:  03/10/18 63  10/20/17 80  08/25/17 77   Constitutional: Well-developed, well-nourished, and in no distress.   HENT: Head: Normocephalic and atraumatic.  Mouth/Throat: No oropharyngeal exudate. Mucosa moist. Eyes: Pupils are equal, round, and reactive to light. Conjunctivae are normal. No scleral icterus.  Neck: Normal range of motion. Neck supple. No JVD present.  Cardiovascular: Normal rate, regular rhythm and normal heart sounds.  Exam reveals no gallop and no friction rub.   No murmur heard. Pulmonary/Chest: Effort normal and breath sounds normal. No respiratory distress. No wheezes.No rales.  Abdominal: Soft. Bowel sounds are normal. No distension. There is no tenderness. There is  no guarding.  Musculoskeletal: No edema or tenderness.  Lymphadenopathy: No cervical, axillary or supraclavicular adenopathy.  Neurological: Alert and oriented to person, place, and time. No cranial nerve deficit.  Skin: Skin is warm and dry. No rash noted. No erythema. No pallor.  Psychiatric: Affect and judgment normal.   Labs No visits with results within 3 Day(s) from this visit.  Latest known visit with results is:  Office Visit on 05/25/2017  Component Date Value Ref Range Status  . Chlamydia trachomatis, NAA 05/25/2017 Negative  Negative Final  . Neisseria gonorrhoeae by PCR 05/25/2017 Negative  Negative Final     Pathology Orders Placed This Encounter  Procedures  . CBC with Differential    Standing Status:   Future    Standing Expiration Date:   03/11/2019  . Comprehensive metabolic panel    Standing Status:   Future    Standing Expiration Date:   03/11/2019  . Lactate dehydrogenase    Standing Status:   Future    Standing Expiration Date:   03/11/2019       Zoila Shutter MD

## 2018-03-10 NOTE — Patient Instructions (Signed)
[facility] at [facility] Discharge Instructions  You were seen by Dr. Higgs today   Thank you for choosing [facility] at [facility] to provide your oncology and hematology care.  To afford each patient quality time with our provider, please arrive at least 15 minutes before your scheduled appointment time.   If you have a lab appointment with the Cancer Center please come in thru the  Main Entrance and check in at the main information desk  You need to re-schedule your appointment should you arrive 10 or more minutes late.  We strive to give you quality time with our providers, and arriving late affects you and other patients whose appointments are after yours.  Also, if you no show three or more times for appointments you may be dismissed from the clinic at the providers discretion.     Again, thank you for choosing [facility].  Our hope is that these requests will decrease the amount of time that you wait before being seen by our physicians.       _____________________________________________________________  Should you have questions after your visit to [facility], please contact our office at [phone] between the hours of 8:00 a.m. and 4:30 p.m.  Voicemails left after 4:00 p.m. will not be returned until the following business day.  For prescription refill requests, have your pharmacy contact our office and allow 72 hours.    Cancer Center Support Programs:   > Cancer Support Group  2nd Tuesday of the month 1pm-2pm, Journey Room   

## 2018-03-23 IMAGING — CT NM PET TUM IMG INITIAL (PI) SKULL BASE T - THIGH
1 of 9 series · 1 of 25 positions shown · non-contrast
Comparison: CT neck from 01/29/2016

CLINICAL DATA: Initial treatment strategy for left tonsillar
squamous cell carcinoma.

EXAM:
NUCLEAR MEDICINE PET SKULL BASE TO THIGH
TECHNIQUE: 11.9 mCi F-18 FDG was injected intravenously. Full-ring PET imaging
was performed from the skull base to thigh after the radiotracer. CT
data was obtained and used for attenuation correction and anatomic
localization.
FASTING BLOOD GLUCOSE:  Value: 93 mg/dl

[Series 3: ct wb 5.0 b30f · axial · 5.0mm · 0.98mm/px · 1 of 290 slices shown]
[im 290/290  brain]
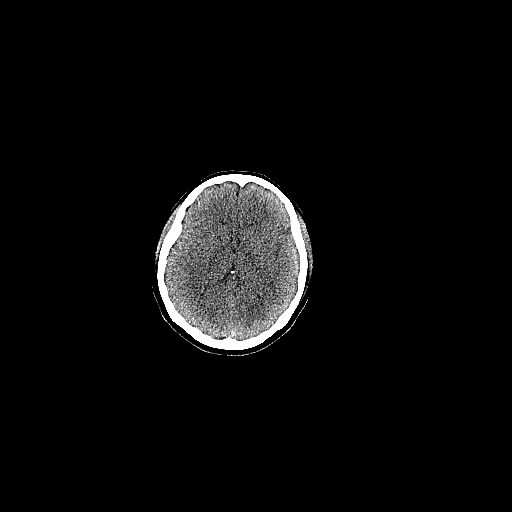

[1 of 25 positions shown; findings below may reference images not displayed]

FINDINGS: NECK

The left palatine tonsillar mass shown on recent CT has a maximum
SUV of 38.5.

A left station IIa lymph node measures 1.1 cm in short axis on image
38/3 but maximum SUV is only 3.5. Contralateral structures in this
vicinity have a maximum SUV of 3.0 and accordingly this node is only
very minimally above the contralateral activity.

CHEST

No hypermetabolic mediastinal or hilar nodes. No suspicious
pulmonary nodules on the CT scan. There is a calcified granuloma in
the superior segment right lower lobe without associated metabolic
activity. Mild cardiomegaly.

ABDOMEN/PELVIS

No abnormal hypermetabolic activity within the liver, pancreas,
adrenal glands, or spleen. No hypermetabolic lymph nodes in the
abdomen or pelvis.

Incidental note is made of an IUD satisfactorily positioned in the
endometrial canal.

SKELETON

No focal hypermetabolic activity to suggest skeletal metastasis.
IMPRESSION: 1. Highly hypermetabolic left palatine tonsillar mass, maximum SUV
38.5.
2. A left station IIa lymph node measure 1.1 cm in short axis,
mildly enlarged on image 38/3, but has only a borderline elevated
SUV at 3.5.
3. Other imaging findings of potential clinical significance: Mild
cardiomegaly. Calcified granuloma in the superior segment right
lower lobe (benign). IUD satisfactorily positioned in the uterus.

## 2018-04-25 ENCOUNTER — Encounter: Payer: Self-pay | Admitting: Hematology and Oncology

## 2018-08-14 ENCOUNTER — Ambulatory Visit: Payer: No Typology Code available for payment source

## 2018-08-18 ENCOUNTER — Other Ambulatory Visit (HOSPITAL_COMMUNITY): Payer: Self-pay

## 2018-08-25 ENCOUNTER — Ambulatory Visit (HOSPITAL_COMMUNITY): Payer: No Typology Code available for payment source | Admitting: Nurse Practitioner

## 2018-09-12 ENCOUNTER — Other Ambulatory Visit (HOSPITAL_COMMUNITY): Payer: Self-pay | Admitting: Emergency Medicine

## 2018-09-12 DIAGNOSIS — J359 Chronic disease of tonsils and adenoids, unspecified: Secondary | ICD-10-CM

## 2018-09-13 ENCOUNTER — Other Ambulatory Visit: Payer: Self-pay

## 2018-09-13 ENCOUNTER — Inpatient Hospital Stay (HOSPITAL_COMMUNITY): Payer: Self-pay | Attending: Hematology

## 2018-09-13 DIAGNOSIS — J359 Chronic disease of tonsils and adenoids, unspecified: Secondary | ICD-10-CM

## 2018-09-13 DIAGNOSIS — Z923 Personal history of irradiation: Secondary | ICD-10-CM | POA: Insufficient documentation

## 2018-09-13 DIAGNOSIS — C090 Malignant neoplasm of tonsillar fossa: Secondary | ICD-10-CM | POA: Insufficient documentation

## 2018-09-13 DIAGNOSIS — K219 Gastro-esophageal reflux disease without esophagitis: Secondary | ICD-10-CM | POA: Insufficient documentation

## 2018-09-13 DIAGNOSIS — Z793 Long term (current) use of hormonal contraceptives: Secondary | ICD-10-CM | POA: Insufficient documentation

## 2018-09-13 DIAGNOSIS — R87820 Cervical low risk human papillomavirus (HPV) DNA test positive: Secondary | ICD-10-CM | POA: Insufficient documentation

## 2018-09-13 DIAGNOSIS — Z79899 Other long term (current) drug therapy: Secondary | ICD-10-CM | POA: Insufficient documentation

## 2018-09-13 LAB — COMPREHENSIVE METABOLIC PANEL
ALT: 16 U/L (ref 0–44)
AST: 21 U/L (ref 15–41)
Albumin: 4.3 g/dL (ref 3.5–5.0)
Alkaline Phosphatase: 59 U/L (ref 38–126)
Anion gap: 8 (ref 5–15)
BUN: 16 mg/dL (ref 6–20)
CO2: 24 mmol/L (ref 22–32)
Calcium: 9 mg/dL (ref 8.9–10.3)
Chloride: 103 mmol/L (ref 98–111)
Creatinine, Ser: 0.87 mg/dL (ref 0.44–1.00)
GFR calc Af Amer: >60 mL/min (ref >60)
GFR calc non Af Amer: >60 mL/min (ref >60)
Glucose, Bld: 104 mg/dL — ABNORMAL HIGH (ref 70–99)
Potassium: 4 mmol/L (ref 3.5–5.1)
Sodium: 135 mmol/L (ref 135–145)
Total Bilirubin: 0.4 mg/dL (ref 0.3–1.2)
Total Protein: 7.4 g/dL (ref 6.5–8.1)

## 2018-09-13 LAB — CBC WITH DIFFERENTIAL/PLATELET
Abs Immature Granulocytes: 0.02 10*3/uL (ref 0.00–0.07)
Basophils Absolute: 0 10*3/uL (ref 0.0–0.1)
Basophils Relative: 1 %
Eosinophils Absolute: 0.1 10*3/uL (ref 0.0–0.5)
Eosinophils Relative: 2 %
HCT: 39.2 % (ref 36.0–46.0)
Hemoglobin: 12.5 g/dL (ref 12.0–15.0)
Immature Granulocytes: 0 %
Lymphocytes Relative: 16 %
Lymphs Abs: 0.7 10*3/uL (ref 0.7–4.0)
MCH: 31.8 pg (ref 26.0–34.0)
MCHC: 31.9 g/dL (ref 30.0–36.0)
MCV: 99.7 fL (ref 80.0–100.0)
Monocytes Absolute: 0.3 10*3/uL (ref 0.1–1.0)
Monocytes Relative: 6 %
Neutro Abs: 3.5 10*3/uL (ref 1.7–7.7)
Neutrophils Relative %: 75 %
Platelets: 216 10*3/uL (ref 150–400)
RBC: 3.93 MIL/uL (ref 3.87–5.11)
RDW: 13.2 % (ref 11.5–15.5)
WBC: 4.7 10*3/uL (ref 4.0–10.5)
nRBC: 0 % (ref 0.0–0.2)

## 2018-09-13 LAB — LACTATE DEHYDROGENASE: LDH: 139 U/L (ref 98–192)

## 2018-09-20 ENCOUNTER — Inpatient Hospital Stay (HOSPITAL_BASED_OUTPATIENT_CLINIC_OR_DEPARTMENT_OTHER): Payer: Self-pay | Admitting: Nurse Practitioner

## 2018-09-20 ENCOUNTER — Other Ambulatory Visit: Payer: Self-pay

## 2018-09-20 ENCOUNTER — Other Ambulatory Visit (HOSPITAL_COMMUNITY): Payer: Self-pay | Admitting: Nurse Practitioner

## 2018-09-20 DIAGNOSIS — C090 Malignant neoplasm of tonsillar fossa: Secondary | ICD-10-CM

## 2018-09-20 NOTE — Progress Notes (Signed)
Heather Strickland, [place] [id number]   CLINIC:  Medical Oncology/Hematology  PCP:  Heather Dryer, PA-C [place] [address] [phone]   REASON FOR VISIT: Follow-up for tonsillar Strickland, HPV positive  CURRENT THERAPY: Observation per NCCN guidelines  BRIEF ONCOLOGIC HISTORY:  Oncology History  Strickland of tonsillar fossa ([facility])  [birth year]1926 Procedure   Gastrostomy tube removed.   01/15/2016 Procedure   Left tonsil biopsy by Dr. Benjamine Strickland   01/15/2016 Initial Diagnosis   She presented to Dr. Benjamine Strickland after being referred by Heather Kaufman, NP (GI) with a 5-6 month history of severe sore throat.  The patient never sought treatment for this issue previously.  Over the last 3 months, patient reported intermittent bleeding from left tonsil.  She also feels a hard mass along the left side of neck.  On physical exam by Dr. Benjamine Strickland, he noted 3+ ulceration and erythema on the left tonsil.  On flexible laryngoscopy, tonsillar hypertrophy with ulceration was noted on the left.   01/19/2016 Pathology Results   Invasive squamous cell carcinoma, poorly differentiated.   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 Fine soft tissue details lost at these levels.  Pharyngeal mass centered in the left palatini tonsil with surround mucosal thickening as described below probably representing neoplasm.  Mucosal thickening of the left aspect of hypopharynx and pharynx with inferior most extension to the margin of the supraglottic airway without appreciable invasion into epiglottis, aryepiglottic folds, vocal cords, or paraglottic fat.  Superior most extension of mucosal thickening to the left aspect of the uvula and along the left aspect of the soft palate to the hard/soft palate junction, this region is obscured by motion  artifact.  Anteriorly there is effacement of left glossopharyngeal sulcus without appreciable tongue base invasion, this region is obscured by motion artifact.  Asymmetric enlargement of left-sided level 2 and 3 cervical lymph nodes possibly metastatic. No evidence for lymph node necrosis or extra nodal extension.   01/29/2016 Imaging   CT chest-  No evidence of metastatic disease in the chest.   02/06/2016 PET scan   1. Highly hypermetabolic left palatine tonsillar mass, maximum SUV 38.5. 2. A left station IIa lymph node measure 1.1 cm in short axis, mildly enlarged on image 38/3, but has only a borderline elevated SUV at 3.5. 3. Other imaging findings of potential clinical significance: Mild cardiomegaly. Calcified granuloma in the superior segment right lower lobe (benign). IUD satisfactorily positioned in the uterus.   02/11/2016 Procedure   Successful fluoroscopic insertion of a 20-French pull-through gastrostomy tube.   02/11/2016 Procedure   Successful placement of a right internal jugular approach power injectable Port-A-Cath. The catheter is ready for immediate use.   03/08/2016 - 04/23/2016 Radiation Therapy   Radiation alone Heather Strickland). Left Tonsil and Bilateral neck / 70 Gy in 35 fractions to gross disease, 63 Gy in 35 fractions to high risk nodal echelons, and 56 Gy in 35 fractions to intermediate risk nodal echelons   10/08/2016 Procedure   Pull-through gastrostomy tube removed.   01/12/2017 Procedure   Port removed by IR      Strickland STAGING: Strickland Staging Strickland of tonsillar fossa ([place]) Staging form: Pharynx - HPV-Mediated Oropharynx, AJCC 8th Edition - Clinical stage from 01/30/2016: Stage I (cT2, cN1, cM0, p16: Positive) - Signed by  Heather Cancer, PA-C on 02/18/2016 - Pathologic: No stage assigned - Unsigned - Clinical: cT3 - Unsigned    INTERVAL HISTORY:  Heather Strickland 43 y.o. female returns for routine follow-up for tonsillar Strickland HPV  positive.  She reports she has been doing well since her last visit.  She denies any problems swallowing.  She is eating whatever food she wants with no problems.  She denies any pain with swallowing or constant.  She reports she has no complaints at this time. Denies any nausea, vomiting, or diarrhea. Denies any new pains. Had not noticed any recent bleeding such as epistaxis, hematuria or hematochezia. Denies recent chest pain on exertion, shortness of breath on minimal exertion, pre-syncopal episodes, or palpitations. Denies any numbness or tingling in hands or feet. Denies any recent fevers, infections, or recent hospitalizations. Patient reports appetite at 100% and energy level at 100%.  She is eating well maintaining her weight at this time.    REVIEW OF SYSTEMS:  Review of Systems  All other systems reviewed and are negative.    PAST MEDICAL/SURGICAL HISTORY:  Past Medical History:  Diagnosis Date  . Anemia   . GERD (gastroesophageal reflux disease)   . History of radiation therapy 03/08/2016 - 04/23/2016   Left Tonsil and Bilateral Neck  . HPV in female   . 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 CESAREAN SECTION  [id number]  . ESOPHAGOGASTRODUODENOSCOPY N/A 11/07/2015   Dr. Gala Strickland: normal esophagus, chronic gastritis   . IR GASTROSTOMY TUBE REMOVAL  10/08/2016  . IR GENERIC HISTORICAL  02/11/2016   IR FLUORO GUIDE PORT INSERTION RIGHT 02/11/2016 Heather Mariscal, MD [facility]  . IR GENERIC HISTORICAL  02/11/2016   IR US GUIDE VASC ACCESS RIGHT 02/11/2016 Heather Mariscal, MD [facility]  . IR GENERIC HISTORICAL  02/11/2016   IR GASTROSTOMY TUBE MOD SED 02/11/2016 Heather Mariscal, MD [facility]  . IR REMOVAL TUN ACCESS W/ PORT W/O FL MOD SED  01/12/2017  . MULTIPLE EXTRACTIONS WITH ALVEOLOPLASTY N/A 02/20/2016   Procedure: Extraction of tooth #'s 15,17,18 and 32 with alveoloplasty and dental cleaning of teeth;  Surgeon:  Heather Strickland, DDS;  Location: [facility];  Service: Oral Surgery;  Laterality: N/A;     SOCIAL HISTORY:  Social History   Socioeconomic History  . Marital status: Married    Spouse name: Not on file  . Number of children: 4  . Years of education: Not on file  . Highest education level: 8th grade  Occupational History  . Not on file  Social Needs  . Financial resource strain: Not on file  . Food insecurity    Worry: Not on file    Inability: Not on file  . Transportation needs    Medical: No    Non-medical: No  Tobacco Use  . Smoking status: Never Smoker  . Smokeless tobacco: Never Used  Substance and Sexual Activity  . Alcohol use: No  . Drug use: No  . Sexual activity: Not Currently    Birth control/protection: I.U.D.  Lifestyle  . Physical activity    Days per week: Not on file    Minutes per session: Not on file  . Stress: Not on file  Relationships  . Social Herbalist on phone: Not on file    Gets together: Not on file    Attends religious service: Not on file    Active member  of club or organization: Not on file    Attends meetings of clubs or organizations: Not on file    Relationship status: Not on file  . Intimate partner violence    Fear of current or ex partner: Not on file    Emotionally abused: Not on file    Physically abused: Not on file    Forced sexual activity: Not on file  Other Topics Concern  . Not on file  Social History Narrative  . Not on file    FAMILY HISTORY:  Family History  Problem Relation Age of Onset  . Diabetes Mother   . Hypertension Mother   . Prostatitis Father   . ADD / ADHD Daughter   . Cerebral palsy Son     CURRENT MEDICATIONS:  Outpatient Encounter Medications as of 09/20/2018  Medication Sig  . ferrous sulfate 325 (65 FE) MG tablet Take 1 tablet (325 mg total) by mouth daily.  Marland Kitchen levonorgestrel (MIRENA) 20 MCG/24HR IUD 1 each by Intrauterine route once.  . Multiple Vitamins-Minerals (MULTIVITAMIN  WITH MINERALS) tablet Take 1 tablet by mouth daily.  . sodium fluoride (FLUORISHIELD) 1.1 % GEL dental gel Instill one drop of gel per tooth space of fluoride tray. Place over teeth for 5 minutes. Remove. Spit out excess. Repeat nightly.   No facility-administered encounter medications on file as of 09/20/2018.     ALLERGIES:  Allergies  Allergen Reactions  . No Known Allergies      PHYSICAL EXAM:  ECOG Performance status: 1  Vital signs: -Deferred due to telephone visit   Physical Exam Deferred due to telephone visit.  Patient was alert and oriented and in no distress.  LABORATORY DATA:  I have reviewed the labs as listed.  CBC    Component Value Date/Time   WBC 4.7 09/13/2018 1607   RBC 3.93 09/13/2018 1607   HGB 12.5 09/13/2018 1607   HGB 11.4 (L) 03/26/2016 1531   HCT 39.2 09/13/2018 1607   HCT 33.4 (L) 03/26/2016 1531   PLT 216 09/13/2018 1607   PLT 213 03/26/2016 1531   MCV 99.7 09/13/2018 1607   MCV 90.8 03/26/2016 1531   MCH 31.8 09/13/2018 1607   MCHC 31.9 09/13/2018 1607   RDW 13.2 09/13/2018 1607   RDW 13.1 03/26/2016 1531   LYMPHSABS 0.7 09/13/2018 1607   LYMPHSABS 0.5 (L) 03/26/2016 1531   MONOABS 0.3 09/13/2018 1607   MONOABS 0.3 03/26/2016 1531   EOSABS 0.1 09/13/2018 1607   EOSABS 0.1 03/26/2016 1531   BASOSABS 0.0 09/13/2018 1607   BASOSABS 0.0 03/26/2016 1531   CMP Latest Ref Rng & Units 09/13/2018 06/17/2016 03/26/2016  Glucose 70 - 99 mg/dL 104(H) 92 89  BUN 6 - 20 mg/dL 16 10 8.3  Creatinine 0.44 - 1.00 mg/dL 0.87 0.43(L) 0.7  Sodium 135 - 145 mmol/L 135 136 136  Potassium 3.5 - 5.1 mmol/L 4.0 3.8 4.0  Chloride 98 - 111 mmol/L 103 103 -  CO2 22 - 32 mmol/L 24 27 27   Calcium 8.9 - 10.3 mg/dL 9.0 9.5 9.4  Total Protein 6.5 - 8.1 g/dL 7.4 7.1 7.3  Total Bilirubin 0.3 - 1.2 mg/dL 0.4 0.8 0.71  Alkaline Phos 38 - 126 U/L 59 52 68  AST 15 - 41 U/L 21 14(L) 13  ALT 0 - 44 U/L 16 9(L) 11     I personally performed a face-to-face visit.  All  questions were answered to patient's stated satisfaction. Encouraged patient to call with any new  concerns or questions before his next visit to the [facility] and we can certain see him sooner, if needed.     ASSESSMENT & PLAN:   Strickland of tonsillar fossa ([facility]) 1. 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 -She was treated with radiation therapy alone by Dr. Isidore Strickland; completed treatment on 04/23/2016. -PET scan on 08/17/2016 showed interval resolution of the left pharyngeal mass.  No significant residual hypermetabolic activity or CT abnormality is observed in this location.  No regional adenopathy or evidence of distant metastatic spread.  5.5 x 4.2 left adnexal cyst is new and photopenic.  Follow-up pelvic sonogram is recommended 10 to 12 weeks. - Pelvic ultrasound done on 02/04/2017 showed bilateral ovaries not visualized, obscured by bowel gas.  Heterogeneous appearance of the endometrium, with poorly defined transitional zone.  OB/GYN consultation and possibly MRI of the pelvis may be considered for further evaluation. -She was seen by GYN and is following up as recommended. -PET scan done on 08/15/2017 showed no evidence of local recurrence in the pharyngeal mucosal space or metastatic disease.  No abnormal metabolic activity.  Interval resolution of the left adnexal cystic lesion. -CT neck and chest done on 03/03/2018 reviewed and showed no evidence of recurrent tumor.  RT changes noted.  No metastatic disease noted in the chest. - Labs done on 09/13/2018 showed potassium 4.0, creatinine 0.87, LFTs WNL, LDH 139, WBC 4.7, hemoglobin 12.5, platelets 216. -She denies any trouble swallowing or eating.  She also denies any pain. -We will follow-up for an office visit in 6 months with repeat labs.      Orders placed this encounter:  Orders Placed This Encounter  Procedures  . Lactate dehydrogenase  . Magnesium  . CBC with Differential/Platelet   . Comprehensive metabolic panel  . Vitamin B12  . VITAMIN D 25 Hydroxy (Vit-D Deficiency, Fractures)  . TSH      Francene Finders, FNP-C [employer] [phone]

## 2018-09-20 NOTE — Patient Instructions (Signed)
[facility] at [facility] Discharge Instructions  Follow up in 6 months with labs    Thank you for choosing [facility] at [facility] to provide your oncology and hematology care.  To afford each patient quality time with our provider, please arrive at least 15 minutes before your scheduled appointment time.   If you have a lab appointment with the Cancer Center please come in thru the Main Entrance and check in at the main information desk.  You need to re-schedule your appointment should you arrive 10 or more minutes late.  We strive to give you quality time with our providers, and arriving late affects you and other patients whose appointments are after yours.  Also, if you no show three or more times for appointments you may be dismissed from the clinic at the providers discretion.     Again, thank you for choosing [facility].  Our hope is that these requests will decrease the amount of time that you wait before being seen by our physicians.       _____________________________________________________________  Should you have questions after your visit to [facility], please contact our office at [phone] between the hours of 8:00 a.m. and 4:30 p.m.  Voicemails left after 4:00 p.m. will not be returned until the following business day.  For prescription refill requests, have your pharmacy contact our office and allow 72 hours.    Due to Covid, you will need to wear a mask upon entering the hospital. If you do not have a mask, a mask will be given to you at the Main Entrance upon arrival. For doctor visits, patients may have 1 support person with them. For treatment visits, patients can not have anyone with them due to social distancing guidelines and our immunocompromised population.      

## 2018-09-20 NOTE — Assessment & Plan Note (Addendum)
1. 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 -She was treated with radiation therapy alone by Dr. Isidore Moos; completed treatment on 04/23/2016. -PET scan on 08/17/2016 showed interval resolution of the left pharyngeal mass.  No significant residual hypermetabolic activity or CT abnormality is observed in this location.  No regional adenopathy or evidence of distant metastatic spread.  5.5 x 4.2 left adnexal cyst is new and photopenic.  Follow-up pelvic sonogram is recommended 10 to 12 weeks. - Pelvic ultrasound done on 02/04/2017 showed bilateral ovaries not visualized, obscured by bowel gas.  Heterogeneous appearance of the endometrium, with poorly defined transitional zone.  OB/GYN consultation and possibly MRI of the pelvis may be considered for further evaluation. -She was seen by GYN and is following up as recommended. -PET scan done on 08/15/2017 showed no evidence of local recurrence in the pharyngeal mucosal space or metastatic disease.  No abnormal metabolic activity.  Interval resolution of the left adnexal cystic lesion. -CT neck and chest done on 03/03/2018 reviewed and showed no evidence of recurrent tumor.  RT changes noted.  No metastatic disease noted in the chest. - Labs done on 09/13/2018 showed potassium 4.0, creatinine 0.87, LFTs WNL, LDH 139, WBC 4.7, hemoglobin 12.5, platelets 216. -She denies any trouble swallowing or eating.  She also denies any pain. -We will follow-up for an office visit in 6 months with repeat labs.

## 2018-10-05 ENCOUNTER — Other Ambulatory Visit: Payer: Self-pay | Admitting: Physician Assistant

## 2018-10-05 DIAGNOSIS — Z131 Encounter for screening for diabetes mellitus: Secondary | ICD-10-CM

## 2018-10-05 DIAGNOSIS — Z13220 Encounter for screening for lipoid disorders: Secondary | ICD-10-CM

## 2018-10-17 ENCOUNTER — Other Ambulatory Visit (HOSPITAL_COMMUNITY)
Admission: RE | Admit: 2018-10-17 | Discharge: 2018-10-17 | Disposition: A | Discharge status: Home / Self Care | Payer: Self-pay | Source: Ambulatory Visit | Attending: Physician Assistant | Admitting: Physician Assistant

## 2018-10-17 ENCOUNTER — Other Ambulatory Visit: Payer: Self-pay

## 2018-10-17 DIAGNOSIS — R69 Illness, unspecified: Secondary | ICD-10-CM | POA: Insufficient documentation

## 2018-10-19 ENCOUNTER — Ambulatory Visit: Payer: Self-pay | Admitting: Physician Assistant

## 2018-11-16 ENCOUNTER — Ambulatory Visit: Payer: Self-pay | Admitting: Physician Assistant

## 2019-03-20 ENCOUNTER — Inpatient Hospital Stay (HOSPITAL_COMMUNITY): Payer: No Typology Code available for payment source

## 2019-03-20 ENCOUNTER — Inpatient Hospital Stay (HOSPITAL_COMMUNITY): Payer: Self-pay | Attending: Hematology | Admitting: Hematology

## 2019-03-20 ENCOUNTER — Other Ambulatory Visit: Payer: Self-pay

## 2019-03-20 VITALS — BP 124/74 | HR 67 | Temp 97.7°F | Resp 18 | Wt 147.0 lb

## 2019-03-20 DIAGNOSIS — C090 Malignant neoplasm of tonsillar fossa: Secondary | ICD-10-CM

## 2019-03-20 DIAGNOSIS — K219 Gastro-esophageal reflux disease without esophagitis: Secondary | ICD-10-CM | POA: Insufficient documentation

## 2019-03-20 DIAGNOSIS — Z793 Long term (current) use of hormonal contraceptives: Secondary | ICD-10-CM | POA: Insufficient documentation

## 2019-03-20 DIAGNOSIS — Z79899 Other long term (current) drug therapy: Secondary | ICD-10-CM | POA: Insufficient documentation

## 2019-03-20 DIAGNOSIS — R1310 Dysphagia, unspecified: Secondary | ICD-10-CM | POA: Insufficient documentation

## 2019-03-20 DIAGNOSIS — Z923 Personal history of irradiation: Secondary | ICD-10-CM | POA: Insufficient documentation

## 2019-03-20 DIAGNOSIS — Z85818 Personal history of malignant neoplasm of other sites of lip, oral cavity, and pharynx: Secondary | ICD-10-CM | POA: Insufficient documentation

## 2019-03-20 DIAGNOSIS — E039 Hypothyroidism, unspecified: Secondary | ICD-10-CM | POA: Insufficient documentation

## 2019-03-20 LAB — CBC WITH DIFFERENTIAL/PLATELET
Abs Immature Granulocytes: 0.01 10*3/uL (ref 0.00–0.07)
Basophils Absolute: 0 10*3/uL (ref 0.0–0.1)
Basophils Relative: 1 %
Eosinophils Absolute: 0.1 10*3/uL (ref 0.0–0.5)
Eosinophils Relative: 2 %
HCT: 38.2 % (ref 36.0–46.0)
Hemoglobin: 12.6 g/dL (ref 12.0–15.0)
Immature Granulocytes: 0 %
Lymphocytes Relative: 19 %
Lymphs Abs: 0.9 10*3/uL (ref 0.7–4.0)
MCH: 32.1 pg (ref 26.0–34.0)
MCHC: 33 g/dL (ref 30.0–36.0)
MCV: 97.2 fL (ref 80.0–100.0)
Monocytes Absolute: 0.3 10*3/uL (ref 0.1–1.0)
Monocytes Relative: 6 %
Neutro Abs: 3.4 10*3/uL (ref 1.7–7.7)
Neutrophils Relative %: 72 %
Platelets: 230 10*3/uL (ref 150–400)
RBC: 3.93 MIL/uL (ref 3.87–5.11)
RDW: 12.9 % (ref 11.5–15.5)
WBC: 4.7 10*3/uL (ref 4.0–10.5)
nRBC: 0 % (ref 0.0–0.2)

## 2019-03-20 LAB — COMPREHENSIVE METABOLIC PANEL
ALT: 14 U/L (ref 0–44)
AST: 19 U/L (ref 15–41)
Albumin: 4.4 g/dL (ref 3.5–5.0)
Alkaline Phosphatase: 51 U/L (ref 38–126)
Anion gap: 10 (ref 5–15)
BUN: 13 mg/dL (ref 6–20)
CO2: 26 mmol/L (ref 22–32)
Calcium: 9.4 mg/dL (ref 8.9–10.3)
Chloride: 100 mmol/L (ref 98–111)
Creatinine, Ser: 0.46 mg/dL (ref 0.44–1.00)
GFR calc Af Amer: >60 mL/min (ref >60)
GFR calc non Af Amer: >60 mL/min (ref >60)
Glucose, Bld: 117 mg/dL — ABNORMAL HIGH (ref 70–99)
Potassium: 3.9 mmol/L (ref 3.5–5.1)
Sodium: 136 mmol/L (ref 135–145)
Total Bilirubin: 0.7 mg/dL (ref 0.3–1.2)
Total Protein: 7.3 g/dL (ref 6.5–8.1)

## 2019-03-20 LAB — TSH: TSH: 7.804 u[IU]/mL — ABNORMAL HIGH (ref 0.350–4.500)

## 2019-03-20 LAB — VITAMIN B12: Vitamin B-12: 439 pg/mL (ref 180–914)

## 2019-03-20 LAB — VITAMIN D 25 HYDROXY (VIT D DEFICIENCY, FRACTURES): Vit D, 25-Hydroxy: 59.07 ng/mL (ref 30–100)

## 2019-03-20 LAB — MAGNESIUM: Magnesium: 2.1 mg/dL (ref 1.7–2.4)

## 2019-03-20 LAB — LACTATE DEHYDROGENASE: LDH: 129 U/L (ref 98–192)

## 2019-03-20 MED ORDER — LEVOTHYROXINE SODIUM 25 MCG PO TABS: 25.0000 ug | ORAL_TABLET | Freq: Every day | ORAL | 1 refills | Status: DC

## 2019-03-20 NOTE — Progress Notes (Signed)
Heather Strickland, Heather Strickland [id number]   CLINIC:  Medical Oncology/Hematology  PCP:  Patient, No Pcp Per No address on file None   REASON FOR VISIT:  Follow-up for left tonsillar Strickland.  CURRENT THERAPY: Surveillance.  BRIEF ONCOLOGIC HISTORY:  Oncology History  Strickland of tonsillar fossa ([place])  [birth year]1926 Procedure   Gastrostomy tube removed.   01/15/2016 Procedure   Left tonsil biopsy by Dr. Benjamine Strickland   01/15/2016 Initial Diagnosis   She presented to Dr. Benjamine Strickland after being referred by Heather Kaufman, NP (GI) with a 5-6 month history of severe sore throat.  The patient never sought treatment for this issue previously.  Over the last 3 months, patient reported intermittent bleeding from left tonsil.  She also feels a hard mass along the left side of neck.  On physical exam by Dr. Benjamine Strickland, he noted 3+ ulceration and erythema on the left tonsil.  On flexible laryngoscopy, tonsillar hypertrophy with ulceration was noted on the left.   01/19/2016 Pathology Results   Invasive squamous cell carcinoma, poorly differentiated.   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 Fine soft tissue details lost at these levels.  Pharyngeal mass centered in the left palatini tonsil with surround mucosal thickening as described below probably representing neoplasm.  Mucosal thickening of the left aspect of hypopharynx and pharynx with inferior most extension to the margin of the supraglottic airway without appreciable invasion into epiglottis, aryepiglottic folds, vocal cords, or paraglottic fat.  Superior most extension of mucosal thickening to the left aspect of the uvula and along the left aspect of the soft palate to the hard/soft palate junction, this region is obscured by motion artifact.  Anteriorly there is effacement of left  glossopharyngeal sulcus without appreciable tongue base invasion, this region is obscured by motion artifact.  Asymmetric enlargement of left-sided level 2 and 3 cervical lymph nodes possibly metastatic. No evidence for lymph node necrosis or extra nodal extension.   01/29/2016 Imaging   CT chest-  No evidence of metastatic disease in the chest.   02/06/2016 PET scan   1. Highly hypermetabolic left palatine tonsillar mass, maximum SUV 38.5. 2. A left station IIa lymph node measure 1.1 cm in short axis, mildly enlarged on image 38/3, but has only a borderline elevated SUV at 3.5. 3. Other imaging findings of potential clinical significance: Mild cardiomegaly. Calcified granuloma in the superior segment right lower lobe (benign). IUD satisfactorily positioned in the uterus.   02/11/2016 Procedure   Successful fluoroscopic insertion of a 20-French pull-through gastrostomy tube.   02/11/2016 Procedure   Successful placement of a right internal jugular approach power injectable Port-A-Cath. The catheter is ready for immediate use.   03/08/2016 - 04/23/2016 Radiation Therapy   Radiation alone Heather Strickland). Left Tonsil and Bilateral neck / 70 Gy in 35 fractions to gross disease, 63 Gy in 35 fractions to high risk nodal echelons, and 56 Gy in 35 fractions to intermediate risk nodal echelons   10/08/2016 Procedure   Pull-through gastrostomy tube removed.   01/12/2017 Procedure   Port removed by IR      Strickland STAGING: Strickland Staging Strickland of tonsillar fossa ([place]) Staging form: Pharynx - HPV-Mediated Oropharynx, AJCC 8th Edition - Clinical stage from 01/30/2016: Stage I (cT2, cN1, cM0, p16: Positive) - Signed by Heather Cancer, PA-C on  02/18/2016 - Pathologic: No stage assigned - Unsigned - Clinical: cT3 - Unsigned    INTERVAL HISTORY:  [name] 44 y.o. female seen for follow-up of squamous cell carcinoma.  He denies any fevers, night sweats or weight loss.  Has occasional  dysphagia to dry foods.  She keeps her mouth moist by drinking lots of water.  Appetite and energy levels are 100%.  No odynophagia reported.  Numbness in the hands and feet has been stable.  No new onset pains.    REVIEW OF SYSTEMS:  Review of Systems  Neurological: Positive for numbness.  All other systems reviewed and are negative.    PAST MEDICAL/SURGICAL HISTORY:  Past Medical History:  Diagnosis Date  . Anemia   . GERD (gastroesophageal reflux disease)   . History of radiation therapy 03/08/2016 - 04/23/2016   Left Tonsil and Bilateral Neck  . HPV in female   . 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 CESAREAN SECTION  [id number]  . ESOPHAGOGASTRODUODENOSCOPY N/A 11/07/2015   Dr. Gala Strickland: normal esophagus, chronic gastritis   . IR GASTROSTOMY TUBE REMOVAL  10/08/2016  . IR GENERIC HISTORICAL  02/11/2016   IR FLUORO GUIDE PORT INSERTION RIGHT 02/11/2016 Heather Mariscal, MD [facility]  . IR GENERIC HISTORICAL  02/11/2016   IR US GUIDE VASC ACCESS RIGHT 02/11/2016 Heather Mariscal, MD [facility]  . IR GENERIC HISTORICAL  02/11/2016   IR GASTROSTOMY TUBE MOD SED 02/11/2016 Heather Mariscal, MD [facility]  . IR REMOVAL TUN ACCESS W/ PORT W/O FL MOD SED  01/12/2017  . MULTIPLE EXTRACTIONS WITH ALVEOLOPLASTY N/A 02/20/2016   Procedure: Extraction of tooth #'s 15,17,18 and 32 with alveoloplasty and dental cleaning of teeth;  Surgeon: Heather Strickland, DDS;  Location: [facility];  Service: Oral Surgery;  Laterality: N/A;     SOCIAL HISTORY:  Social History   Socioeconomic History  . Marital status: Married    Spouse name: Not on file  . Number of children: 4  . Years of education: Not on file  . Highest education level: 8th grade  Occupational History  . Not on file  Tobacco Use  . Smoking status: Never Smoker  . Smokeless tobacco: Never Used  Substance and Sexual Activity  . Alcohol use: No  . Drug use: No  . Sexual  activity: Not Currently    Birth control/protection: I.U.D.  Other Topics Concern  . Not on file  Social History Narrative  . Not on file   Social Determinants of Health   Financial Resource Strain:   . Difficulty of Paying Living Expenses: Not on file  Food Insecurity:   . Worried About Charity fundraiser in the Last Year: Not on file  . Ran Out of Food in the Last Year: Not on file  Transportation Needs:   . Lack of Transportation (Medical): Not on file  . Lack of Transportation (Non-Medical): Not on file  Physical Activity:   . Days of Exercise per Week: Not on file  . Minutes of Exercise per Session: Not on file  Stress:   . Feeling of Stress : Not on file  Social Connections:   . Frequency of Communication with Friends and Family: Not on file  . Frequency of Social Gatherings with Friends and Family: Not on file  . Attends Religious Services: Not on file  . Active Member of Clubs or Organizations: Not on file  . Attends Club  or Organization Meetings: Not on file  . Marital Status: Not on file  Intimate Partner Violence:   . Fear of Current or Ex-Partner: Not on file  . Emotionally Abused: Not on file  . Physically Abused: Not on file  . Sexually Abused: Not on file    FAMILY HISTORY:  Family History  Problem Relation Age of Onset  . Diabetes Mother   . Hypertension Mother   . Prostatitis Father   . ADD / ADHD Daughter   . Cerebral palsy Son     CURRENT MEDICATIONS:  Outpatient Encounter Medications as of 03/20/2019  Medication Sig  . ferrous sulfate 325 (65 FE) MG tablet Take 1 tablet (325 mg total) by mouth daily.  Marland Kitchen levonorgestrel (MIRENA) 20 MCG/24HR IUD 1 each by Intrauterine route once.  . Multiple Vitamins-Minerals (MULTIVITAMIN WITH MINERALS) tablet Take 1 tablet by mouth daily.  . [DISCONTINUED] sodium fluoride (FLUORISHIELD) 1.1 % GEL dental gel Instill one drop of gel per tooth space of fluoride tray. Place over teeth for 5 minutes. Remove. Spit out  excess. Repeat nightly.   No facility-administered encounter medications on file as of 03/20/2019.    ALLERGIES:  Allergies  Allergen Reactions  . No Known Allergies      PHYSICAL EXAM:  ECOG Performance status: 0  Vitals:   03/20/19 1401  BP: 124/74  Pulse: 67  Resp: 18  Temp: 97.7 F (36.5 C)  SpO2: 98%   Filed Weights   03/20/19 1401  Weight: 147 lb (66.7 kg)    Physical Exam Vitals reviewed.  Constitutional:      Appearance: Normal appearance.  HENT:     Head: Normocephalic.     Mouth/Throat:     Mouth: Mucous membranes are moist.     Pharynx: No oropharyngeal exudate.     Comments: No tonsillar lesions seen. Cardiovascular:     Rate and Rhythm: Normal rate and regular rhythm.     Heart sounds: Normal heart sounds.  Pulmonary:     Effort: Pulmonary effort is normal.     Breath sounds: Normal breath sounds.  Abdominal:     General: There is no distension.     Palpations: Abdomen is soft. There is no mass.  Lymphadenopathy:     Cervical: No cervical adenopathy.  Skin:    General: Skin is warm.  Neurological:     General: No focal deficit present.     Mental Status: She is alert and oriented to person, place, and time.  Psychiatric:        Mood and Affect: Mood normal.        Behavior: Behavior normal.      LABORATORY DATA:  I have reviewed the labs as listed.  CBC    Component Value Date/Time   WBC 4.7 03/20/2019 1340   RBC 3.93 03/20/2019 1340   HGB 12.6 03/20/2019 1340   HGB 11.4 (L) 03/26/2016 1531   HCT 38.2 03/20/2019 1340   HCT 33.4 (L) 03/26/2016 1531   PLT 230 03/20/2019 1340   PLT 213 03/26/2016 1531   MCV 97.2 03/20/2019 1340   MCV 90.8 03/26/2016 1531   MCH 32.1 03/20/2019 1340   MCHC 33.0 03/20/2019 1340   RDW 12.9 03/20/2019 1340   RDW 13.1 03/26/2016 1531   LYMPHSABS 0.9 03/20/2019 1340   LYMPHSABS 0.5 (L) 03/26/2016 1531   MONOABS 0.3 03/20/2019 1340   MONOABS 0.3 03/26/2016 1531   EOSABS 0.1 03/20/2019 1340   EOSABS  0.1 03/26/2016 1531  BASOSABS 0.0 03/20/2019 1340   BASOSABS 0.0 03/26/2016 1531   CMP Latest Ref Rng & Units 03/20/2019 09/13/2018 06/17/2016  Glucose 70 - 99 mg/dL 117(H) 104(H) 92  BUN 6 - 20 mg/dL 13 16 10   Creatinine 0.44 - 1.00 mg/dL 0.46 0.87 0.43(L)  Sodium 135 - 145 mmol/L 136 135 136  Potassium 3.5 - 5.1 mmol/L 3.9 4.0 3.8  Chloride 98 - 111 mmol/L 100 103 103  CO2 22 - 32 mmol/L 26 24 27   Calcium 8.9 - 10.3 mg/dL 9.4 9.0 9.5  Total Protein 6.5 - 8.1 g/dL 7.3 7.4 7.1  Total Bilirubin 0.3 - 1.2 mg/dL 0.7 0.4 0.8  Alkaline Phos 38 - 126 U/L 51 59 52  AST 15 - 41 U/L 19 21 14(L)  ALT 0 - 44 U/L 14 16 9(L)       DIAGNOSTIC IMAGING:  I have independently reviewed the scans and discussed with the patient.    ASSESSMENT & PLAN:   Strickland of tonsillar fossa ([place]) 1.  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 -Post completion PET scan on 08/17/2016 showed resolution of the left pharyngeal mass. -Last CT of the neck on 03/03/2018 showed no evidence of recurrence. -She has some dysphagia to dry foods.  Otherwise she drinks plenty of water to keep her mouth moist. -We reviewed her CBC and CMP which are within normal limits.  TSH is pending. -Physical examination today did not show any tonsillar lesions.  No palpable adenopathy in the neck region. -She will come back in 6 months for follow-up with a TSH level.  We will plan to repeat CBC and CMP once a year.  2.  Hypothyroidism: -TSH is 7.8 today. -We will start her on Synthroid 25 mcg daily.  We will follow her up in 2 months.      Orders placed this encounter:  Orders Placed This Encounter  Procedures  . TSH      Derek Jack, [place] [phone]

## 2019-03-20 NOTE — Assessment & Plan Note (Addendum)
1.  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 -Post completion PET scan on 08/17/2016 showed resolution of the left pharyngeal mass. -Last CT of the neck on 03/03/2018 showed no evidence of recurrence. -She has some dysphagia to dry foods.  Otherwise she drinks plenty of water to keep her mouth moist. -We reviewed her CBC and CMP which are within normal limits.  TSH is pending. -Physical examination today did not show any tonsillar lesions.  No palpable adenopathy in the neck region. -She will come back in 6 months for follow-up with a TSH level.  We will plan to repeat CBC and CMP once a year.  2.  Hypothyroidism: -TSH is 7.8 today. -We will start her on Synthroid 25 mcg daily.  We will follow her up in 2 months.

## 2019-03-20 NOTE — Patient Instructions (Signed)
[facility] at [facility] Discharge Instructions  You were seen today by Dr. Katragadda. He went over your recent lab results. He will see you back in 6 months for labs and follow up.   Thank you for choosing [facility] at [facility] to provide your oncology and hematology care.  To afford each patient quality time with our provider, please arrive at least 15 minutes before your scheduled appointment time.   If you have a lab appointment with the Cancer Center please come in thru the  Main Entrance and check in at the main information desk  You need to re-schedule your appointment should you arrive 10 or more minutes late.  We strive to give you quality time with our providers, and arriving late affects you and other patients whose appointments are after yours.  Also, if you no show three or more times for appointments you may be dismissed from the clinic at the providers discretion.     Again, thank you for choosing [facility].  Our hope is that these requests will decrease the amount of time that you wait before being seen by our physicians.       _____________________________________________________________  Should you have questions after your visit to [facility], please contact our office at [phone] between the hours of 8:00 a.m. and 4:30 p.m.  Voicemails left after 4:00 p.m. will not be returned until the following business day.  For prescription refill requests, have your pharmacy contact our office and allow 72 hours.    Cancer Center Support Programs:   > Cancer Support Group  2nd Tuesday of the month 1pm-2pm, Journey Room    

## 2019-04-04 ENCOUNTER — Ambulatory Visit
Admission: EM | Admit: 2019-04-04 | Discharge: 2019-04-04 | Disposition: A | Discharge status: Home / Self Care | Payer: No Typology Code available for payment source | Attending: Emergency Medicine | Admitting: Emergency Medicine

## 2019-04-04 DIAGNOSIS — F411 Generalized anxiety disorder: Secondary | ICD-10-CM

## 2019-04-04 DIAGNOSIS — R030 Elevated blood-pressure reading, without diagnosis of hypertension: Secondary | ICD-10-CM

## 2019-04-04 MED ORDER — HYDROXYZINE HCL 25 MG PO TABS: 25.0000 mg | ORAL_TABLET | Freq: Two times a day (BID) | ORAL | 0 refills | Status: AC | PRN

## 2019-04-04 NOTE — ED Triage Notes (Signed)
Translator used for patient intake.  Pt presents with complaints of high blood pressure, anxiety and insomnia x 3 days.  Pt reports mild headache. Denies chest pain or shortness of breath.

## 2019-04-04 NOTE — Discharge Instructions (Addendum)
  La presin arterial se ve muy bien hoy! Mantenga el control y haga un seguimiento con su PCP segn sea necesario  Descansar y beber lquidos. Consuma una dieta bien equilibrada y evite la ingesta excesiva de cafena. Prescripcin de hidroxicina. Tmelo segn las indicaciones para [employer]. Algunas cosas que puede intentar hacer para ayudar a Public house manager sus sntomas incluyen: llevar un diario, hacer ejercicio, hablar con un amigo o pariente, escuchar msica, salir a caminar o caminar al [employer] u otras actividades que le pueden [place]. Haga un seguimiento con el PCP para Charlie Pitter y un tratamiento adicionales A veces, es posible que necesite medicamentos a largo plazo para ayudar con la ansiedad. Regrese o vaya a la sala de emergencias si tiene sntomas nuevos o que JAARS, como [place], escalofros, fatiga, empeoramiento de la dificultad para respirar, sibilancias, dolor en el pecho, nuseas, vmitos, dolor abdominal, cambios en los hbitos intestinales o de la vejiga, pensamientos de hacerse dao o otros, etc ...  Blood pressure looks great today!  Keep monitoring and follow up with PCP as needed  Rest and drink fluids Eat a well-balanced diet, and avoid excessive caffeine intake Hydroxyzine prescribed.  Take as directed for symptom relief Some things you may try doing to help alleviate your symptoms include: keeping a journal, exercise, talking to a friend or relative, listening to music, going for a walk or hike outside, or other activities that you may find enjoyable Follow up with PCP for further evaluation and management.  Sometimes you may need long-term medication to help with anxiety Return or go to ER if you have any new or worsening symptoms such as fever, chills, fatigue, worsening shortness of breath, wheezing, chest pain, nausea, vomiting, abdominal pain, changes in bowel or bladder habits, thoughts of hurting yourself or others, etc..Marland Kitchen

## 2019-04-04 NOTE — ED Provider Notes (Signed)
[facility]   CA:[id number] 04/04/19 Arrival Time: 1109  CC: Anxiety; HBP  SUBJECTIVE: HPI obtained from stratus interpreter Heather Strickland is a 44 y.o. female who complains of anxiety, SOB, and trouble sleeping x 3 days.  Denies known sick contacts, or COVID exposure.  Husband was recently hospitalized for gastritis.  Describes anxiety as feeling short of breath.  Currently not taking medications.  Symptoms are made worse with trying to sleep.  Reports similar symptoms in the past and diagnosed with panic attack.  Denies HI or SI.  Patient denies fever, chills, rhinorrhea, congestion, cough, nausea, vomiting, chest pain, abdominal pain, changes in bowel or bladder habits.    Also states blood pressure was elevated last night.  "[id number]" was the systolic reading unsure of diastolic reading.  Denies hx of HTN.  BP in office of 113/79.    ROS: As per HPI.  All other pertinent ROS negative.     Past Medical History:  Diagnosis Date  . Anemia   . GERD (gastroesophageal reflux disease)   . History of radiation therapy 03/08/2016 - 04/23/2016   Left Tonsil and Bilateral Neck  . HPV in female   . 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 CESAREAN SECTION  [id number]  . ESOPHAGOGASTRODUODENOSCOPY N/A 11/07/2015   Dr. Gala Romney: normal esophagus, chronic gastritis   . IR GASTROSTOMY TUBE REMOVAL  10/08/2016  . IR GENERIC HISTORICAL  02/11/2016   IR FLUORO GUIDE PORT INSERTION RIGHT 02/11/2016 Sandi Mariscal, MD [facility]  . IR GENERIC HISTORICAL  02/11/2016   IR US GUIDE VASC ACCESS RIGHT 02/11/2016 Sandi Mariscal, MD [facility]  . IR GENERIC HISTORICAL  02/11/2016   IR GASTROSTOMY TUBE MOD SED 02/11/2016 Sandi Mariscal, MD [facility]  . IR REMOVAL TUN ACCESS W/ PORT W/O FL MOD SED  01/12/2017  . MULTIPLE EXTRACTIONS WITH ALVEOLOPLASTY N/A 02/20/2016   Procedure: Extraction of tooth #'s 15,17,18 and 32 with alveoloplasty  and dental cleaning of teeth;  Surgeon: Lenn Cal, DDS;  Location: [facility];  Service: Oral Surgery;  Laterality: N/A;   Allergies  Allergen Reactions  . No Known Allergies    No current facility-administered medications on file prior to encounter.   Current Outpatient Medications on File Prior to Encounter  Medication Sig Dispense Refill  . ferrous sulfate 325 (65 FE) MG tablet Take 1 tablet (325 mg total) by mouth daily. 30 tablet 0  . levonorgestrel (MIRENA) 20 MCG/24HR IUD 1 each by Intrauterine route once.    . Multiple Vitamins-Minerals (MULTIVITAMIN WITH MINERALS) tablet Take 1 tablet by mouth daily.    . [DISCONTINUED] levothyroxine (SYNTHROID) 25 MCG tablet Take 1 tablet (25 mcg total) by mouth daily before breakfast. 30 tablet 1   Social History   Socioeconomic History  . Marital status: Married    Spouse name: Not on file  . Number of children: 4  . Years of education: Not on file  . Highest education level: 8th grade  Occupational History  . Not on file  Tobacco Use  . Smoking status: Never Smoker  . Smokeless tobacco: Never Used  Substance and Sexual Activity  . Alcohol use: No  . Drug use: No  . Sexual activity: Not Currently    Birth control/protection: I.U.D.  Other Topics Concern  . Not on file  Social History Narrative  . Not on file   Social Determinants of Health   Financial Resource  Strain:   . Difficulty of Paying Living Expenses:   Food Insecurity:   . Worried About Charity fundraiser in the Last Year:   . Arboriculturist in the Last Year:   Transportation Needs:   . Film/video editor (Medical):   Marland Kitchen Lack of Transportation (Non-Medical):   Physical Activity:   . Days of Exercise per Week:   . Minutes of Exercise per Session:   Stress:   . Feeling of Stress :   Social Connections:   . Frequency of Communication with Friends and Family:   . Frequency of Social Gatherings with Friends and Family:   . Attends Religious Services:     . Active Member of Clubs or Organizations:   . Attends Archivist Meetings:   Marland Kitchen Marital Status:   Intimate Partner Violence:   . Fear of Current or Ex-Partner:   . Emotionally Abused:   Marland Kitchen Physically Abused:   . Sexually Abused:    Family History  Problem Relation Age of Onset  . Diabetes Mother   . Hypertension Mother   . Prostatitis Father   . ADD / ADHD Daughter   . Cerebral palsy Son     OBJECTIVE:  Vitals:   04/04/19 1128  BP: 113/79  Pulse: 65  Resp: 18  Temp: 98.1 F (36.7 C)  SpO2: 98%    General appearance: alert; no distress Eyes: PERRLA; EOMI HENT: normocephalic; atraumatic; oropharynx clear Neck: supple with FROM Lungs: clear to auscultation bilaterally Heart: regular rate and rhythm.   Abdomen: soft, nondistended, normal active bowel sounds; nontender to palpation; no guarding  Extremities: no edema; symmetrical with no gross deformities Skin: warm and dry Neurologic: ambulates without difficulty Psychological: alert and cooperative; normal mood and affect   ASSESSMENT & PLAN:  1. Generalized anxiety disorder   2. Elevated blood pressure reading     Meds ordered this encounter  Medications  . hydrOXYzine (ATARAX/VISTARIL) 25 MG tablet    Sig: Take 1 tablet (25 mg total) by mouth every 12 (twelve) hours as needed for anxiety.    Dispense:  30 tablet    Refill:  0    Order Specific Question:   Supervising Provider    Answer:   Raylene Everts [id number]    Blood pressure looks great today!  Keep monitoring and follow up with PCP as needed  Rest and drink fluids Eat a well-balanced diet, and avoid excessive caffeine intake Hydroxyzine prescribed.  Take as directed for symptom relief Some things you may try doing to help alleviate your symptoms include: keeping a journal, exercise, talking to a friend or relative, listening to music, going for a walk or hike outside, or other activities that you may find enjoyable Follow up with  PCP for further evaluation and management.  Sometimes you may need long-term medication to help with anxiety Return or go to ER if you have any new or worsening symptoms such as fever, chills, fatigue, worsening shortness of breath, wheezing, chest pain, nausea, vomiting, abdominal pain, changes in bowel or bladder habits, thoughts of hurting yourself or others, etc...  Reviewed expectations re: course of current medical issues. Questions answered. Outlined signs and symptoms indicating need for more acute intervention. Patient verbalized understanding. After Visit Summary given.   Lestine Box, PA-C 04/04/19 1203

## 2019-05-08 ENCOUNTER — Encounter: Payer: Self-pay | Admitting: Physician Assistant

## 2019-05-08 ENCOUNTER — Ambulatory Visit: Payer: No Typology Code available for payment source | Attending: Internal Medicine

## 2019-05-08 ENCOUNTER — Ambulatory Visit: Payer: Self-pay | Admitting: Physician Assistant

## 2019-05-08 ENCOUNTER — Other Ambulatory Visit: Payer: Self-pay

## 2019-05-08 DIAGNOSIS — R0981 Nasal congestion: Secondary | ICD-10-CM

## 2019-05-08 DIAGNOSIS — R05 Cough: Secondary | ICD-10-CM

## 2019-05-08 DIAGNOSIS — Z20822 Contact with and (suspected) exposure to covid-19: Secondary | ICD-10-CM

## 2019-05-08 DIAGNOSIS — R059 Cough, unspecified: Secondary | ICD-10-CM

## 2019-05-08 DIAGNOSIS — Z7689 Persons encountering health services in other specified circumstances: Secondary | ICD-10-CM

## 2019-05-08 DIAGNOSIS — J029 Acute pharyngitis, unspecified: Secondary | ICD-10-CM

## 2019-05-08 NOTE — Progress Notes (Signed)
There were no vitals taken for this visit.   Subjective:    Patient ID: Heather Strickland, female    DOB: 11-07-1975, 44 y.o.   MRN: [id number]  HPI: Heather Strickland is a 44 y.o. female presenting on 05/08/2019 for No chief complaint on file.   HPI    Pt was scheduled for in-office appointment today but she was changed to a Tablet / virtual appointment due to she is sick.  I connected with  Locklyn Lorenzo-Rendon on 05/08/19 by a video enabled telemedicine application and verified that I am speaking with the correct person using two identifiers.   I discussed the limitations of evaluation and management by telemedicine. The patient expressed understanding and agreed to proceed.  Pt is in her parked car in office parking lot.  Translator and provider are in office.     Pt is a Returning pt.  She was last seen here October 2019  She had Tonsil CA.   Pt wasn't aware that she had follow up appointment with oncologist next month  She says her Her throat is dry. No trobule  Swallowing  She has been Sick x 3 week.  She wasn't around anyone who was sick that she knows of.  No travel out of state.  She hasn't gotten tested for covid.  No covid vaccination.    Sick- she is having congestion and dry throat.  Also cough.  No fever.    She was SOB but last time was about 4 days ago.  Also HA  She is self - Treat with hot tea, nasal spray and claritin.  -her Pap good until 2023 -her screening mammogram is due    Relevant past medical, surgical, family and social history reviewed and updated as indicated. Interim medical history since our last visit reviewed. Allergies and medications reviewed and updated.   Current Outpatient Medications:  .  ferrous sulfate 325 (65 FE) MG tablet, Take 1 tablet (325 mg total) by mouth daily., Disp: 30 tablet, Rfl: 0 .  hydrOXYzine (ATARAX/VISTARIL) 25 MG tablet, Take 1 tablet (25 mg total) by mouth every 12 (twelve) hours as needed for  anxiety., Disp: 30 tablet, Rfl: 0 .  levonorgestrel (MIRENA) 20 MCG/24HR IUD, 1 each by Intrauterine route once., Disp: , Rfl:  .  loratadine (CLARITIN) 10 MG tablet, Take 10 mg by mouth daily., Disp: , Rfl:  .  Multiple Vitamins-Minerals (MULTIVITAMIN WITH MINERALS) tablet, Take 1 tablet by mouth daily., Disp: , Rfl:      Review of Systems  Per HPI unless specifically indicated above     Objective:    There were no vitals taken for this visit.  Wt Readings from Last 3 Encounters:  03/20/19 147 lb (66.7 kg)  03/10/18 147 lb 1.6 oz (66.7 kg)  01/26/18 144 lb (65.3 kg)    Physical Exam Vitals reviewed.  Constitutional:      General: She is not in acute distress.    Appearance: She is well-developed. She is not toxic-appearing.  HENT:     Head: Normocephalic and atraumatic.  Pulmonary:     Effort: Pulmonary effort is normal.  Neurological:     Mental Status: She is alert and oriented to person, place, and time.     Gait: Gait normal.  Psychiatric:        Attention and Perception: Attention normal.        Speech: Speech normal.        Behavior: Behavior normal. Behavior is cooperative.  Assessment & Plan:   Encounter Diagnoses  Name Primary?  . Encounter to establish care Yes  . Suspected COVID-19 virus infection   . Head congestion   . Sore throat   . Cough      -pt scheduled for covid test -pt needs screening mammogram -will Discuss mirena removal and replacement at next appointment -recent labs in Epic reviewed.  No addidtional labs at this time.  She will need to get a1c and lipid panel as she never got those when she was a patient previously.  -will have follow up in office 1 month since unable to do so today.  She is to contact office sooner prn

## 2019-05-09 LAB — SARS-COV-2, NAA 2 DAY TAT

## 2019-05-09 LAB — NOVEL CORONAVIRUS, NAA: SARS-CoV-2, NAA: NOT DETECTED

## 2019-05-10 ENCOUNTER — Telehealth: Payer: Self-pay | Admitting: Student

## 2019-05-10 NOTE — Telephone Encounter (Signed)
LPN called and notified pt that COVID test was negative. Pt verbalized understanding.

## 2019-05-16 ENCOUNTER — Ambulatory Visit: Payer: Self-pay | Admitting: Physician Assistant

## 2019-05-23 ENCOUNTER — Other Ambulatory Visit: Payer: Self-pay | Admitting: Physician Assistant

## 2019-05-23 DIAGNOSIS — Z13220 Encounter for screening for lipoid disorders: Secondary | ICD-10-CM

## 2019-05-23 DIAGNOSIS — Z131 Encounter for screening for diabetes mellitus: Secondary | ICD-10-CM

## 2019-05-30 ENCOUNTER — Ambulatory Visit (HOSPITAL_COMMUNITY): Payer: No Typology Code available for payment source | Admitting: Nurse Practitioner

## 2019-06-04 ENCOUNTER — Ambulatory Visit: Payer: Self-pay | Admitting: Physician Assistant

## 2019-06-04 ENCOUNTER — Other Ambulatory Visit: Payer: Self-pay

## 2019-06-04 ENCOUNTER — Other Ambulatory Visit (HOSPITAL_COMMUNITY)
Admission: RE | Admit: 2019-06-04 | Discharge: 2019-06-04 | Disposition: A | Discharge status: Home / Self Care | Payer: No Typology Code available for payment source | Source: Ambulatory Visit | Attending: Physician Assistant | Admitting: Physician Assistant

## 2019-06-04 DIAGNOSIS — Z13220 Encounter for screening for lipoid disorders: Secondary | ICD-10-CM

## 2019-06-04 DIAGNOSIS — Z131 Encounter for screening for diabetes mellitus: Secondary | ICD-10-CM

## 2019-06-04 LAB — LIPID PANEL
Cholesterol: 205 mg/dL — ABNORMAL HIGH (ref 0–200)
HDL: 67 mg/dL (ref >40)
LDL Cholesterol: 125 mg/dL — ABNORMAL HIGH (ref 0–99)
Total CHOL/HDL Ratio: 3.1 RATIO
Triglycerides: 65 mg/dL (ref <150)
VLDL: 13 mg/dL (ref 0–40)

## 2019-06-04 LAB — HEMOGLOBIN A1C
Hgb A1c MFr Bld: 5.9 % — ABNORMAL HIGH (ref 4.8–5.6)
Mean Plasma Glucose: 122.63 mg/dL

## 2019-06-05 ENCOUNTER — Encounter (HOSPITAL_COMMUNITY): Payer: Self-pay | Admitting: Nurse Practitioner

## 2019-06-05 ENCOUNTER — Inpatient Hospital Stay (HOSPITAL_COMMUNITY): Payer: Self-pay | Attending: Hematology | Admitting: Nurse Practitioner

## 2019-06-05 ENCOUNTER — Other Ambulatory Visit: Payer: Self-pay

## 2019-06-05 ENCOUNTER — Other Ambulatory Visit (HOSPITAL_COMMUNITY): Payer: Self-pay | Admitting: Nurse Practitioner

## 2019-06-05 VITALS — BP 104/72 | HR 60 | Temp 97.7°F | Resp 18 | Wt 145.6 lb

## 2019-06-05 DIAGNOSIS — Z79899 Other long term (current) drug therapy: Secondary | ICD-10-CM | POA: Insufficient documentation

## 2019-06-05 DIAGNOSIS — Z923 Personal history of irradiation: Secondary | ICD-10-CM | POA: Insufficient documentation

## 2019-06-05 DIAGNOSIS — Z793 Long term (current) use of hormonal contraceptives: Secondary | ICD-10-CM | POA: Insufficient documentation

## 2019-06-05 DIAGNOSIS — F430 Acute stress reaction: Secondary | ICD-10-CM | POA: Insufficient documentation

## 2019-06-05 DIAGNOSIS — G478 Other sleep disorders: Secondary | ICD-10-CM | POA: Insufficient documentation

## 2019-06-05 DIAGNOSIS — R110 Nausea: Secondary | ICD-10-CM | POA: Insufficient documentation

## 2019-06-05 DIAGNOSIS — R1310 Dysphagia, unspecified: Secondary | ICD-10-CM | POA: Insufficient documentation

## 2019-06-05 DIAGNOSIS — E039 Hypothyroidism, unspecified: Secondary | ICD-10-CM

## 2019-06-05 DIAGNOSIS — K219 Gastro-esophageal reflux disease without esophagitis: Secondary | ICD-10-CM | POA: Insufficient documentation

## 2019-06-05 DIAGNOSIS — F419 Anxiety disorder, unspecified: Secondary | ICD-10-CM | POA: Insufficient documentation

## 2019-06-05 DIAGNOSIS — R42 Dizziness and giddiness: Secondary | ICD-10-CM | POA: Insufficient documentation

## 2019-06-05 DIAGNOSIS — C090 Malignant neoplasm of tonsillar fossa: Secondary | ICD-10-CM

## 2019-06-05 DIAGNOSIS — Z85818 Personal history of malignant neoplasm of other sites of lip, oral cavity, and pharynx: Secondary | ICD-10-CM | POA: Insufficient documentation

## 2019-06-05 MED ORDER — LEVOTHYROXINE SODIUM 25 MCG PO TABS: 25.0000 ug | ORAL_TABLET | Freq: Every day | ORAL | 5 refills | Status: DC

## 2019-06-05 NOTE — Patient Instructions (Signed)
[facility] at [facility] Discharge Instructions     Thank you for choosing [facility] at [facility] to provide your oncology and hematology care.  To afford each patient quality time with our provider, please arrive at least 15 minutes before your scheduled appointment time.   If you have a lab appointment with the Cancer Center please come in thru the Main Entrance and check in at the main information desk.  You need to re-schedule your appointment should you arrive 10 or more minutes late.  We strive to give you quality time with our providers, and arriving late affects you and other patients whose appointments are after yours.  Also, if you no show three or more times for appointments you may be dismissed from the clinic at the providers discretion.     Again, thank you for choosing [facility].  Our hope is that these requests will decrease the amount of time that you wait before being seen by our physicians.       _____________________________________________________________  Should you have questions after your visit to [facility], please contact our office at [phone] between the hours of 8:00 a.m. and 4:30 p.m.  Voicemails left after 4:00 p.m. will not be returned until the following business day.  For prescription refill requests, have your pharmacy contact our office and allow 72 hours.    Due to Covid, you will need to wear a mask upon entering the hospital. If you do not have a mask, a mask will be given to you at the Main Entrance upon arrival. For doctor visits, patients may have 1 support person with them. For treatment visits, patients can not have anyone with them due to social distancing guidelines and our immunocompromised population.      

## 2019-06-05 NOTE — Assessment & Plan Note (Signed)
1. 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 -She was treated with radiation therapy alone by Dr. Isidore Moos; completed treatment on 04/23/2016. -PET scan on 08/17/2016 showed interval resolution of the left pharyngeal mass.  No significant residual hypermetabolic activity or CT abnormality is observed in this location.  No regional adenopathy or evidence of distant metastatic spread.  5.5 x 4.2 left adnexal cyst is new and photopenic.  Follow-up pelvic sonogram is recommended 10 to 12 weeks. - Pelvic ultrasound done on 02/04/2017 showed bilateral ovaries not visualized, obscured by bowel gas.  Heterogeneous appearance of the endometrium, with poorly defined transitional zone.  OB/GYN consultation and possibly MRI of the pelvis may be considered for further evaluation. -She was seen by GYN and is following up as recommended. -PET scan done on 08/15/2017 showed no evidence of local recurrence in the pharyngeal mucosal space or metastatic disease.  No abnormal metabolic activity.  Interval resolution of the left adnexal cystic lesion. -CT neck and chest done on 03/03/2018 reviewed and showed no evidence of recurrent tumor.  RT changes noted.  No metastatic disease noted in the chest. - Labs done on 03/20/2019 showed TSH 7.804 -She was placed on Synthroid 25 mg however she did not take the medication because she was unsure of what it was for. -I was seeing her back today for TSH recheck however she did not have labs drawn.  Synthroid was explained to patient and she will start taking the medication.  We will check her back in 2 months with a TSH recheck. -She reports she is having more problems swallowing.  She also reports she has more thick sputum. -We will follow-up for an office visit in 2 months with repeat TSH check. -She will follow up with Dr. Delton Coombes in September with repeat labs and scans.  2.  Hypothyroidism: -03/20/2019 showed her TSH 7.8. -Thyroid  25 mcg daily.

## 2019-06-05 NOTE — Progress Notes (Signed)
[place] [place], [place] [address]   CLINIC:  Medical Oncology/Hematology  PCP:  Soyla Dryer, PA-C [place] [address] [phone]   REASON FOR VISIT: Follow-up for tonsillar cancer   CURRENT THERAPY: Observation  BRIEF ONCOLOGIC HISTORY:  Oncology History  Cancer of tonsillar fossa ([place])  09/1926 Procedure   Gastrostomy tube removed.   01/15/2016 Procedure   Left tonsil biopsy by Dr. Benjamine Mola   01/15/2016 Initial Diagnosis   She presented to Dr. Benjamine Mola after being referred by [name], NP (GI) with a 5-6 month history of severe sore throat.  The patient never sought treatment for this issue previously.  Over the last 3 months, patient reported intermittent bleeding from left tonsil.  She also feels a hard mass along the left side of neck.  On physical exam by Dr. Benjamine Mola, he noted 3+ ulceration and erythema on the left tonsil.  On flexible laryngoscopy, tonsillar hypertrophy with ulceration was noted on the left.   01/19/2016 Pathology Results   Invasive squamous cell carcinoma, poorly differentiated.   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 Fine soft tissue details lost at these levels.  Pharyngeal mass centered in the left palatini tonsil with surround mucosal thickening as described below probably representing neoplasm.  Mucosal thickening of the left aspect of hypopharynx and pharynx with inferior most extension to the margin of the supraglottic airway without appreciable invasion into epiglottis, aryepiglottic folds, vocal cords, or paraglottic fat.  Superior most extension of mucosal thickening to the left aspect of the uvula and along the left aspect of the soft palate to the hard/soft palate junction, this region is obscured by motion artifact.  Anteriorly there is  effacement of left glossopharyngeal sulcus without appreciable tongue base invasion, this region is obscured by motion artifact.  Asymmetric enlargement of left-sided level 2 and 3 cervical lymph nodes possibly metastatic. No evidence for lymph node necrosis or extra nodal extension.   01/29/2016 Imaging   CT chest-  No evidence of metastatic disease in the chest.   02/06/2016 PET scan   1. Highly hypermetabolic left palatine tonsillar mass, maximum SUV 38.5. 2. A left station IIa lymph node measure 1.1 cm in short axis, mildly enlarged on image 38/3, but has only a borderline elevated SUV at 3.5. 3. Other imaging findings of potential clinical significance: Mild cardiomegaly. Calcified granuloma in the superior segment right lower lobe (benign). IUD satisfactorily positioned in the uterus.   02/11/2016 Procedure   Successful fluoroscopic insertion of a 20-French pull-through gastrostomy tube.   02/11/2016 Procedure   Successful placement of a right internal jugular approach power injectable Port-A-Cath. The catheter is ready for immediate use.   03/08/2016 - 04/23/2016 Radiation Therapy   Radiation alone Isidore Moos). Left Tonsil and Bilateral neck / 70 Gy in 35 fractions to gross disease, 63 Gy in 35 fractions to high risk nodal echelons, and 56 Gy in 35 fractions to intermediate risk nodal echelons   10/08/2016 Procedure   Pull-through gastrostomy tube removed.   01/12/2017 Procedure   Port removed by IR     CANCER STAGING: Cancer Staging Cancer of tonsillar fossa ([place]) Staging form: Pharynx - HPV-Mediated Oropharynx, AJCC 8th Edition - Clinical stage from 01/30/2016: Stage I (cT2, cN1, cM0, p16: Positive) - Signed by Baird Cancer, PA-C on  02/18/2016 - Pathologic: No stage assigned - Unsigned - Clinical: cT3 - Unsigned    INTERVAL HISTORY:  [name] 44 y.o. female returns for routine visit for tonsillar cancer.  Patient reports she is having increased trouble  swallowing.  She also reports more thick mucus.  She is having extreme anxiety about her cancer returning.  She is asking for scans to see if it is gone. Denies any nausea, vomiting, or diarrhea. Denies any new pains. Had not noticed any recent bleeding such as epistaxis, hematuria or hematochezia. Denies recent chest pain on exertion, shortness of breath on minimal exertion, pre-syncopal episodes, or palpitations. Denies any numbness or tingling in hands or feet. Denies any recent fevers, infections, or recent hospitalizations. Patient reports appetite at 75% and energy level at 50%.  She is eating well maintain her weight at this time.     REVIEW OF SYSTEMS:  Review of Systems  HENT:   Positive for trouble swallowing.   Respiratory: Positive for cough and shortness of breath.   Gastrointestinal: Positive for nausea.  Neurological: Positive for dizziness and numbness.  Psychiatric/Behavioral: Positive for sleep disturbance. The patient is nervous/anxious.   All other systems reviewed and are negative.    PAST MEDICAL/SURGICAL HISTORY:  Past Medical History:  Diagnosis Date  . Anemia   . GERD (gastroesophageal reflux disease)   . History of radiation therapy 03/08/2016 - 04/23/2016   Left Tonsil and Bilateral Neck  . HPV in female   . Squamous cell carcinoma of left tonsil ([place]) 01/27/2016   Past Surgical History:  Procedure Laterality Date  . CESAREAN SECTION  [id number]  . ESOPHAGOGASTRODUODENOSCOPY N/A 11/07/2015   Dr. Gala Romney: normal esophagus, chronic gastritis   . IR GASTROSTOMY TUBE REMOVAL  10/08/2016  . IR GENERIC HISTORICAL  02/11/2016   IR FLUORO GUIDE PORT INSERTION RIGHT 02/11/2016 Sandi Mariscal, MD [facility]  . IR GENERIC HISTORICAL  02/11/2016   IR US GUIDE VASC ACCESS RIGHT 02/11/2016 Sandi Mariscal, MD [facility]  . IR GENERIC HISTORICAL  02/11/2016   IR GASTROSTOMY TUBE MOD SED 02/11/2016 Sandi Mariscal, MD [facility]  . IR REMOVAL TUN ACCESS W/ PORT W/O FL MOD SED   01/12/2017  . MULTIPLE EXTRACTIONS WITH ALVEOLOPLASTY N/A 02/20/2016   Procedure: Extraction of tooth #'s 15,17,18 and 32 with alveoloplasty and dental cleaning of teeth;  Surgeon: Lenn Cal, DDS;  Location: [facility];  Service: Oral Surgery;  Laterality: N/A;     SOCIAL HISTORY:  Social History   Socioeconomic History  . Marital status: Married    Spouse name: Not on file  . Number of children: 4  . Years of education: Not on file  . Highest education level: 8th grade  Occupational History  . Not on file  Tobacco Use  . Smoking status: Never Smoker  . Smokeless tobacco: Never Used  Substance and Sexual Activity  . Alcohol use: No  . Drug use: No  . Sexual activity: Not Currently    Birth control/protection: I.U.D.  Other Topics Concern  . Not on file  Social History Narrative  . Not on file   Social Determinants of Health   Financial Resource Strain:   . Difficulty of Paying Living Expenses:   Food Insecurity:   . Worried About Charity fundraiser in the Last Year:   . Arboriculturist in the Last Year:   Transportation Needs:   . Film/video editor (Medical):   Marland Kitchen Lack of Transportation (Non-Medical):  Physical Activity:   . Days of Exercise per Week:   . Minutes of Exercise per Session:   Stress:   . Feeling of Stress :   Social Connections:   . Frequency of Communication with Friends and Family:   . Frequency of Social Gatherings with Friends and Family:   . Attends Religious Services:   . Active Member of Clubs or Organizations:   . Attends Archivist Meetings:   Marland Kitchen Marital Status:   Intimate Partner Violence:   . Fear of Current or Ex-Partner:   . Emotionally Abused:   Marland Kitchen Physically Abused:   . Sexually Abused:     FAMILY HISTORY:  Family History  Problem Relation Age of Onset  . Diabetes Mother   . Hypertension Mother   . Prostatitis Father   . ADD / ADHD Daughter   . Cerebral palsy Son     CURRENT MEDICATIONS:  Outpatient  Encounter Medications as of 06/05/2019  Medication Sig  . ferrous sulfate 325 (65 FE) MG tablet Take 1 tablet (325 mg total) by mouth daily.  . hydrOXYzine (ATARAX/VISTARIL) 25 MG tablet Take 1 tablet (25 mg total) by mouth every 12 (twelve) hours as needed for anxiety.  Marland Kitchen levonorgestrel (MIRENA) 20 MCG/24HR IUD 1 each by Intrauterine route once.  . loratadine (CLARITIN) 10 MG tablet Take 10 mg by mouth daily.  . Multiple Vitamins-Minerals (MULTIVITAMIN WITH MINERALS) tablet Take 1 tablet by mouth daily.  Marland Kitchen levothyroxine (SYNTHROID) 25 MCG tablet Take 1 tablet (25 mcg total) by mouth daily before breakfast.  . [DISCONTINUED] levothyroxine (SYNTHROID) 25 MCG tablet Take 1 tablet (25 mcg total) by mouth daily before breakfast.   No facility-administered encounter medications on file as of 06/05/2019.    ALLERGIES:  Allergies  Allergen Reactions  . No Known Allergies      PHYSICAL EXAM:  ECOG Performance status: 1  Vitals:   06/05/19 1114  BP: 104/72  Pulse: 60  Resp: 18  Temp: 97.7 F (36.5 C)  SpO2: 99%   Filed Weights   06/05/19 1114  Weight: 145 lb 9.6 oz (66 kg)   Physical Exam Constitutional:      Appearance: Normal appearance. She is normal weight.  Cardiovascular:     Rate and Rhythm: Normal rate and regular rhythm.     Heart sounds: Normal heart sounds.  Pulmonary:     Effort: Pulmonary effort is normal.     Breath sounds: Normal breath sounds.  Abdominal:     General: Bowel sounds are normal.     Palpations: Abdomen is soft.  Musculoskeletal:        General: Normal range of motion.  Skin:    General: Skin is warm.  Neurological:     Mental Status: She is alert and oriented to person, place, and time. Mental status is at baseline.  Psychiatric:        Mood and Affect: Mood normal.        Behavior: Behavior normal.        Thought Content: Thought content normal.        Judgment: Judgment normal.      LABORATORY DATA:  I have reviewed the labs as  listed.  CBC    Component Value Date/Time   WBC 4.7 03/20/2019 1340   RBC 3.93 03/20/2019 1340   HGB 12.6 03/20/2019 1340   HGB 11.4 (L) 03/26/2016 1531   HCT 38.2 03/20/2019 1340   HCT 33.4 (L) 03/26/2016 1531   PLT 230  03/20/2019 1340   PLT 213 03/26/2016 1531   MCV 97.2 03/20/2019 1340   MCV 90.8 03/26/2016 1531   MCH 32.1 03/20/2019 1340   MCHC 33.0 03/20/2019 1340   RDW 12.9 03/20/2019 1340   RDW 13.1 03/26/2016 1531   LYMPHSABS 0.9 03/20/2019 1340   LYMPHSABS 0.5 (L) 03/26/2016 1531   MONOABS 0.3 03/20/2019 1340   MONOABS 0.3 03/26/2016 1531   EOSABS 0.1 03/20/2019 1340   EOSABS 0.1 03/26/2016 1531   BASOSABS 0.0 03/20/2019 1340   BASOSABS 0.0 03/26/2016 1531   CMP Latest Ref Rng & Units 03/20/2019 09/13/2018 06/17/2016  Glucose 70 - 99 mg/dL 117(H) 104(H) 92  BUN 6 - 20 mg/dL 13 16 10   Creatinine 0.44 - 1.00 mg/dL 0.46 0.87 0.43(L)  Sodium 135 - 145 mmol/L 136 135 136  Potassium 3.5 - 5.1 mmol/L 3.9 4.0 3.8  Chloride 98 - 111 mmol/L 100 103 103  CO2 22 - 32 mmol/L 26 24 27   Calcium 8.9 - 10.3 mg/dL 9.4 9.0 9.5  Total Protein 6.5 - 8.1 g/dL 7.3 7.4 7.1  Total Bilirubin 0.3 - 1.2 mg/dL 0.7 0.4 0.8  Alkaline Phos 38 - 126 U/L 51 59 52  AST 15 - 41 U/L 19 21 14(L)  ALT 0 - 44 U/L 14 16 9(L)    All questions were answered to patient's stated satisfaction. Encouraged patient to call with any new concerns or questions before his next visit to the cancer center and we can certain see him sooner, if needed.      ASSESSMENT & PLAN:  Cancer of tonsillar fossa ([facility]) 1. 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 -She was treated with radiation therapy alone by Dr. Isidore Moos; completed treatment on 04/23/2016. -PET scan on 08/17/2016 showed interval resolution of the left pharyngeal mass.  No significant residual hypermetabolic activity or CT abnormality is observed in this location.  No regional adenopathy or evidence of distant  metastatic spread.  5.5 x 4.2 left adnexal cyst is new and photopenic.  Follow-up pelvic sonogram is recommended 10 to 12 weeks. - Pelvic ultrasound done on 02/04/2017 showed bilateral ovaries not visualized, obscured by bowel gas.  Heterogeneous appearance of the endometrium, with poorly defined transitional zone.  OB/GYN consultation and possibly MRI of the pelvis may be considered for further evaluation. -She was seen by GYN and is following up as recommended. -PET scan done on 08/15/2017 showed no evidence of local recurrence in the pharyngeal mucosal space or metastatic disease.  No abnormal metabolic activity.  Interval resolution of the left adnexal cystic lesion. -CT neck and chest done on 03/03/2018 reviewed and showed no evidence of recurrent tumor.  RT changes noted.  No metastatic disease noted in the chest. - Labs done on 03/20/2019 showed TSH 7.804 -She was placed on Synthroid 25 mg however she did not take the medication because she was unsure of what it was for. -I was seeing her back today for TSH recheck however she did not have labs drawn.  Synthroid was explained to patient and she will start taking the medication.  We will check her back in 2 months with a TSH recheck. -She reports she is having more problems swallowing.  She also reports she has more thick sputum. -We will follow-up for an office visit in 2 months with repeat TSH check. -She will follow up with Dr. Delton Coombes in September with repeat labs and scans.  2.  Hypothyroidism: -03/20/2019 showed  her TSH 7.8. -Thyroid 25 mcg daily.     Orders placed this encounter:  Orders Placed This Encounter  Procedures  . CT SOFT TISSUE NECK W CONTRAST  . TSH  . CBC with Differential/Platelet  . Comprehensive metabolic panel  . Lactate dehydrogenase      Francene Finders, FNP-C [place] [phone]

## 2019-06-12 ENCOUNTER — Other Ambulatory Visit: Payer: Self-pay

## 2019-06-12 ENCOUNTER — Ambulatory Visit: Payer: Self-pay | Admitting: Physician Assistant

## 2019-06-12 ENCOUNTER — Encounter: Payer: Self-pay | Admitting: Physician Assistant

## 2019-06-12 VITALS — BP 100/70 | HR 78 | Temp 97.5°F | Ht 62.0 in | Wt 141.8 lb

## 2019-06-12 DIAGNOSIS — Z789 Other specified health status: Secondary | ICD-10-CM

## 2019-06-12 DIAGNOSIS — E039 Hypothyroidism, unspecified: Secondary | ICD-10-CM

## 2019-06-12 DIAGNOSIS — Z975 Presence of (intrauterine) contraceptive device: Secondary | ICD-10-CM

## 2019-06-12 DIAGNOSIS — C090 Malignant neoplasm of tonsillar fossa: Secondary | ICD-10-CM

## 2019-06-12 DIAGNOSIS — Z1239 Encounter for other screening for malignant neoplasm of breast: Secondary | ICD-10-CM

## 2019-06-12 NOTE — Progress Notes (Signed)
BP 100/70   Pulse 78   Temp (!) 97.5 F (36.4 C)   SpO2 98%    Subjective:    Patient ID: Heather Strickland, female    DOB: [birth year], 44 y.o.   MRN: [id number]  HPI: Heather Strickland is a 44 y.o. female presenting on 06/12/2019 for Follow-up   HPI  Pt had a negative covid 19 screening questionnaire.    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  She has hypoThyroidism.  Her PAP is UTD.  Her mammogram is due  She has had mirena 5 years.  She says it is due to be removed.    Relevant past medical, surgical, family and social history reviewed and updated as indicated. Interim medical history since our last visit reviewed. Allergies and medications reviewed and updated.    Current Outpatient Medications:  .  ferrous sulfate 325 (65 FE) MG tablet, Take 1 tablet (325 mg total) by mouth daily., Disp: 30 tablet, Rfl: 0 .  hydrOXYzine (ATARAX/VISTARIL) 25 MG tablet, Take 1 tablet (25 mg total) by mouth every 12 (twelve) hours as needed for anxiety., Disp: 30 tablet, Rfl: 0 .  levonorgestrel (MIRENA) 20 MCG/24HR IUD, 1 each by Intrauterine route once., Disp: , Rfl:  .  levothyroxine (SYNTHROID) 25 MCG tablet, Take 1 tablet (25 mcg total) by mouth daily before breakfast., Disp: 30 tablet, Rfl: 5 .  loratadine (CLARITIN) 10 MG tablet, Take 10 mg by mouth daily., Disp: , Rfl:  .  Multiple Vitamins-Minerals (MULTIVITAMIN WITH MINERALS) tablet, Take 1 tablet by mouth daily., Disp: , Rfl:     Review of Systems  Per HPI unless specifically indicated above     Objective:    BP 100/70   Pulse 78   Temp (!) 97.5 F (36.4 C)   SpO2 98%   Wt Readings from Last 3 Encounters:  06/05/19 145 lb 9.6 oz (66 kg)  03/20/19 147 lb (66.7 kg)  03/10/18 147 lb 1.6 oz (66.7 kg)    Physical Exam Vitals reviewed.  Constitutional:      General: She is not in acute distress.    Appearance: She is well-developed.  HENT:     Head: Normocephalic and atraumatic.   Cardiovascular:     Rate and Rhythm: Normal rate and regular rhythm.  Pulmonary:     Effort: Pulmonary effort is normal.     Breath sounds: Normal breath sounds.  Abdominal:     General: Bowel sounds are normal.     Palpations: Abdomen is soft. There is no mass.     Tenderness: There is no abdominal tenderness.  Musculoskeletal:     Cervical back: Neck supple.     Right lower leg: No edema.     Left lower leg: No edema.  Lymphadenopathy:     Cervical: No cervical adenopathy.  Skin:    General: Skin is warm and dry.  Neurological:     Mental Status: She is alert and oriented to person, place, and time.  Psychiatric:        Attention and Perception: Attention normal.        Speech: Speech normal.        Behavior: Behavior normal. Behavior is cooperative.     Results for orders placed or performed during the hospital encounter of 06/04/19  Lipid panel  Result Value Ref Range   Cholesterol 205 (H) 0 - 200 mg/dL   Triglycerides 65 <150 mg/dL   HDL 67 >40 mg/dL  Total CHOL/HDL Ratio 3.1 RATIO   VLDL 13 0 - 40 mg/dL   LDL Cholesterol 125 (H) 0 - 99 mg/dL  Hemoglobin A1c  Result Value Ref Range   Hgb A1c MFr Bld 5.9 (H) 4.8 - 5.6 %   Mean Plasma Glucose 122.63 mg/dL      Assessment & Plan:   Encounter Diagnoses  Name Primary?  . Hypothyroidism, unspecified type Yes  . Cancer of tonsillar fossa ([place])   . Encounter for screening for malignant neoplasm of breast, unspecified screening modality   . Presence of IUD   . Not proficient in [place] language      -refer for screening mammogram --pt to Continue with oncology per their recommendations -Refer to gyn for iud removal -pt has cafa / [employer] financial assistance application- she has app she needs to submit it -pt counseled on lowfat diet and exercise for lipids -counseled on prediabetes and encouraged pt to avoid excessive sugar intake.  -F/u 6 months.  She is to contact office sooner prn

## 2019-06-12 NOTE — Patient Instructions (Signed)
Colesterol elevado High Cholesterol  El colesterol elevado es una afeccin en la que la sangre tiene niveles altos de una sustancia blanca, cerosa y parecida a la grasa (colesterol). El organismo humano necesita una pequea cantidad de colesterol. El hgado fabrica todo el colesterol que el organismo necesita. El exceso de colesterol proviene de los alimentos que comemos. La sangre transporta el colesterol desde el hgado a travs de los vasos sanguneos. Si tiene el colesterol elevado, este puede depositarse (formar placas) en las paredes de los vasos sanguneos (arterias). Las placas provocan el estrechamiento y la rigidez de las arterias. Las placas de colesterol aumentan el riesgo de sufrir un infarto de miocardio y un accidente cerebrovascular. Trabaje con el mdico para mantener las concentraciones de colesterol en un rango saludable. Qu incrementa el riesgo? Es ms probable que esta afeccin se manifieste en las personas que:  Consumen alimentos con alto contenido de grasa animal (grasa saturada) o colesterol.  Tienen sobrepeso.  No hacen suficiente ejercicio fsico.  Tienen antecedentes familiares de colesterol elevado. Cules son los signos o los sntomas? Esta afeccin no presenta sntomas. Cmo se diagnostica? Esta afeccin podra diagnosticarse a partir de los resultados de anlisis de sangre.  Si es mayor de 20aos, es posible que el mdico le controle el colesterol cada 4a6aos.  Los controles pueden ser ms frecuentes si ya tuvo el colesterol elevado u otros factores de riesgo de enfermedades cardacas. En el anlisis de sangre de colesterol, se determina lo siguiente:  El colesterol "malo" (colesterol LDL). Este es el principal tipo de colesterol que causa enfermedades cardacas. El nivel recomendado de LDL es de menos de100.  El colesterol "bueno" (colesterol HDL). Este tipo ayuda a proteger contra las enfermedades cardacas limpiando las arterias y arrastrando el  LDL. El nivel recomendado de HDL es de60 o superior.  Triglicridos. Estos son grasas que el organismo puede almacenar o quemar como fuente de energa. El nivel recomendado de triglicridos es de menos de 150.  Colesterol total. Esta es una medicin de la cantidad total de colesterol en la sangre, que incluye el colesterol LDL, el colesterol HDL y los triglicridos. El valor saludable es de menos de200. Cmo se trata? Esta afeccin se trata con cambios en la dieta y en el estilo de vida, y con medicamentos. Cambios en la dieta  Entre ellos, la ingesta de una mayor cantidad de cereales integrales, frutas, verduras, frutos secos y pescado.  Tambin podran incluir la reduccin del consumo de carnes rojas y alimentos con mucho azcar agregado. Cambios en el estilo de vida  Entre ellos, realizar sesiones de ejercicios aerbicos durante, por lo menos, 40minutos, 3veces por semana. Por ejemplo, caminar, andar en bicicleta y nadar. Los ejercicios aerbicos junto con una dieta sana pueden ayudar a que se mantenga en un peso saludable.  Los cambios tambin podran incluir dejar de fumar. Medicamentos  Por lo general, se administran medicamentos si con los cambios en la alimentacin y en el estilo de vida no se logra reducir el colesterol hasta niveles saludables.  El mdico podra recetarle estatinas. Se ha demostrado que las estatinas disminuyen los niveles de colesterol, lo que puede reducir el riesgo de padecer una enfermedad cardaca. Siga estas indicaciones en su casa: Comida y bebida Si se lo indic el mdico:  Coma pollo (sin piel), pescado, ternera, mariscos, pechuga de pavo molida y cortes de carne roja de pulpa o de lomo.  No coma alimentos fritos ni carnes grasosas, como salchichas y salame.  Coma muchas   frutas, como manzanas.  Coma gran cantidad de verduras, como brcoli, papas y zanahorias.  Coma porotos, guisantes secos y lentejas.  Coma cereales, como cebada, arroz,  cuscs y trigo burgol.  Coma pastas sin salsas con crema.  Tome leche descremada o semidescremada, y coma yogures y quesos descremados o semidescremados.  No coma ni beba leche entera, crema, helado, yemas de huevo ni quesos duros.  No coma margarinas en barra ni untables que contengan grasas trans (que tambin se conocen como aceites parcialmente hidrogenados).  No coma aceites tropicales saturados, como el de coco y el de palma.  No coma tortas, galletas, galletitas ni otros productos horneados que contengan grasas trans.  Instrucciones generales  Haga ejercicio segn las indicaciones del mdico. Aumente la cantidad de ejercicio fsico que realiza mediante actividades como jardinera, salir a caminar o usar las escaleras.  Tome los medicamentos de venta libre y los recetados solamente como se lo haya indicado el mdico.  No consuma ningn producto que contenga nicotina o tabaco, como cigarrillos y cigarrillos electrnicos. Si necesita ayuda para dejar de fumar, consulte al mdico.  Concurra a todas las visitas de control como se lo haya indicado el mdico. Esto es importante. Comunquese con un mdico si:  Tiene dificultad para seguir una dieta sana o mantener un peso saludable.  Necesita ayuda para comenzar un programa de ejercicios.  Necesita ayuda para dejar de fumar. Solicite ayuda de inmediato si:  Siente dolor en el pecho.  Tiene dificultad para respirar. Esta informacin no tiene como fin reemplazar el consejo del mdico. Asegrese de hacerle al mdico cualquier pregunta que tenga. Document Revised: 04/05/2016 Document Reviewed: 06/28/2015 Elsevier Patient Education  2020 [employer]  

## 2019-06-13 ENCOUNTER — Other Ambulatory Visit (HOSPITAL_COMMUNITY): Payer: Self-pay | Admitting: Physician Assistant

## 2019-06-13 ENCOUNTER — Other Ambulatory Visit: Payer: Self-pay | Admitting: Student

## 2019-06-13 DIAGNOSIS — Z1231 Encounter for screening mammogram for malignant neoplasm of breast: Secondary | ICD-10-CM

## 2019-06-28 ENCOUNTER — Other Ambulatory Visit: Payer: Self-pay

## 2019-06-28 ENCOUNTER — Ambulatory Visit (HOSPITAL_COMMUNITY)
Admission: RE | Admit: 2019-06-28 | Discharge: 2019-06-28 | Disposition: A | Discharge status: Home / Self Care | Payer: Self-pay | Source: Ambulatory Visit | Attending: Physician Assistant | Admitting: Physician Assistant

## 2019-06-28 DIAGNOSIS — Z1231 Encounter for screening mammogram for malignant neoplasm of breast: Secondary | ICD-10-CM | POA: Insufficient documentation

## 2019-08-06 ENCOUNTER — Inpatient Hospital Stay (HOSPITAL_COMMUNITY): Payer: Self-pay | Attending: Hematology

## 2019-08-07 ENCOUNTER — Ambulatory Visit (HOSPITAL_COMMUNITY): Payer: No Typology Code available for payment source | Admitting: Nurse Practitioner

## 2019-08-14 ENCOUNTER — Inpatient Hospital Stay (HOSPITAL_COMMUNITY): Payer: Self-pay | Attending: Nurse Practitioner | Admitting: Nurse Practitioner

## 2019-08-14 ENCOUNTER — Inpatient Hospital Stay (HOSPITAL_COMMUNITY): Payer: Self-pay

## 2019-08-14 ENCOUNTER — Other Ambulatory Visit: Payer: Self-pay

## 2019-08-14 DIAGNOSIS — Z79899 Other long term (current) drug therapy: Secondary | ICD-10-CM | POA: Insufficient documentation

## 2019-08-14 DIAGNOSIS — C090 Malignant neoplasm of tonsillar fossa: Secondary | ICD-10-CM

## 2019-08-14 DIAGNOSIS — Z923 Personal history of irradiation: Secondary | ICD-10-CM | POA: Insufficient documentation

## 2019-08-14 DIAGNOSIS — E039 Hypothyroidism, unspecified: Secondary | ICD-10-CM

## 2019-08-14 DIAGNOSIS — Z85818 Personal history of malignant neoplasm of other sites of lip, oral cavity, and pharynx: Secondary | ICD-10-CM | POA: Insufficient documentation

## 2019-08-14 DIAGNOSIS — F418 Other specified anxiety disorders: Secondary | ICD-10-CM | POA: Insufficient documentation

## 2019-08-14 DIAGNOSIS — K219 Gastro-esophageal reflux disease without esophagitis: Secondary | ICD-10-CM | POA: Insufficient documentation

## 2019-08-14 LAB — CBC WITH DIFFERENTIAL/PLATELET
Abs Immature Granulocytes: 0.02 10*3/uL (ref 0.00–0.07)
Basophils Absolute: 0.1 10*3/uL (ref 0.0–0.1)
Basophils Relative: 1 %
Eosinophils Absolute: 0.2 10*3/uL (ref 0.0–0.5)
Eosinophils Relative: 4 %
HCT: 36.8 % (ref 36.0–46.0)
Hemoglobin: 12 g/dL (ref 12.0–15.0)
Immature Granulocytes: 1 %
Lymphocytes Relative: 23 %
Lymphs Abs: 0.9 10*3/uL (ref 0.7–4.0)
MCH: 31.5 pg (ref 26.0–34.0)
MCHC: 32.6 g/dL (ref 30.0–36.0)
MCV: 96.6 fL (ref 80.0–100.0)
Monocytes Absolute: 0.3 10*3/uL (ref 0.1–1.0)
Monocytes Relative: 7 %
Neutro Abs: 2.5 10*3/uL (ref 1.7–7.7)
Neutrophils Relative %: 64 %
Platelets: 215 10*3/uL (ref 150–400)
RBC: 3.81 MIL/uL — ABNORMAL LOW (ref 3.87–5.11)
RDW: 13 % (ref 11.5–15.5)
WBC: 3.9 10*3/uL — ABNORMAL LOW (ref 4.0–10.5)
nRBC: 0 % (ref 0.0–0.2)

## 2019-08-14 LAB — TSH: TSH: 6.932 u[IU]/mL — ABNORMAL HIGH (ref 0.350–4.500)

## 2019-08-14 LAB — COMPREHENSIVE METABOLIC PANEL
ALT: 15 U/L (ref 0–44)
AST: 23 U/L (ref 15–41)
Albumin: 4.1 g/dL (ref 3.5–5.0)
Alkaline Phosphatase: 53 U/L (ref 38–126)
Anion gap: 7 (ref 5–15)
BUN: 13 mg/dL (ref 6–20)
CO2: 23 mmol/L (ref 22–32)
Calcium: 8.7 mg/dL — ABNORMAL LOW (ref 8.9–10.3)
Chloride: 107 mmol/L (ref 98–111)
Creatinine, Ser: 0.62 mg/dL (ref 0.44–1.00)
GFR calc Af Amer: >60 mL/min (ref >60)
GFR calc non Af Amer: >60 mL/min (ref >60)
Glucose, Bld: 108 mg/dL — ABNORMAL HIGH (ref 70–99)
Potassium: 3.7 mmol/L (ref 3.5–5.1)
Sodium: 137 mmol/L (ref 135–145)
Total Bilirubin: 0.6 mg/dL (ref 0.3–1.2)
Total Protein: 6.9 g/dL (ref 6.5–8.1)

## 2019-08-14 LAB — LACTATE DEHYDROGENASE: LDH: 144 U/L (ref 98–192)

## 2019-08-14 NOTE — Assessment & Plan Note (Addendum)
1. 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 -She was treated with radiation therapy alone by Dr. Isidore Moos; completed treatment on 04/23/2016. -PET scan on 08/17/2016 showed interval resolution of the left pharyngeal mass.  No significant residual hypermetabolic activity or CT abnormality is observed in this location.  No regional adenopathy or evidence of distant metastatic spread.  5.5 x 4.2 left adnexal cyst is new and photopenic.  Follow-up pelvic sonogram is recommended 10 to 12 weeks. - Pelvic ultrasound done on 02/04/2017 showed bilateral ovaries not visualized, obscured by bowel gas.  Heterogeneous appearance of the endometrium, with poorly defined transitional zone.  OB/GYN consultation and possibly MRI of the pelvis may be considered for further evaluation. -She was seen by GYN and is following up as recommended. -PET scan done on 08/15/2017 showed no evidence of local recurrence in the pharyngeal mucosal space or metastatic disease.  No abnormal metabolic activity.  Interval resolution of the left adnexal cystic lesion. -CT neck and chest done on 03/03/2018 reviewed and showed no evidence of recurrent tumor.  RT changes noted.  No metastatic disease noted in the chest. - Labs done on 03/20/2019 showed TSH 7.804 -She was placed on Synthroid 25 mg however she did not take the medication because she was unsure of what it was for. -I was seeing her back today for TSH recheck however she did not have labs drawn.  Synthroid was explained to patient and she will start taking the medication.  We will check her back in 2 months with a TSH recheck. -Labs done on 08/14/2019 showed TSH has come down to 6.932.  She is now taking her Synthroid 25 mg daily. -She reports her swallowing has improved. -She will follow up with Dr. Delton Coombes in September with repeat labs and scans.  2.  Hypothyroidism: -03/20/2019 showed her TSH 7.8. -Labs done on 08/14/2019 shows TSH  6.9 -Thyroid 25 mcg daily.

## 2019-08-14 NOTE — Progress Notes (Signed)
[name], [name]   CLINIC:  Medical Oncology/Hematology  PCP:  Soyla Dryer, PA-C [place] [address] [phone]   REASON FOR VISIT: Follow-up for cancer of the tonsillar fossa   CURRENT THERAPY: Observation  BRIEF ONCOLOGIC HISTORY:  Oncology History  Cancer of tonsillar fossa ([place])  09/1926 Procedure   Gastrostomy tube removed.   01/15/2016 Procedure   Left tonsil biopsy by Dr. Benjamine Mola   01/15/2016 Initial Diagnosis   She presented to Dr. Benjamine Mola after being referred by [name], NP (GI) with a 5-6 month history of severe sore throat.  The patient never sought treatment for this issue previously.  Over the last 3 months, patient reported intermittent bleeding from left tonsil.  She also feels a hard mass along the left side of neck.  On physical exam by Dr. Benjamine Mola, he noted 3+ ulceration and erythema on the left tonsil.  On flexible laryngoscopy, tonsillar hypertrophy with ulceration was noted on the left.   01/19/2016 Pathology Results   Invasive squamous cell carcinoma, poorly differentiated.   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 Fine soft tissue details lost at these levels.  Pharyngeal mass centered in the left palatini tonsil with surround mucosal thickening as described below probably representing neoplasm.  Mucosal thickening of the left aspect of hypopharynx and pharynx with inferior most extension to the margin of the supraglottic airway without appreciable invasion into epiglottis, aryepiglottic folds, vocal cords, or paraglottic fat.  Superior most extension of mucosal thickening to the left aspect of the uvula and along the left aspect of the soft palate to the hard/soft palate junction, this region is obscured by motion artifact.  Anteriorly  there is effacement of left glossopharyngeal sulcus without appreciable tongue base invasion, this region is obscured by motion artifact.  Asymmetric enlargement of left-sided level 2 and 3 cervical lymph nodes possibly metastatic. No evidence for lymph node necrosis or extra nodal extension.   01/29/2016 Imaging   CT chest-  No evidence of metastatic disease in the chest.   02/06/2016 PET scan   1. Highly hypermetabolic left palatine tonsillar mass, maximum SUV 38.5. 2. A left station IIa lymph node measure 1.1 cm in short axis, mildly enlarged on image 38/3, but has only a borderline elevated SUV at 3.5. 3. Other imaging findings of potential clinical significance: Mild cardiomegaly. Calcified granuloma in the superior segment right lower lobe (benign). IUD satisfactorily positioned in the uterus.   02/11/2016 Procedure   Successful fluoroscopic insertion of a 20-French pull-through gastrostomy tube.   02/11/2016 Procedure   Successful placement of a right internal jugular approach power injectable Port-A-Cath. The catheter is ready for immediate use.   03/08/2016 - 04/23/2016 Radiation Therapy   Radiation alone Isidore Moos). Left Tonsil and Bilateral neck / 70 Gy in 35 fractions to gross disease, 63 Gy in 35 fractions to high risk nodal echelons, and 56 Gy in 35 fractions to intermediate risk nodal echelons   10/08/2016 Procedure   Pull-through gastrostomy tube removed.   01/12/2017 Procedure   Port removed by IR     CANCER STAGING: Cancer Staging Cancer of tonsillar fossa ([place] [place]) Staging form: Pharynx - HPV-Mediated Oropharynx, AJCC 8th Edition - Clinical stage from 01/30/2016: Stage I (cT2, cN1, cM0, p16: Positive) - Signed by Robynn Pane  S, PA-C on 02/18/2016 - Pathologic: No stage assigned - Unsigned - Clinical: cT3 - Unsigned    INTERVAL HISTORY:  Ms. Ogando 44 y.o. female returns for routine follow-up for cancer of the tonsillar fossa.  Patient reports her  swallowing has improved over the past few months.  She denies any pain at this time. Denies any nausea, vomiting, or diarrhea. Denies any new pains. Had not noticed any recent bleeding such as epistaxis, hematuria or hematochezia. Denies recent chest pain on exertion, shortness of breath on minimal exertion, pre-syncopal episodes, or palpitations. Denies any numbness or tingling in hands or feet. Denies any recent fevers, infections, or recent hospitalizations. Patient reports appetite at 75% and energy level at 75%.  She is eating well maintain her weight at this time.    REVIEW OF SYSTEMS:  Review of Systems  HENT:   Positive for trouble swallowing (has improved from last visit).   Psychiatric/Behavioral: Positive for depression. The patient is nervous/anxious.   All other systems reviewed and are negative.    PAST MEDICAL/SURGICAL HISTORY:  Past Medical History:  Diagnosis Date  . Anemia   . GERD (gastroesophageal reflux disease)   . History of radiation therapy 03/08/2016 - 04/23/2016   Left Tonsil and Bilateral Neck  . HPV in female   . 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 CESAREAN SECTION  [id number]  . ESOPHAGOGASTRODUODENOSCOPY N/A 11/07/2015   Dr. Gala Romney: normal esophagus, chronic gastritis   . IR GASTROSTOMY TUBE REMOVAL  10/08/2016  . IR GENERIC HISTORICAL  02/11/2016   IR FLUORO GUIDE PORT INSERTION RIGHT 02/11/2016 Sandi Mariscal, MD [facility]  . IR GENERIC HISTORICAL  02/11/2016   IR US GUIDE VASC ACCESS RIGHT 02/11/2016 Sandi Mariscal, MD [facility]  . IR GENERIC HISTORICAL  02/11/2016   IR GASTROSTOMY TUBE MOD SED 02/11/2016 Sandi Mariscal, MD [facility]  . IR REMOVAL TUN ACCESS W/ PORT W/O FL MOD SED  01/12/2017  . MULTIPLE EXTRACTIONS WITH ALVEOLOPLASTY N/A 02/20/2016   Procedure: Extraction of tooth #'s 15,17,18 and 32 with alveoloplasty and dental cleaning of teeth;  Surgeon: Lenn Cal, DDS;   Location: [facility];  Service: Oral Surgery;  Laterality: N/A;     SOCIAL HISTORY:  Social History   Socioeconomic History  . Marital status: Married    Spouse name: Not on file  . Number of children: 4  . Years of education: Not on file  . Highest education level: 8th grade  Occupational History  . Not on file  Tobacco Use  . Smoking status: Never Smoker  . Smokeless tobacco: Never Used  Vaping Use  . Vaping Use: Never used  Substance and Sexual Activity  . Alcohol use: No  . Drug use: No  . Sexual activity: Not Currently    Birth control/protection: I.U.D.  Other Topics Concern  . Not on file  Social History Narrative  . Not on file   Social Determinants of Health   Financial Resource Strain:   . Difficulty of Paying Living Expenses:   Food Insecurity:   . Worried About Charity fundraiser in the Last Year:   . Arboriculturist in the Last Year:   Transportation Needs:   . Film/video editor (Medical):   Marland Kitchen Lack of Transportation (Non-Medical):   Physical Activity:   . Days of Exercise per Week:   . Minutes of Exercise per Session:   Stress:   .  Feeling of Stress :   Social Connections:   . Frequency of Communication with Friends and Family:   . Frequency of Social Gatherings with Friends and Family:   . Attends Religious Services:   . Active Member of Clubs or Organizations:   . Attends Archivist Meetings:   Marland Kitchen Marital Status:   Intimate Partner Violence:   . Fear of Current or Ex-Partner:   . Emotionally Abused:   Marland Kitchen Physically Abused:   . Sexually Abused:     FAMILY HISTORY:  Family History  Problem Relation Age of Onset  . Diabetes Mother   . Hypertension Mother   . Prostatitis Father   . ADD / ADHD Daughter   . Cerebral palsy Son     CURRENT MEDICATIONS:  Outpatient Encounter Medications as of 08/14/2019  Medication Sig  . ferrous sulfate 325 (65 FE) MG tablet Take 1 tablet (325 mg total) by mouth daily.  . hydrOXYzine  (ATARAX/VISTARIL) 25 MG tablet Take 1 tablet (25 mg total) by mouth every 12 (twelve) hours as needed for anxiety.  Marland Kitchen levonorgestrel (MIRENA) 20 MCG/24HR IUD 1 each by Intrauterine route once.  Marland Kitchen levothyroxine (SYNTHROID) 25 MCG tablet Take 1 tablet (25 mcg total) by mouth daily before breakfast.  . loratadine (CLARITIN) 10 MG tablet Take 10 mg by mouth daily.  . Multiple Vitamins-Minerals (MULTIVITAMIN WITH MINERALS) tablet Take 1 tablet by mouth daily.   No facility-administered encounter medications on file as of 08/14/2019.    ALLERGIES:  Allergies  Allergen Reactions  . No Known Allergies      PHYSICAL EXAM:  ECOG Performance status: 1  Vitals:   08/14/19 0827  BP: 129/72  Pulse: 64  Resp: 18  Temp: (!) 97.3 F (36.3 C)  SpO2: 99%   Filed Weights   08/14/19 0827  Weight: 140 lb 11.2 oz (63.8 kg)   Physical Exam Constitutional:      Appearance: Normal appearance. She is normal weight.  Cardiovascular:     Rate and Rhythm: Normal rate and regular rhythm.     Heart sounds: Normal heart sounds.  Pulmonary:     Effort: Pulmonary effort is normal.     Breath sounds: Normal breath sounds.  Abdominal:     General: Bowel sounds are normal.     Palpations: Abdomen is soft.  Musculoskeletal:        General: Normal range of motion.  Skin:    General: Skin is warm.  Neurological:     Mental Status: She is alert and oriented to person, place, and time. Mental status is at baseline.  Psychiatric:        Mood and Affect: Mood normal.        Behavior: Behavior normal.        Thought Content: Thought content normal.        Judgment: Judgment normal.      LABORATORY DATA:  I have reviewed the labs as listed.  CBC    Component Value Date/Time   WBC 3.9 (L) 08/14/2019 0807   RBC 3.81 (L) 08/14/2019 0807   HGB 12.0 08/14/2019 0807   HGB 11.4 (L) 03/26/2016 1531   HCT 36.8 08/14/2019 0807   HCT 33.4 (L) 03/26/2016 1531   PLT 215 08/14/2019 0807   PLT 213 03/26/2016  1531   MCV 96.6 08/14/2019 0807   MCV 90.8 03/26/2016 1531   MCH 31.5 08/14/2019 0807   MCHC 32.6 08/14/2019 0807   RDW 13.0 08/14/2019 0807  RDW 13.1 03/26/2016 1531   LYMPHSABS 0.9 08/14/2019 0807   LYMPHSABS 0.5 (L) 03/26/2016 1531   MONOABS 0.3 08/14/2019 0807   MONOABS 0.3 03/26/2016 1531   EOSABS 0.2 08/14/2019 0807   EOSABS 0.1 03/26/2016 1531   BASOSABS 0.1 08/14/2019 0807   BASOSABS 0.0 03/26/2016 1531   CMP Latest Ref Rng & Units 08/14/2019 03/20/2019 09/13/2018  Glucose 70 - 99 mg/dL 108(H) 117(H) 104(H)  BUN 6 - 20 mg/dL 13 13 16   Creatinine 0.44 - 1.00 mg/dL 0.62 0.46 0.87  Sodium 135 - 145 mmol/L 137 136 135  Potassium 3.5 - 5.1 mmol/L 3.7 3.9 4.0  Chloride 98 - 111 mmol/L 107 100 103  CO2 22 - 32 mmol/L 23 26 24   Calcium 8.9 - 10.3 mg/dL 8.7(L) 9.4 9.0  Total Protein 6.5 - 8.1 g/dL 6.9 7.3 7.4  Total Bilirubin 0.3 - 1.2 mg/dL 0.6 0.7 0.4  Alkaline Phos 38 - 126 U/L 53 51 59  AST 15 - 41 U/L 23 19 21   ALT 0 - 44 U/L 15 14 16     All questions were answered to patient's stated satisfaction. Encouraged patient to call with any new concerns or questions before his next visit to the cancer center and we can certain see him sooner, if needed.     ASSESSMENT & PLAN:  Cancer of tonsillar fossa ([facility]) 1. 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 -She was treated with radiation therapy alone by Dr. Isidore Moos; completed treatment on 04/23/2016. -PET scan on 08/17/2016 showed interval resolution of the left pharyngeal mass.  No significant residual hypermetabolic activity or CT abnormality is observed in this location.  No regional adenopathy or evidence of distant metastatic spread.  5.5 x 4.2 left adnexal cyst is new and photopenic.  Follow-up pelvic sonogram is recommended 10 to 12 weeks. - Pelvic ultrasound done on 02/04/2017 showed bilateral ovaries not visualized, obscured by bowel gas.  Heterogeneous appearance of the  endometrium, with poorly defined transitional zone.  OB/GYN consultation and possibly MRI of the pelvis may be considered for further evaluation. -She was seen by GYN and is following up as recommended. -PET scan done on 08/15/2017 showed no evidence of local recurrence in the pharyngeal mucosal space or metastatic disease.  No abnormal metabolic activity.  Interval resolution of the left adnexal cystic lesion. -CT neck and chest done on 03/03/2018 reviewed and showed no evidence of recurrent tumor.  RT changes noted.  No metastatic disease noted in the chest. - Labs done on 03/20/2019 showed TSH 7.804 -She was placed on Synthroid 25 mg however she did not take the medication because she was unsure of what it was for. -I was seeing her back today for TSH recheck however she did not have labs drawn.  Synthroid was explained to patient and she will start taking the medication.  We will check her back in 2 months with a TSH recheck. -Labs done on 08/14/2019 showed TSH has come down to 6.932.  She is now taking her Synthroid 25 mg daily. -She reports her swallowing has improved. -She will follow up with Dr. Delton Coombes in September with repeat labs and scans.  2.  Hypothyroidism: -03/20/2019 showed her TSH 7.8. -Labs done on 08/14/2019 shows TSH 6.9 -Thyroid 25 mcg daily.     Orders placed this encounter:  Orders Placed This Encounter  Procedures  . Lactate dehydrogenase  . CBC with Differential/Platelet  . Comprehensive metabolic panel  .  TSH      Francene Finders, FNP-C [employer] [phone]

## 2019-09-13 ENCOUNTER — Ambulatory Visit (HOSPITAL_COMMUNITY): Payer: Self-pay

## 2019-09-13 ENCOUNTER — Inpatient Hospital Stay (HOSPITAL_COMMUNITY): Payer: Self-pay | Attending: Hematology

## 2019-09-20 ENCOUNTER — Ambulatory Visit (HOSPITAL_COMMUNITY): Payer: No Typology Code available for payment source | Admitting: Hematology

## 2019-09-20 ENCOUNTER — Encounter (HOSPITAL_COMMUNITY): Payer: Self-pay

## 2019-11-07 ENCOUNTER — Other Ambulatory Visit (HOSPITAL_COMMUNITY): Payer: Self-pay

## 2019-11-07 DIAGNOSIS — C090 Malignant neoplasm of tonsillar fossa: Secondary | ICD-10-CM

## 2019-11-07 DIAGNOSIS — E039 Hypothyroidism, unspecified: Secondary | ICD-10-CM

## 2019-11-08 ENCOUNTER — Ambulatory Visit (HOSPITAL_COMMUNITY)
Admission: RE | Admit: 2019-11-08 | Discharge: 2019-11-08 | Disposition: A | Discharge status: Home / Self Care | Payer: Self-pay | Source: Ambulatory Visit | Attending: Nurse Practitioner | Admitting: Nurse Practitioner

## 2019-11-08 ENCOUNTER — Other Ambulatory Visit: Payer: Self-pay

## 2019-11-08 ENCOUNTER — Encounter (HOSPITAL_COMMUNITY): Payer: Self-pay

## 2019-11-08 ENCOUNTER — Inpatient Hospital Stay (HOSPITAL_COMMUNITY): Payer: Self-pay | Attending: Hematology

## 2019-11-08 DIAGNOSIS — C090 Malignant neoplasm of tonsillar fossa: Secondary | ICD-10-CM | POA: Insufficient documentation

## 2019-11-08 DIAGNOSIS — E039 Hypothyroidism, unspecified: Secondary | ICD-10-CM

## 2019-11-08 LAB — COMPREHENSIVE METABOLIC PANEL
ALT: 14 U/L (ref 0–44)
AST: 16 U/L (ref 15–41)
Albumin: 4.2 g/dL (ref 3.5–5.0)
Alkaline Phosphatase: 52 U/L (ref 38–126)
Anion gap: 7 (ref 5–15)
BUN: 15 mg/dL (ref 6–20)
CO2: 25 mmol/L (ref 22–32)
Calcium: 9.4 mg/dL (ref 8.9–10.3)
Chloride: 103 mmol/L (ref 98–111)
Creatinine, Ser: 0.46 mg/dL (ref 0.44–1.00)
GFR, Estimated: >60 mL/min (ref >60)
Glucose, Bld: 105 mg/dL — ABNORMAL HIGH (ref 70–99)
Potassium: 4.1 mmol/L (ref 3.5–5.1)
Sodium: 135 mmol/L (ref 135–145)
Total Bilirubin: 0.6 mg/dL (ref 0.3–1.2)
Total Protein: 7.2 g/dL (ref 6.5–8.1)

## 2019-11-08 LAB — CBC WITH DIFFERENTIAL/PLATELET
Abs Immature Granulocytes: 0.01 10*3/uL (ref 0.00–0.07)
Basophils Absolute: 0 10*3/uL (ref 0.0–0.1)
Basophils Relative: 1 %
Eosinophils Absolute: 0.1 10*3/uL (ref 0.0–0.5)
Eosinophils Relative: 3 %
HCT: 38.6 % (ref 36.0–46.0)
Hemoglobin: 12.7 g/dL (ref 12.0–15.0)
Immature Granulocytes: 0 %
Lymphocytes Relative: 20 %
Lymphs Abs: 0.9 10*3/uL (ref 0.7–4.0)
MCH: 31.4 pg (ref 26.0–34.0)
MCHC: 32.9 g/dL (ref 30.0–36.0)
MCV: 95.5 fL (ref 80.0–100.0)
Monocytes Absolute: 0.3 10*3/uL (ref 0.1–1.0)
Monocytes Relative: 7 %
Neutro Abs: 3.3 10*3/uL (ref 1.7–7.7)
Neutrophils Relative %: 69 %
Platelets: 225 10*3/uL (ref 150–400)
RBC: 4.04 MIL/uL (ref 3.87–5.11)
RDW: 13.1 % (ref 11.5–15.5)
WBC: 4.7 10*3/uL (ref 4.0–10.5)
nRBC: 0 % (ref 0.0–0.2)

## 2019-11-08 LAB — LACTATE DEHYDROGENASE: LDH: 116 U/L (ref 98–192)

## 2019-11-08 LAB — TSH: TSH: 2.544 u[IU]/mL (ref 0.350–4.500)

## 2019-11-08 MED ORDER — IOHEXOL 300 MG/ML  SOLN
75.0000 mL | Freq: Once | INTRAMUSCULAR | Status: AC | PRN
  Administered 2019-11-08: 75 mL via INTRAVENOUS

## 2019-11-15 ENCOUNTER — Ambulatory Visit (HOSPITAL_COMMUNITY): Payer: Self-pay | Admitting: Oncology

## 2019-11-15 ENCOUNTER — Encounter: Payer: Self-pay | Admitting: Family Medicine

## 2019-11-15 ENCOUNTER — Ambulatory Visit (INDEPENDENT_AMBULATORY_CARE_PROVIDER_SITE_OTHER): Payer: Self-pay | Admitting: Family Medicine

## 2019-11-15 ENCOUNTER — Ambulatory Visit (HOSPITAL_COMMUNITY): Payer: Self-pay | Admitting: Nurse Practitioner

## 2019-11-15 VITALS — BP 117/72 | HR 76 | Ht 62.0 in | Wt 140.6 lb

## 2019-11-15 DIAGNOSIS — Z30432 Encounter for removal of intrauterine contraceptive device: Secondary | ICD-10-CM

## 2019-11-15 DIAGNOSIS — Z3009 Encounter for other general counseling and advice on contraception: Secondary | ICD-10-CM

## 2019-11-15 DIAGNOSIS — Z124 Encounter for screening for malignant neoplasm of cervix: Secondary | ICD-10-CM

## 2019-11-15 MED ORDER — NORETHINDRONE 0.35 MG PO TABS: 1.0000 | ORAL_TABLET | Freq: Every day | ORAL | 11 refills | Status: AC

## 2019-11-15 NOTE — Progress Notes (Signed)
    GYNECOLOGY OFFICE PROCEDURE NOTE  Heather Strickland is a 44 y.o. [id number] here for [place] IUD removal. No GYN concerns.  Last pap smear was on 08/2016 and was normal.  IUD Removal  Patient identified, informed consent performed, consent signed.  Patient was in the dorsal lithotomy position, normal external genitalia was noted.  A speculum was placed in the patient's vagina, normal discharge was noted, no lesions. The cervix was visualized, no lesions, no abnormal discharge.  The strings of the IUD were grasped and pulled using ring forceps. The IUD was removed in its entirety.  Patient tolerated the procedure well.    Patient will use POPs for contraception.  Routine preventative health maintenance measures emphasized.   Clarnce Flock, MD/[facility], [facility] for [employer], [place]

## 2019-11-15 NOTE — Addendum Note (Signed)
Addended by: Octaviano Glow on: 11/15/2019 11:49 AM   Modules accepted: Orders

## 2019-11-15 NOTE — Addendum Note (Signed)
Addended by: Octaviano Glow on: 11/15/2019 12:12 PM   Modules accepted: Orders

## 2019-11-15 NOTE — Progress Notes (Signed)
[facility] Visit  @DATE @            Patient name: Heather Strickland MRN [id number]  Date of birth: 06-07-1975  CC & HPI:  Heather Strickland is a 44 y.o. female presenting today for IUD removal.  Per review of chart was placed at health department in 2016 Patient reports it was placed for heavy periods and that they improved after it was placed, but she does not member which type was placed States she was like to have it removed as it this time, and that she would like to switch to pills  ROS:  Pertinent positives and negative per HPI, all others reviewed and negative   Pertinent History Reviewed:   Reviewed: Significant for history of tonsillar cancer Medical         Past Medical History:  Diagnosis Date  . Anemia   . GERD (gastroesophageal reflux disease)   . History of radiation therapy 03/08/2016 - 04/23/2016   Left Tonsil and Bilateral Neck  . HPV in female   . Squamous cell carcinoma of left tonsil ([place]) 01/27/2016                              Surgical Hx:    Past Surgical History:  Procedure Laterality Date  . CESAREAN SECTION  [id number]  . ESOPHAGOGASTRODUODENOSCOPY N/A 11/07/2015   Dr. Gala Romney: normal esophagus, chronic gastritis   . IR GASTROSTOMY TUBE REMOVAL  10/08/2016  . IR GENERIC HISTORICAL  02/11/2016   IR FLUORO GUIDE PORT INSERTION RIGHT 02/11/2016 Sandi Mariscal, MD [facility]  . IR GENERIC HISTORICAL  02/11/2016   IR US GUIDE VASC ACCESS RIGHT 02/11/2016 Sandi Mariscal, MD [facility]  . IR GENERIC HISTORICAL  02/11/2016   IR GASTROSTOMY TUBE MOD SED 02/11/2016 Sandi Mariscal, MD [facility]  . IR REMOVAL TUN ACCESS W/ PORT W/O FL MOD SED  01/12/2017  . MULTIPLE EXTRACTIONS WITH ALVEOLOPLASTY N/A 02/20/2016   Procedure: Extraction of tooth #'s 15,17,18 and 32 with alveoloplasty and dental cleaning of teeth;  Surgeon: Lenn Cal, DDS;  Location: [facility];  Service: Oral Surgery;  Laterality: N/A;   Medications: Reviewed & Updated - see  associated section                       Current Outpatient Medications:  .  ferrous sulfate 325 (65 FE) MG tablet, Take 1 tablet (325 mg total) by mouth daily., Disp: 30 tablet, Rfl: 0 .  hydrOXYzine (ATARAX/VISTARIL) 25 MG tablet, Take 1 tablet (25 mg total) by mouth every 12 (twelve) hours as needed for anxiety., Disp: 30 tablet, Rfl: 0 .  levonorgestrel (MIRENA) 20 MCG/24HR IUD, 1 each by Intrauterine route once., Disp: , Rfl:  .  levothyroxine (SYNTHROID) 25 MCG tablet, Take 1 tablet (25 mcg total) by mouth daily before breakfast., Disp: 30 tablet, Rfl: 5 .  loratadine (CLARITIN) 10 MG tablet, Take 10 mg by mouth daily., Disp: , Rfl:  .  Multiple Vitamins-Minerals (MULTIVITAMIN WITH MINERALS) tablet, Take 1 tablet by mouth daily., Disp: , Rfl:    Social History: Reviewed -  reports that she has never smoked. She has never used smokeless tobacco.  Objective Findings:  Vitals: Blood pressure 117/72, pulse 76, height 5\' 2"  (1.575 m), weight 140 lb 9.6 oz (63.8 kg).  PHYSICAL EXAMINATION General appearance - alert, well appearing, and in no distress Mental status - alert, oriented  to person, place, and time Chest -comfortable breathing on room air Heart - not examined Abdomen - not examined Breasts - breasts appear normal, no suspicious masses, no skin or nipple changes or axillary nodes, not examined Skin - normal coloration and turgor, no rashes, no suspicious skin lesions noted  PELVIC External genitalia -normal, no lesions or rashes Vulva -normal, no lesions or rashes Vagina -normal, no lesions or rashes Cervix -multiparous os, no lesions present, IUD strings visible at os Uterus -not examined Adnexa -not examined [employer] -not performed   Assessment & Plan:   A:  1. Here for management of her IUD removal and contraception counseling 2. Discussed we should avoid estrogen-containing contraceptives given her history of cancer  P:  1. IUD removed easily, appears to be a  Mirena 2. Prescribed POPs, reinforced the importance of taking pills on time for efficacy 3. If she dislikes them or menorrhagia returns she will seek new IUD at the health department

## 2019-11-19 ENCOUNTER — Inpatient Hospital Stay (HOSPITAL_COMMUNITY): Payer: Self-pay | Attending: Nurse Practitioner | Admitting: Oncology

## 2019-11-19 ENCOUNTER — Other Ambulatory Visit: Payer: Self-pay

## 2019-11-19 VITALS — BP 111/70 | HR 73 | Temp 98.5°F | Resp 17 | Wt 143.2 lb

## 2019-11-19 DIAGNOSIS — R1310 Dysphagia, unspecified: Secondary | ICD-10-CM | POA: Insufficient documentation

## 2019-11-19 DIAGNOSIS — Z79899 Other long term (current) drug therapy: Secondary | ICD-10-CM | POA: Insufficient documentation

## 2019-11-19 DIAGNOSIS — C090 Malignant neoplasm of tonsillar fossa: Secondary | ICD-10-CM

## 2019-11-19 DIAGNOSIS — Z23 Encounter for immunization: Secondary | ICD-10-CM

## 2019-11-19 DIAGNOSIS — F418 Other specified anxiety disorders: Secondary | ICD-10-CM | POA: Insufficient documentation

## 2019-11-19 DIAGNOSIS — Z923 Personal history of irradiation: Secondary | ICD-10-CM | POA: Insufficient documentation

## 2019-11-19 DIAGNOSIS — Z8619 Personal history of other infectious and parasitic diseases: Secondary | ICD-10-CM | POA: Insufficient documentation

## 2019-11-19 DIAGNOSIS — K219 Gastro-esophageal reflux disease without esophagitis: Secondary | ICD-10-CM | POA: Insufficient documentation

## 2019-11-19 DIAGNOSIS — M62838 Other muscle spasm: Secondary | ICD-10-CM | POA: Insufficient documentation

## 2019-11-19 DIAGNOSIS — Z85818 Personal history of malignant neoplasm of other sites of lip, oral cavity, and pharynx: Secondary | ICD-10-CM | POA: Insufficient documentation

## 2019-11-19 DIAGNOSIS — E039 Hypothyroidism, unspecified: Secondary | ICD-10-CM | POA: Insufficient documentation

## 2019-11-19 DIAGNOSIS — Z9119 Patient's noncompliance with other medical treatment and regimen: Secondary | ICD-10-CM | POA: Insufficient documentation

## 2019-11-19 MED ORDER — INFLUENZA VAC A&B SA ADJ QUAD 0.5 ML IM PRSY: 0.5000 mL | PREFILLED_SYRINGE | Freq: Once | INTRAMUSCULAR | Status: DC

## 2019-11-19 MED ORDER — INFLUENZA VAC SPLIT QUAD 0.5 ML IM SUSY: 0.5000 mL | PREFILLED_SYRINGE | Freq: Once | INTRAMUSCULAR | Status: DC

## 2019-11-19 NOTE — Progress Notes (Signed)
[name], [name]   CLINIC:  Medical Oncology/Hematology  PCP:  Heather Dryer, PA-C [place] [address] [phone]   REASON FOR VISIT: Follow-up for cancer of the tonsillar fossa   CURRENT THERAPY: Observation  BRIEF ONCOLOGIC HISTORY:  Oncology History  Cancer of tonsillar fossa ([place])  09/1926 Procedure   Gastrostomy tube removed.   01/15/2016 Procedure   Left tonsil biopsy by Dr. Benjamine Strickland   01/15/2016 Initial Diagnosis   She presented to Dr. Benjamine Strickland after being referred by Heather Kaufman, NP (GI) with a 5-6 month history of severe sore throat.  The patient never sought treatment for this issue previously.  Over the last 3 months, patient reported intermittent bleeding from left tonsil.  She also feels a hard mass along the left side of neck.  On physical exam by Dr. Benjamine Strickland, he noted 3+ ulceration and erythema on the left tonsil.  On flexible laryngoscopy, tonsillar hypertrophy with ulceration was noted on the left.   01/19/2016 Pathology Results   Invasive squamous cell carcinoma, poorly differentiated.   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 Fine soft tissue details lost at these levels.  Pharyngeal mass centered in the left palatini tonsil with surround mucosal thickening as described below probably representing neoplasm.  Mucosal thickening of the left aspect of hypopharynx and pharynx with inferior most extension to the margin of the supraglottic airway without appreciable invasion into epiglottis, aryepiglottic folds, vocal cords, or paraglottic fat.  Superior most extension of mucosal thickening to the left aspect of the uvula and along the left aspect of the soft palate to the hard/soft palate junction, this region is obscured by motion artifact.  Anteriorly  there is effacement of left glossopharyngeal sulcus without appreciable tongue base invasion, this region is obscured by motion artifact.  Asymmetric enlargement of left-sided level 2 and 3 cervical lymph nodes possibly metastatic. No evidence for lymph node necrosis or extra nodal extension.   01/29/2016 Imaging   CT chest-  No evidence of metastatic disease in the chest.   02/06/2016 PET scan   1. Highly hypermetabolic left palatine tonsillar mass, maximum SUV 38.5. 2. A left station IIa lymph node measure 1.1 cm in short axis, mildly enlarged on image 38/3, but has only a borderline elevated SUV at 3.5. 3. Other imaging findings of potential clinical significance: Mild cardiomegaly. Calcified granuloma in the superior segment right lower lobe (benign). IUD satisfactorily positioned in the uterus.   02/11/2016 Procedure   Successful fluoroscopic insertion of a 20-French pull-through gastrostomy tube.   02/11/2016 Procedure   Successful placement of a right internal jugular approach power injectable Port-A-Cath. The catheter is ready for immediate use.   03/08/2016 - 04/23/2016 Radiation Therapy   Radiation alone Heather Strickland). Left Tonsil and Bilateral neck / 70 Gy in 35 fractions to gross disease, 63 Gy in 35 fractions to high risk nodal echelons, and 56 Gy in 35 fractions to intermediate risk nodal echelons   10/08/2016 Procedure   Pull-through gastrostomy tube removed.   01/12/2017 Procedure   Port removed by IR     CANCER STAGING: Cancer Staging Cancer of tonsillar fossa ([place]) Staging form: Pharynx - HPV-Mediated Oropharynx, AJCC 8th Edition - Clinical stage from 01/30/2016: Stage I (cT2, cN1, cM0, p16: Positive) - Signed by Heather Pane  S, PA-C on 02/18/2016 - Pathologic: No stage assigned - Unsigned - Clinical: cT3 - Unsigned    INTERVAL HISTORY:  Heather Strickland 44 y.o. female returns for routine follow-up for cancer of the tonsillar fossa.  Patient reports her  swallowing has improved over the past few months.  She has intermittent left sided neck muscle spasms.  She thinks it could be related to how she is sleeping or anxiety.  Her dysphagia has improved dramatically.  She drinks fluids with dry foods but otherwise does not have any problems.  She admits to being compliant with her Synthroid.  She denies any pain at this time. Denies any nausea, vomiting, or diarrhea. Denies any new pains. Had not noticed any recent bleeding such as epistaxis, hematuria or hematochezia. Denies recent chest pain on exertion, shortness of breath on minimal exertion, pre-syncopal episodes, or palpitations. Denies any numbness or tingling in hands or feet. Denies any recent fevers, infections, or recent hospitalizations. Patient reports appetite at 75% and energy level at 75%.  She is eating well maintain her weight at this time.    REVIEW OF SYSTEMS:  Review of Systems  HENT:   Positive for trouble swallowing (has improved from last visit).   Psychiatric/Behavioral: Positive for depression. The patient is nervous/anxious.   All other systems reviewed and are negative.    PAST MEDICAL/SURGICAL HISTORY:  Past Medical History:  Diagnosis Date  . Anemia   . GERD (gastroesophageal reflux disease)   . History of radiation therapy 03/08/2016 - 04/23/2016   Left Tonsil and Bilateral Neck  . HPV in female   . 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 CESAREAN SECTION  [id number]  . ESOPHAGOGASTRODUODENOSCOPY N/A 11/07/2015   Dr. Gala Strickland: normal esophagus, chronic gastritis   . IR GASTROSTOMY TUBE REMOVAL  10/08/2016  . IR GENERIC HISTORICAL  02/11/2016   IR FLUORO GUIDE PORT INSERTION RIGHT 02/11/2016 Heather Mariscal, MD [facility]  . IR GENERIC HISTORICAL  02/11/2016   IR US GUIDE VASC ACCESS RIGHT 02/11/2016 Heather Mariscal, MD [facility]  . IR GENERIC HISTORICAL  02/11/2016   IR GASTROSTOMY TUBE MOD SED  02/11/2016 Heather Mariscal, MD [facility]  . IR REMOVAL TUN ACCESS W/ PORT W/O FL MOD SED  01/12/2017  . MULTIPLE EXTRACTIONS WITH ALVEOLOPLASTY N/A 02/20/2016   Procedure: Extraction of tooth #'Strickland 15,17,18 and 32 with alveoloplasty and dental cleaning of teeth;  Surgeon: Lenn Cal, DDS;  Location: [facility];  Service: Oral Surgery;  Laterality: N/A;     SOCIAL HISTORY:  Social History   Socioeconomic History  . Marital status: Married    Spouse name: Not on file  . Number of children: 4  . Years of education: Not on file  . Highest education level: 8th grade  Occupational History  . Not on file  Tobacco Use  . Smoking status: Never Smoker  . Smokeless tobacco: Never Used  Vaping Use  . Vaping Use: Never used  Substance and Sexual Activity  . Alcohol use: No  . Drug use: No  . Sexual activity: Not Currently    Birth control/protection: I.U.D.  Other Topics Concern  . Not on file  Social History Narrative  . Not on file   Social Determinants of Health   Financial Resource Strain: Medium Risk  . Difficulty of Paying Living Expenses: Somewhat hard  Food Insecurity: No Food Insecurity  . Worried About Charity fundraiser  in the Last Year: Never true  . Ran Out of Food in the Last Year: Never true  Transportation Needs: No Transportation Needs  . Lack of Transportation (Medical): No  . Lack of Transportation (Non-Medical): No  Physical Activity: Insufficiently Active  . Days of Exercise per Week: 1 day  . Minutes of Exercise per Session: 30 min  Stress: No Stress Concern Present  . Feeling of Stress : Only a little  Social Connections: Moderately Isolated  . Frequency of Communication with Friends and Family: More than three times a week  . Frequency of Social Gatherings with Friends and Family: Never  . Attends Religious Services: More than 4 times per year  . Active Member of Clubs or Organizations: No  . Attends Archivist Meetings: Never  . Marital Status:  Separated  Intimate Partner Violence: Not At Risk  . Fear of Current or Ex-Partner: No  . Emotionally Abused: No  . Physically Abused: No  . Sexually Abused: No    FAMILY HISTORY:  Family History  Problem Relation Age of Onset  . Diabetes Mother   . Hypertension Mother   . Prostatitis Father   . ADD / ADHD Daughter   . Cerebral palsy Son     CURRENT MEDICATIONS:  Outpatient Encounter Medications as of 11/19/2019  Medication Sig  . ferrous sulfate 325 (65 FE) MG tablet Take 1 tablet (325 mg total) by mouth daily.  . hydrOXYzine (ATARAX/VISTARIL) 25 MG tablet Take 1 tablet (25 mg total) by mouth every 12 (twelve) hours as needed for anxiety.  Marland Kitchen levothyroxine (SYNTHROID) 25 MCG tablet Take 1 tablet (25 mcg total) by mouth daily before breakfast.  . loratadine (CLARITIN) 10 MG tablet Take 10 mg by mouth daily.  . Multiple Vitamins-Minerals (MULTIVITAMIN WITH MINERALS) tablet Take 1 tablet by mouth daily.  . norethindrone (MICRONOR) 0.35 MG tablet Take 1 tablet (0.35 mg total) by mouth daily.   Facility-Administered Encounter Medications as of 11/19/2019  Medication Note  . influenza vac split quadrivalent PF (FLUARIX) injection 0.5 mL   . [DISCONTINUED] influenza vaccine adjuvanted (FLUAD) injection 0.5 mL 11/19/2019: high dose?    ALLERGIES:  Allergies  Allergen Reactions  . No Known Allergies      PHYSICAL EXAM:  ECOG Performance status: 1  Vitals:   11/19/19 1131  BP: 111/70  Pulse: 73  Resp: 17  Temp: 98.5 F (36.9 C)  SpO2: 100%   Filed Weights   11/19/19 1131  Weight: 143 lb 3.2 oz (65 kg)   Physical Exam Constitutional:      Appearance: Normal appearance. She is normal weight.  Cardiovascular:     Rate and Rhythm: Normal rate and regular rhythm.     Heart sounds: Normal heart sounds.  Pulmonary:     Effort: Pulmonary effort is normal.     Breath sounds: Normal breath sounds.  Abdominal:     General: Bowel sounds are normal.     Palpations: Abdomen  is soft.  Musculoskeletal:        General: Normal range of motion.  Skin:    General: Skin is warm.  Neurological:     Mental Status: She is alert and oriented to person, place, and time. Mental status is at baseline.  Psychiatric:        Mood and Affect: Mood normal.        Behavior: Behavior normal.        Thought Content: Thought content normal.  Judgment: Judgment normal.      LABORATORY DATA:  I have reviewed the labs as listed.  CBC    Component Value Date/Time   WBC 4.7 11/08/2019 1411   RBC 4.04 11/08/2019 1411   HGB 12.7 11/08/2019 1411   HGB 11.4 (L) 03/26/2016 1531   HCT 38.6 11/08/2019 1411   HCT 33.4 (L) 03/26/2016 1531   PLT 225 11/08/2019 1411   PLT 213 03/26/2016 1531   MCV 95.5 11/08/2019 1411   MCV 90.8 03/26/2016 1531   MCH 31.4 11/08/2019 1411   MCHC 32.9 11/08/2019 1411   RDW 13.1 11/08/2019 1411   RDW 13.1 03/26/2016 1531   LYMPHSABS 0.9 11/08/2019 1411   LYMPHSABS 0.5 (L) 03/26/2016 1531   MONOABS 0.3 11/08/2019 1411   MONOABS 0.3 03/26/2016 1531   EOSABS 0.1 11/08/2019 1411   EOSABS 0.1 03/26/2016 1531   BASOSABS 0.0 11/08/2019 1411   BASOSABS 0.0 03/26/2016 1531   CMP Latest Ref Rng & Units 11/08/2019 08/14/2019 03/20/2019  Glucose 70 - 99 mg/dL 105(H) 108(H) 117(H)  BUN 6 - 20 mg/dL 15 13 13   Creatinine 0.44 - 1.00 mg/dL 0.46 0.62 0.46  Sodium 135 - 145 mmol/L 135 137 136  Potassium 3.5 - 5.1 mmol/L 4.1 3.7 3.9  Chloride 98 - 111 mmol/L 103 107 100  CO2 22 - 32 mmol/L 25 23 26   Calcium 8.9 - 10.3 mg/dL 9.4 8.7(L) 9.4  Total Protein 6.5 - 8.1 g/dL 7.2 6.9 7.3  Total Bilirubin 0.3 - 1.2 mg/dL 0.6 0.6 0.7  Alkaline Phos 38 - 126 U/L 52 53 51  AST 15 - 41 U/L 16 23 19   ALT 0 - 44 U/L 14 15 14     All questions were answered to patient'Strickland stated satisfaction. Encouraged patient to call with any new concerns or questions before his next visit to the cancer center and we can certain see him sooner, if needed.     ASSESSMENT & PLAN:    1. 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 -She was treated with radiation therapy alone by Dr. Isidore Strickland; completed treatment on 04/23/2016. -PET scan on 08/17/2016 showed interval resolution of the left pharyngeal mass.  No significant residual hypermetabolic activity or CT abnormality is observed in this location.  No regional adenopathy or evidence of distant metastatic spread.  5.5 x 4.2 left adnexal cyst is new and photopenic.  Follow-up pelvic sonogram is recommended 10 to 12 weeks. - Pelvic ultrasound done on 02/04/2017 showed bilateral ovaries not visualized, obscured by bowel gas.  Heterogeneous appearance of the endometrium, with poorly defined transitional zone.  OB/GYN consultation and possibly MRI of the pelvis may be considered for further evaluation. -She was seen by GYN and is following up as recommended. -PET scan done on 08/15/2017 showed no evidence of local recurrence in the pharyngeal mucosal space or metastatic disease.  No abnormal metabolic activity.  Interval resolution of the left adnexal cystic lesion. -CT neck and chest done on 03/03/2018 reviewed and showed no evidence of recurrent tumor.  RT changes noted.  No metastatic disease noted in the chest. -She had repeat CT soft tissue neck on 11/08/2019 revealing a normal CT of the neck with no recurrent mass or adenopathy. -Repeat scans yearly.  Return to clinic in 6 months for follow-up and assessment with lab work.  2.  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 -TSH has improved from 7.8  to now 2.544. -She has been asked to continue her Synthroid indefinitely.  Disposition: RTC in 6 months for repeat labs and assessment.  No problem-specific Assessment & Plan notes found for this encounter.  Greater than 50% was spent in counseling and coordination of care with this patient including but not limited to  discussion of the relevant topics above (See A&P) including, but not limited to diagnosis and management of acute and chronic medical conditions.     Orders placed this encounter:  No orders of the defined types were placed in this encounter.  Faythe Casa, NP 11/19/2019 3:24 PM  [place] [phone]

## 2019-12-12 ENCOUNTER — Ambulatory Visit: Payer: Self-pay | Admitting: Physician Assistant

## 2019-12-12 ENCOUNTER — Encounter: Payer: Self-pay | Admitting: Physician Assistant

## 2019-12-12 VITALS — BP 110/70 | HR 73 | Temp 97.0°F | Ht 62.0 in | Wt 144.0 lb

## 2019-12-12 DIAGNOSIS — Z789 Other specified health status: Secondary | ICD-10-CM

## 2019-12-12 DIAGNOSIS — E039 Hypothyroidism, unspecified: Secondary | ICD-10-CM

## 2019-12-12 MED ORDER — LEVOTHYROXINE SODIUM 25 MCG PO TABS: ORAL_TABLET | ORAL | 5 refills | Status: DC

## 2019-12-12 NOTE — Progress Notes (Signed)
BP 110/70   Pulse 73   Temp (!) 97 F (36.1 C)   Ht 5\' 2"  (1.575 m)   Wt 144 lb (65.3 kg)   SpO2 98%   BMI 26.34 kg/m    Subjective:    Patient ID: Heather Strickland, female    DOB: 1975-12-17, 44 y.o.   MRN: [id number]  HPI: Heather Strickland is a 44 y.o. female presenting on 12/12/2019 for No chief complaint on file.   HPI  Pt had a negative covid 19 screening questionnaire.     Pt is 89yoF with routine follow up.    -For her history CA- she is still seeing oncology regularly -birth control-  She had her iud removed.  Now using ocp -Thyroid- tsh good October 28 and she is feeling well -mammogram- utd -covid vaccine- she has not yet gottten it   She has Ringing L ear sometimes. It started about a month ago.  It Lasts about a minute  She says her mouth has been dry and she is requesting whatever oncology gave her in the past when she was getting treatment.       Relevant past medical, surgical, family and social history reviewed and updated as indicated. Interim medical history since our last visit reviewed. Allergies and medications reviewed and updated.   Current Outpatient Medications:  .  ferrous sulfate 325 (65 FE) MG tablet, Take 1 tablet (325 mg total) by mouth daily., Disp: 30 tablet, Rfl: 0 .  hydrOXYzine (ATARAX/VISTARIL) 25 MG tablet, Take 1 tablet (25 mg total) by mouth every 12 (twelve) hours as needed for anxiety., Disp: 30 tablet, Rfl: 0 .  levothyroxine (SYNTHROID) 25 MCG tablet, Take 1 tablet (25 mcg total) by mouth daily before breakfast., Disp: 30 tablet, Rfl: 5 .  loratadine (CLARITIN) 10 MG tablet, Take 10 mg by mouth daily as needed. , Disp: , Rfl:  .  Multiple Vitamins-Minerals (MULTIVITAMIN WITH MINERALS) tablet, Take 1 tablet by mouth daily., Disp: , Rfl:  .  norethindrone (MICRONOR) 0.35 MG tablet, Take 1 tablet (0.35 mg total) by mouth daily., Disp: 28 tablet, Rfl: 11     Review of Systems  Per HPI unless specifically  indicated above     Objective:    BP 110/70   Pulse 73   Temp (!) 97 F (36.1 C)   Ht 5\' 2"  (1.575 m)   Wt 144 lb (65.3 kg)   SpO2 98%   BMI 26.34 kg/m   Wt Readings from Last 3 Encounters:  12/12/19 144 lb (65.3 kg)  11/19/19 143 lb 3.2 oz (65 kg)  11/15/19 140 lb 9.6 oz (63.8 kg)    Physical Exam Vitals reviewed.  Constitutional:      General: She is not in acute distress.    Appearance: She is well-developed. She is not ill-appearing.  HENT:     Head: Normocephalic and atraumatic.     Right Ear: Tympanic membrane and ear canal normal.     Left Ear: Tympanic membrane and ear canal normal.  Cardiovascular:     Rate and Rhythm: Normal rate and regular rhythm.  Pulmonary:     Effort: Pulmonary effort is normal.     Breath sounds: Normal breath sounds.  Abdominal:     General: Bowel sounds are normal.     Palpations: Abdomen is soft. There is no mass.     Tenderness: There is no abdominal tenderness.  Musculoskeletal:     Cervical back: Neck supple. No tenderness.  Right lower leg: No edema.     Left lower leg: No edema.  Lymphadenopathy:     Cervical: No cervical adenopathy.  Skin:    General: Skin is warm and dry.  Neurological:     Mental Status: She is alert and oriented to person, place, and time.  Psychiatric:        Attention and Perception: Attention normal.        Speech: Speech normal.        Behavior: Behavior normal. Behavior is cooperative.           Assessment & Plan:    Encounter Diagnoses  Name Primary?  . Hypothyroidism (acquired)   . Not proficient in English language Yes      -no changes to medications today  -review of epic Looks like it was fluoride which she was given and is talking about that she used for dry mouth.  She she doesn't need that now due to she isn't getting treatment any longer.  Recommended she try sugar-free hard candies to stimulate saliva production -pt to follow up in May.  will recheck lipids, a1c at  that time.  She is to contact office sooner if needed

## 2020-05-07 ENCOUNTER — Other Ambulatory Visit: Payer: Self-pay | Admitting: Physician Assistant

## 2020-05-07 DIAGNOSIS — E039 Hypothyroidism, unspecified: Secondary | ICD-10-CM

## 2020-05-07 DIAGNOSIS — E7800 Pure hypercholesterolemia, unspecified: Secondary | ICD-10-CM

## 2020-05-07 DIAGNOSIS — R7303 Prediabetes: Secondary | ICD-10-CM

## 2020-05-14 ENCOUNTER — Other Ambulatory Visit (HOSPITAL_COMMUNITY): Payer: Self-pay

## 2020-05-14 DIAGNOSIS — E039 Hypothyroidism, unspecified: Secondary | ICD-10-CM

## 2020-05-14 DIAGNOSIS — C090 Malignant neoplasm of tonsillar fossa: Secondary | ICD-10-CM

## 2020-05-14 NOTE — Progress Notes (Signed)
Orders placed per Faythe Casa, NP note from 11/19/2019.

## 2020-05-15 ENCOUNTER — Other Ambulatory Visit: Payer: Self-pay

## 2020-05-15 ENCOUNTER — Inpatient Hospital Stay (HOSPITAL_COMMUNITY): Payer: Self-pay | Attending: Hematology

## 2020-05-15 DIAGNOSIS — E039 Hypothyroidism, unspecified: Secondary | ICD-10-CM | POA: Insufficient documentation

## 2020-05-15 DIAGNOSIS — Z85818 Personal history of malignant neoplasm of other sites of lip, oral cavity, and pharynx: Secondary | ICD-10-CM | POA: Insufficient documentation

## 2020-05-15 DIAGNOSIS — Z923 Personal history of irradiation: Secondary | ICD-10-CM | POA: Insufficient documentation

## 2020-05-15 DIAGNOSIS — C090 Malignant neoplasm of tonsillar fossa: Secondary | ICD-10-CM

## 2020-05-15 DIAGNOSIS — Z79899 Other long term (current) drug therapy: Secondary | ICD-10-CM | POA: Insufficient documentation

## 2020-05-15 DIAGNOSIS — R0609 Other forms of dyspnea: Secondary | ICD-10-CM | POA: Insufficient documentation

## 2020-05-15 LAB — COMPREHENSIVE METABOLIC PANEL
ALT: 13 U/L (ref 0–44)
AST: 18 U/L (ref 15–41)
Albumin: 4.2 g/dL (ref 3.5–5.0)
Alkaline Phosphatase: 57 U/L (ref 38–126)
Anion gap: 7 (ref 5–15)
BUN: 17 mg/dL (ref 6–20)
CO2: 25 mmol/L (ref 22–32)
Calcium: 9.3 mg/dL (ref 8.9–10.3)
Chloride: 105 mmol/L (ref 98–111)
Creatinine, Ser: 0.56 mg/dL (ref 0.44–1.00)
GFR, Estimated: >60 mL/min (ref >60)
Glucose, Bld: 101 mg/dL — ABNORMAL HIGH (ref 70–99)
Potassium: 3.8 mmol/L (ref 3.5–5.1)
Sodium: 137 mmol/L (ref 135–145)
Total Bilirubin: 0.6 mg/dL (ref 0.3–1.2)
Total Protein: 7 g/dL (ref 6.5–8.1)

## 2020-05-15 LAB — CBC WITH DIFFERENTIAL/PLATELET
Abs Immature Granulocytes: 0.01 10*3/uL (ref 0.00–0.07)
Basophils Absolute: 0.1 10*3/uL (ref 0.0–0.1)
Basophils Relative: 1 %
Eosinophils Absolute: 0.4 10*3/uL (ref 0.0–0.5)
Eosinophils Relative: 8 %
HCT: 34.7 % — ABNORMAL LOW (ref 36.0–46.0)
Hemoglobin: 11.2 g/dL — ABNORMAL LOW (ref 12.0–15.0)
Immature Granulocytes: 0 %
Lymphocytes Relative: 24 %
Lymphs Abs: 1.1 10*3/uL (ref 0.7–4.0)
MCH: 30.8 pg (ref 26.0–34.0)
MCHC: 32.3 g/dL (ref 30.0–36.0)
MCV: 95.3 fL (ref 80.0–100.0)
Monocytes Absolute: 0.4 10*3/uL (ref 0.1–1.0)
Monocytes Relative: 8 %
Neutro Abs: 2.7 10*3/uL (ref 1.7–7.7)
Neutrophils Relative %: 59 %
Platelets: 225 10*3/uL (ref 150–400)
RBC: 3.64 MIL/uL — ABNORMAL LOW (ref 3.87–5.11)
RDW: 13.7 % (ref 11.5–15.5)
WBC: 4.7 10*3/uL (ref 4.0–10.5)
nRBC: 0 % (ref 0.0–0.2)

## 2020-05-15 LAB — LACTATE DEHYDROGENASE: LDH: 126 U/L (ref 98–192)

## 2020-05-15 LAB — TSH: TSH: 7.351 u[IU]/mL — ABNORMAL HIGH (ref 0.350–4.500)

## 2020-05-19 ENCOUNTER — Encounter: Payer: Self-pay | Admitting: Physician Assistant

## 2020-05-19 ENCOUNTER — Ambulatory Visit: Payer: Self-pay | Admitting: Physician Assistant

## 2020-05-19 ENCOUNTER — Other Ambulatory Visit: Payer: Self-pay

## 2020-05-19 VITALS — BP 132/80 | HR 92 | Temp 97.2°F | Wt 148.0 lb

## 2020-05-19 DIAGNOSIS — Z1239 Encounter for other screening for malignant neoplasm of breast: Secondary | ICD-10-CM

## 2020-05-19 DIAGNOSIS — Z789 Other specified health status: Secondary | ICD-10-CM

## 2020-05-19 DIAGNOSIS — E039 Hypothyroidism, unspecified: Secondary | ICD-10-CM

## 2020-05-19 MED ORDER — LEVOTHYROXINE SODIUM 50 MCG PO TABS: 50.0000 ug | ORAL_TABLET | Freq: Every day | ORAL | 4 refills | Status: DC

## 2020-05-19 NOTE — Progress Notes (Signed)
BP 132/80   Pulse 92   Temp (!) 97.2 F (36.2 C)   Wt 148 lb (67.1 kg)   SpO2 99%   BMI 27.07 kg/m    Subjective:    Patient ID: Heather Strickland, female    DOB: Nov 16, 1975, 45 y.o.   MRN: [id number]  HPI: Heather Strickland is a 45 y.o. female presenting on 05/19/2020 for No chief complaint on file.   HPI   Pt was scheduled for virtual appointment but showed up at the [facility]oor.  She had a negative covid 19 screening questionnaire.    Pt is [name] with hypothyroidism and history tonsillar cancer.    She had labs done but the ones ordered by oncology were done, not the ones ordered by [facility]  [name] for birth control.     She reports Feeling tired lately  She has Not gotten the covid vaccination    Relevant past medical, surgical, family and social history reviewed and updated as indicated. Interim medical history since our last visit reviewed. Allergies and medications reviewed and updated.   Current Outpatient Medications:  .  ferrous sulfate 325 (65 FE) MG tablet, Take 1 tablet (325 mg total) by mouth daily., Disp: 30 tablet, Rfl: 0 .  levothyroxine (SYNTHROID) 25 MCG tablet, Tome una tableta por boca diaria antes del desayuno., Disp: 30 tablet, Rfl: 5 .  loratadine (CLARITIN) 10 MG tablet, Take 10 mg by mouth daily as needed. , Disp: , Rfl:  .  Multiple Vitamins-Minerals (MULTIVITAMIN WITH MINERALS) tablet, Take 1 tablet by mouth daily., Disp: , Rfl:  .  hydrOXYzine (ATARAX/VISTARIL) 25 MG tablet, Take 1 tablet (25 mg total) by mouth every 12 (twelve) hours as needed for anxiety. (Patient not taking: Reported on 05/19/2020), Disp: 30 tablet, Rfl: 0 .  norethindrone (MICRONOR) 0.35 MG tablet, Take 1 tablet (0.35 mg total) by mouth daily. (Patient not taking: Reported on 05/19/2020), Disp: 28 tablet, Rfl: 11     Review of Systems  Per HPI unless specifically indicated above     Objective:    BP 132/80   Pulse 92   Temp (!) 97.2 F (36.2  C)   Wt 148 lb (67.1 kg)   SpO2 99%   BMI 27.07 kg/m   Wt Readings from Last 3 Encounters:  05/19/20 148 lb (67.1 kg)  12/12/19 144 lb (65.3 kg)  11/19/19 143 lb 3.2 oz (65 kg)    Physical Exam Vitals reviewed.  Constitutional:      General: She is not in acute distress.    Appearance: She is well-developed. She is not toxic-appearing.  HENT:     Head: Normocephalic and atraumatic.  Neck:     Thyroid: No thyroid tenderness.  Cardiovascular:     Rate and Rhythm: Normal rate and regular rhythm.  Pulmonary:     Effort: Pulmonary effort is normal.     Breath sounds: Normal breath sounds.  Abdominal:     General: Bowel sounds are normal.     Palpations: Abdomen is soft. There is no mass.     Tenderness: There is no abdominal tenderness.  Musculoskeletal:     Cervical back: Neck supple. No tenderness.     Right lower leg: No edema.     Left lower leg: No edema.  Lymphadenopathy:     Cervical: No cervical adenopathy.  Skin:    General: Skin is warm and dry.  Neurological:     Mental Status: She is alert and oriented to  person, place, and time.  Psychiatric:        Behavior: Behavior normal.     Results for orders placed or performed in visit on 05/15/20  CBC with Differential/Platelet  Result Value Ref Range   WBC 4.7 4.0 - 10.5 K/uL   RBC 3.64 (L) 3.87 - 5.11 MIL/uL   Hemoglobin 11.2 (L) 12.0 - 15.0 g/dL   HCT 34.7 (L) 36.0 - 46.0 %   MCV 95.3 80.0 - 100.0 fL   MCH 30.8 26.0 - 34.0 pg   MCHC 32.3 30.0 - 36.0 g/dL   RDW 13.7 11.5 - 15.5 %   Platelets 225 150 - 400 K/uL   nRBC 0.0 0.0 - 0.2 %   Neutrophils Relative % 59 %   Neutro Abs 2.7 1.7 - 7.7 K/uL   Lymphocytes Relative 24 %   Lymphs Abs 1.1 0.7 - 4.0 K/uL   Monocytes Relative 8 %   Monocytes Absolute 0.4 0.1 - 1.0 K/uL   Eosinophils Relative 8 %   Eosinophils Absolute 0.4 0.0 - 0.5 K/uL   Basophils Relative 1 %   Basophils Absolute 0.1 0.0 - 0.1 K/uL   Immature Granulocytes 0 %   Abs Immature  Granulocytes 0.01 0.00 - 0.07 K/uL  Comprehensive metabolic panel  Result Value Ref Range   Sodium 137 135 - 145 mmol/L   Potassium 3.8 3.5 - 5.1 mmol/L   Chloride 105 98 - 111 mmol/L   CO2 25 22 - 32 mmol/L   Glucose, Bld 101 (H) 70 - 99 mg/dL   BUN 17 6 - 20 mg/dL   Creatinine, Ser 0.56 0.44 - 1.00 mg/dL   Calcium 9.3 8.9 - 10.3 mg/dL   Total Protein 7.0 6.5 - 8.1 g/dL   Albumin 4.2 3.5 - 5.0 g/dL   AST 18 15 - 41 U/L   ALT 13 0 - 44 U/L   Alkaline Phosphatase 57 38 - 126 U/L   Total Bilirubin 0.6 0.3 - 1.2 mg/dL   GFR, Estimated >60 >60 mL/min   Anion gap 7 5 - 15  Lactate dehydrogenase  Result Value Ref Range   LDH 126 98 - 192 U/L  TSH  Result Value Ref Range   TSH 7.351 (H) 0.350 - 4.500 uIU/mL         Assessment & Plan:    Encounter Diagnoses  Name Primary?  . Hypothyroidism (acquired) Yes  . Not proficient in [place] language   . Encounter for screening for malignant neoplasm of breast, unspecified screening modality       -Reviewed results TSH with pt -will Increase levothyroxine -refer for screening Mammogram after 06/27/20 -PAP due 08/2021 -encouraged covid vacciantion -pt to continue with oncology per their recomendation -pt to follow up 3 months.  Will check a1c and lipids in 3 months when recheck thryoid.  Pt is to contact office sooner prn

## 2020-05-22 ENCOUNTER — Other Ambulatory Visit: Payer: Self-pay

## 2020-05-22 ENCOUNTER — Inpatient Hospital Stay (HOSPITAL_BASED_OUTPATIENT_CLINIC_OR_DEPARTMENT_OTHER): Payer: Self-pay | Admitting: Hematology

## 2020-05-22 VITALS — BP 111/73 | HR 71 | Temp 97.2°F | Resp 18 | Wt 150.4 lb

## 2020-05-22 DIAGNOSIS — C090 Malignant neoplasm of tonsillar fossa: Secondary | ICD-10-CM

## 2020-05-22 NOTE — Progress Notes (Signed)
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   01/15/2016 Procedure   Left tonsil biopsy by Dr. Benjamine Mola   01/15/2016 Initial Diagnosis   She presented to Dr. Benjamine Mola after being referred by [name], NP (GI) with a 5-6 month history of severe sore throat.  The patient never sought treatment for this issue previously.  Over the last 3 months, patient reported intermittent bleeding from left tonsil.  She also feels a hard mass along the left side of neck.  On physical exam by Dr. Benjamine Mola, he noted 3+ ulceration and erythema on the left tonsil.  On flexible laryngoscopy, tonsillar hypertrophy with ulceration was noted on the left.   01/19/2016 Pathology Results   Invasive squamous cell carcinoma, poorly differentiated.   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 Fine soft tissue details lost at these levels.  Pharyngeal mass centered in the left palatini tonsil with surround mucosal thickening as described below probably representing neoplasm.  Mucosal thickening of the left aspect of hypopharynx and pharynx with inferior most extension to the margin of the supraglottic airway without appreciable invasion into epiglottis, aryepiglottic folds, vocal cords, or paraglottic fat.  Superior most extension of mucosal thickening to the left aspect of the uvula and along the left aspect of the soft palate to the hard/soft palate junction, this region  is obscured by motion artifact.  Anteriorly there is effacement of left glossopharyngeal sulcus without appreciable tongue base invasion, this region is obscured by motion artifact.  Asymmetric enlargement of left-sided level 2 and 3 cervical lymph nodes possibly metastatic. No evidence for lymph node necrosis or extra nodal extension.   01/29/2016 Imaging   CT chest-  No evidence of metastatic disease in the chest.   02/06/2016 PET scan   1. Highly hypermetabolic left palatine tonsillar mass, maximum SUV 38.5. 2. A left station IIa lymph node measure 1.1 cm in short axis, mildly enlarged on image 38/3, but has only a borderline elevated SUV at 3.5. 3. Other imaging findings of potential clinical significance: Mild cardiomegaly. Calcified granuloma in the superior segment right lower lobe (benign). IUD satisfactorily positioned in the uterus.   02/11/2016 Procedure   Successful fluoroscopic insertion of a 20-French pull-through gastrostomy tube.   02/11/2016 Procedure   Successful placement of a right internal jugular approach power injectable Port-A-Cath. The catheter is ready for immediate use.   03/08/2016 - 04/23/2016 Radiation Therapy   Radiation alone Isidore Moos). Left Tonsil and Bilateral neck / 70 Gy in 35 fractions to gross disease, 63 Gy in 35 fractions to high risk nodal echelons, and 56 Gy in 35 fractions to intermediate risk nodal echelons   10/08/2016 Procedure   Pull-through gastrostomy tube removed.   01/12/2017 Procedure   Port removed by IR     CANCER STAGING: Cancer Staging Cancer of tonsillar fossa (Heather Strickland) Staging form: Pharynx - HPV-Mediated Oropharynx, AJCC 8th Edition - Clinical stage from 01/30/2016: Stage I (  cT2, cN1, cM0, p16: Positive) - Signed by Baird Cancer, PA-C on 02/18/2016 - Pathologic: No stage assigned - Unsigned - Clinical: cT3 - Unsigned   INTERVAL HISTORY:  [name], a 45 y.o. female, returns for routine follow-up of  her cancer of the tonsillar fossa. Horace was last seen on 11/19/2019.   Today she accompanied by an interpreter. She reports occasional dryness of throat accompanied by SOB she is able to swallow foods. She reports a line on the inside of her mouth.  REVIEW OF SYSTEMS:  Review of Systems  Constitutional: Positive for appetite change (80%) and fatigue (705).  Respiratory: Positive for shortness of breath (w/ exertion).   Neurological: Positive for headaches (due to lack of sleep) and numbness (hands).  Psychiatric/Behavioral: Positive for depression and sleep disturbance. The patient is nervous/anxious.   All other systems reviewed and are negative.   PAST MEDICAL/SURGICAL HISTORY:  Past Medical History:  Diagnosis Date  . Anemia   . GERD (gastroesophageal reflux disease)   . History of radiation therapy 03/08/2016 - 04/23/2016   Left Tonsil and Bilateral Neck  . HPV in female   . 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 CESAREAN SECTION  [id number]  . ESOPHAGOGASTRODUODENOSCOPY N/A 11/07/2015   Dr. Gala Romney: normal esophagus, chronic gastritis   . IR GASTROSTOMY TUBE REMOVAL  10/08/2016  . IR GENERIC HISTORICAL  02/11/2016   IR FLUORO GUIDE PORT INSERTION RIGHT 02/11/2016 Sandi Mariscal, MD [facility]  . IR GENERIC HISTORICAL  02/11/2016   IR US GUIDE VASC ACCESS RIGHT 02/11/2016 Sandi Mariscal, MD [facility]  . IR GENERIC HISTORICAL  02/11/2016   IR GASTROSTOMY TUBE MOD SED 02/11/2016 Sandi Mariscal, MD [facility]  . IR REMOVAL TUN ACCESS W/ PORT W/O FL MOD SED  01/12/2017  . MULTIPLE EXTRACTIONS WITH ALVEOLOPLASTY N/A 02/20/2016   Procedure: Extraction of tooth #'s 15,17,18 and 32 with alveoloplasty and dental cleaning of teeth;  Surgeon: Lenn Cal, DDS;  Location: [facility];  Service: Oral Surgery;  Laterality: N/A;    SOCIAL HISTORY:  Social History   Socioeconomic History  . Marital status: Married    Spouse  name: Not on file  . Number of children: 4  . Years of education: Not on file  . Highest education level: 8th grade  Occupational History  . Not on file  Tobacco Use  . Smoking status: Never Smoker  . Smokeless tobacco: Never Used  Vaping Use  . Vaping Use: Never used  Substance and Sexual Activity  . Alcohol use: No  . Drug use: No  . Sexual activity: Not Currently    Birth control/protection: I.U.D.  Other Topics Concern  . Not on file  Social History Narrative  . Not on file   Social Determinants of Health   Financial Resource Strain: Medium Risk  . Difficulty of Paying Living Expenses: Somewhat hard  Food Insecurity: No Food Insecurity  . Worried About Charity fundraiser in the Last Year: Never true  . Ran Out of Food in the Last Year: Never true  Transportation Needs: No Transportation Needs  . Lack of Transportation (Medical): No  . Lack of Transportation (Non-Medical): No  Physical Activity: Insufficiently Active  . Days of Exercise per Week: 1 day  . Minutes of Exercise per Session: 30 min  Stress: No Stress Concern Present  . Feeling of Stress : Only a little  Social Connections:  Moderately Isolated  . Frequency of Communication with Friends and Family: More than three times a week  . Frequency of Social Gatherings with Friends and Family: Never  . Attends Religious Services: More than 4 times per year  . Active Member of Clubs or Organizations: No  . Attends Archivist Meetings: Never  . Marital Status: Separated  Intimate Partner Violence: Not At Risk  . Fear of Current or Ex-Partner: No  . Emotionally Abused: No  . Physically Abused: No  . Sexually Abused: No    FAMILY HISTORY:  Family History  Problem Relation Age of Onset  . Diabetes Mother   . Hypertension Mother   . Prostatitis Father   . ADD / ADHD Daughter   . Cerebral palsy Son     CURRENT MEDICATIONS:  Current Outpatient Medications  Medication Sig Dispense Refill  .  ferrous sulfate 325 (65 FE) MG tablet Take 1 tablet (325 mg total) by mouth daily. 30 tablet 0  . hydrOXYzine (ATARAX/VISTARIL) 25 MG tablet Take 1 tablet (25 mg total) by mouth every 12 (twelve) hours as needed for anxiety. (Patient not taking: Reported on 05/19/2020) 30 tablet 0  . levothyroxine (SYNTHROID) 50 MCG tablet Take 1 tablet (50 mcg total) by mouth daily. Tome una tableta por boca diaria 30 tablet 4  . loratadine (CLARITIN) 10 MG tablet Take 10 mg by mouth daily as needed.     . Multiple Vitamins-Minerals (MULTIVITAMIN WITH MINERALS) tablet Take 1 tablet by mouth daily.    . norethindrone (MICRONOR) 0.35 MG tablet Take 1 tablet (0.35 mg total) by mouth daily. (Patient not taking: Reported on 05/19/2020) 28 tablet 11   No current facility-administered medications for this visit.    ALLERGIES:  Allergies  Allergen Reactions  . No Known Allergies     PHYSICAL EXAM:  Performance status (ECOG): 1 - Symptomatic but completely ambulatory  Vitals:   05/22/20 1600  BP: 111/73  Pulse: 71  Resp: 18  Temp: (!) 97.2 F (36.2 C)  SpO2: 98%   Wt Readings from Last 3 Encounters:  05/22/20 150 lb 6.4 oz (68.2 kg)  05/19/20 148 lb (67.1 kg)  12/12/19 144 lb (65.3 kg)   Physical Exam Vitals reviewed.  Constitutional:      Appearance: Normal appearance.  HENT:     Mouth/Throat:     Mouth: Mucous membranes are moist. No oral lesions.     Dentition: No gum lesions.     Tongue: No lesions.  Cardiovascular:     Rate and Rhythm: Normal rate and regular rhythm.     Pulses: Normal pulses.     Heart sounds: Normal heart sounds.  Pulmonary:     Effort: Pulmonary effort is normal.     Breath sounds: Normal breath sounds.  Lymphadenopathy:     Cervical:     Right cervical: No superficial cervical adenopathy.    Left cervical: No superficial cervical adenopathy.  Neurological:     General: No focal deficit present.     Mental Status: She is alert and oriented to person, place, and  time.  Psychiatric:        Mood and Affect: Mood normal.        Behavior: Behavior normal.      LABORATORY DATA:  I have reviewed the labs as listed.  CBC Latest Ref Rng & Units 05/15/2020 11/08/2019 08/14/2019  WBC 4.0 - 10.5 K/uL 4.7 4.7 3.9(L)  Hemoglobin 12.0 - 15.0 g/dL 11.2(L) 12.7 12.0  Hematocrit  36.0 - 46.0 % 34.7(L) 38.6 36.8  Platelets 150 - 400 K/uL 225 225 215   CMP Latest Ref Rng & Units 05/15/2020 11/08/2019 08/14/2019  Glucose 70 - 99 mg/dL 101(H) 105(H) 108(H)  BUN 6 - 20 mg/dL 17 15 13   Creatinine 0.44 - 1.00 mg/dL 0.56 0.46 0.62  Sodium 135 - 145 mmol/L 137 135 137  Potassium 3.5 - 5.1 mmol/L 3.8 4.1 3.7  Chloride 98 - 111 mmol/L 105 103 107  CO2 22 - 32 mmol/L 25 25 23   Calcium 8.9 - 10.3 mg/dL 9.3 9.4 8.7(L)  Total Protein 6.5 - 8.1 g/dL 7.0 7.2 6.9  Total Bilirubin 0.3 - 1.2 mg/dL 0.6 0.6 0.6  Alkaline Phos 38 - 126 U/L 57 52 53  AST 15 - 41 U/L 18 16 23   ALT 0 - 44 U/L 13 14 15     DIAGNOSTIC IMAGING:  I have independently reviewed the scans and discussed with the patient. No results found.   ASSESSMENT:  1. 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 -She was treated with radiation therapy alone by Dr. Isidore Moos; completed treatment on 04/23/2016. -PET scan on 08/17/2016 showed interval resolution of the left pharyngeal mass.  No significant residual hypermetabolic activity or CT abnormality is observed in this location.  No regional adenopathy or evidence of distant metastatic spread.  5.5 x 4.2 left adnexal cyst is new and photopenic.  Follow-up pelvic sonogram is recommended 10 to 12 weeks. - Pelvic ultrasound done on 02/04/2017 showed bilateral ovaries not visualized, obscured by bowel gas.  Heterogeneous appearance of the endometrium, with poorly defined transitional zone.  OB/GYN consultation and possibly MRI of the pelvis may be considered for further evaluation. -She was seen by GYN and is following up as  recommended. -PET scan done on 08/15/2017 showed no evidence of local recurrence in the pharyngeal mucosal space or metastatic disease.  No abnormal metabolic activity.  Interval resolution of the left adnexal cystic lesion. -CT neck and chest done on 03/03/2018 reviewed and showed no evidence of recurrent tumor.  RT changes noted.  No metastatic disease noted in the chest. -t CT soft tissue neck on 11/08/2019 revealing a normal CT of the neck with no recurrent mass or adenopathy.    PLAN:   1. 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 - Oropharyngeal exam did not reveal any masses.  No neck nodes.  No dysphagia or odynophagia. - She will come back in 6 months.  We will do 1 last CT soft tissue neck prior to next visit.   2.  Hypothyroidism: - This is from prior neck radiation. - Her TSH is elevated at 7.351. - Her Synthroid was increased to 50 mcg on 05/19/2020. - She will follow-up with Soyla Dryer.  I plan to repeat TSH in 6 months.  Orders placed this encounter:  No orders of the defined types were placed in this encounter.    Derek Jack, MD [facility] [phone]   I, Thana Ates, am acting as a scribe for Dr. Sanda Linger.  I, Derek Jack MD, have reviewed the above documentation for accuracy and completeness, and I agree with the above.

## 2020-05-22 NOTE — Patient Instructions (Addendum)
[place] at [facility] Discharge Instructions  You were seen today by Dr. Delton Coombes. He went over your recent results. You will be scheduled for a CT of your neck prior to your next visit. Dr. Delton Coombes will see you back in 6 months for labs and follow up.   Thank you for choosing [place] at [facility] to provide your oncology and hematology care.  To afford each patient quality time with our provider, please arrive at least 15 minutes before your scheduled appointment time.   If you have a lab appointment with the [facility] please come in thru the Main Entrance and check in at the main information desk  You need to re-schedule your appointment should you arrive 10 or more minutes late.  We strive to give you quality time with our providers, and arriving late affects you and other patients whose appointments are after yours.  Also, if you no show three or more times for appointments you may be dismissed from the clinic at the providers discretion.     Again, thank you for choosing [facility].  Our hope is that these requests will decrease the amount of time that you wait before being seen by our physicians.       _____________________________________________________________  Should you have questions after your visit to [facility], please contact our office at [phone] between the hours of 8:00 a.m. and 4:30 p.m.  Voicemails left after 4:00 p.m. will not be returned until the following business day.  For prescription refill requests, have your pharmacy contact our office and allow 72 hours.    Cancer Center Support Programs:   > Cancer Support Group  2nd Tuesday of the month 1pm-2pm, Journey Room

## 2020-06-30 ENCOUNTER — Ambulatory Visit (HOSPITAL_COMMUNITY)
Admission: RE | Admit: 2020-06-30 | Discharge: 2020-06-30 | Disposition: A | Discharge status: Home / Self Care | Payer: Self-pay | Source: Ambulatory Visit | Attending: Physician Assistant | Admitting: Physician Assistant

## 2020-06-30 ENCOUNTER — Other Ambulatory Visit: Payer: Self-pay

## 2020-06-30 DIAGNOSIS — Z1239 Encounter for other screening for malignant neoplasm of breast: Secondary | ICD-10-CM | POA: Insufficient documentation

## 2020-07-31 ENCOUNTER — Other Ambulatory Visit: Payer: Self-pay | Admitting: Physician Assistant

## 2020-07-31 DIAGNOSIS — R7303 Prediabetes: Secondary | ICD-10-CM

## 2020-07-31 DIAGNOSIS — E039 Hypothyroidism, unspecified: Secondary | ICD-10-CM

## 2020-07-31 DIAGNOSIS — E7800 Pure hypercholesterolemia, unspecified: Secondary | ICD-10-CM

## 2020-08-11 ENCOUNTER — Other Ambulatory Visit (HOSPITAL_COMMUNITY)
Admission: RE | Admit: 2020-08-11 | Discharge: 2020-08-11 | Disposition: A | Discharge status: Home / Self Care | Payer: Self-pay | Source: Ambulatory Visit | Attending: Physician Assistant | Admitting: Physician Assistant

## 2020-08-11 ENCOUNTER — Other Ambulatory Visit: Payer: Self-pay

## 2020-08-11 DIAGNOSIS — E7800 Pure hypercholesterolemia, unspecified: Secondary | ICD-10-CM | POA: Insufficient documentation

## 2020-08-11 DIAGNOSIS — E039 Hypothyroidism, unspecified: Secondary | ICD-10-CM | POA: Insufficient documentation

## 2020-08-11 DIAGNOSIS — R7303 Prediabetes: Secondary | ICD-10-CM | POA: Insufficient documentation

## 2020-08-11 LAB — HEMOGLOBIN A1C
Hgb A1c MFr Bld: 5.8 % — ABNORMAL HIGH (ref 4.8–5.6)
Mean Plasma Glucose: 119.76 mg/dL

## 2020-08-11 LAB — LIPID PANEL
Cholesterol: 192 mg/dL (ref 0–200)
HDL: 79 mg/dL (ref >40)
LDL Cholesterol: 106 mg/dL — ABNORMAL HIGH (ref 0–99)
Total CHOL/HDL Ratio: 2.4 RATIO
Triglycerides: 33 mg/dL (ref <150)
VLDL: 7 mg/dL (ref 0–40)

## 2020-08-11 LAB — TSH: TSH: 5.056 u[IU]/mL — ABNORMAL HIGH (ref 0.350–4.500)

## 2020-08-18 ENCOUNTER — Other Ambulatory Visit: Payer: Self-pay | Admitting: Physician Assistant

## 2020-08-18 ENCOUNTER — Encounter: Payer: Self-pay | Admitting: Physician Assistant

## 2020-08-18 ENCOUNTER — Ambulatory Visit: Payer: Self-pay | Admitting: Physician Assistant

## 2020-08-18 ENCOUNTER — Other Ambulatory Visit: Payer: Self-pay

## 2020-08-18 VITALS — BP 134/81 | HR 66 | Temp 98.1°F | Wt 147.0 lb

## 2020-08-18 DIAGNOSIS — Z1211 Encounter for screening for malignant neoplasm of colon: Secondary | ICD-10-CM

## 2020-08-18 DIAGNOSIS — Z789 Other specified health status: Secondary | ICD-10-CM

## 2020-08-18 DIAGNOSIS — E039 Hypothyroidism, unspecified: Secondary | ICD-10-CM

## 2020-08-18 DIAGNOSIS — R7303 Prediabetes: Secondary | ICD-10-CM

## 2020-08-18 MED ORDER — LEVOTHYROXINE SODIUM 50 MCG PO TABS
50.0000 ug | ORAL_TABLET | Freq: Every day | ORAL | 6 refills | Status: DC
Start: 2020-08-18 — End: 2020-11-24

## 2020-08-18 NOTE — Progress Notes (Signed)
BP 134/81   Pulse 66   Temp 98.1 F (36.7 C)   Wt 147 lb (66.7 kg)   SpO2 98%   BMI 26.89 kg/m    Subjective:    Patient ID: Heather Strickland, female    DOB: 21-Dec-1975, 45 y.o.   MRN: [id number]  HPI: Heather Strickland is a 45 y.o. female presenting on 08/18/2020 for Hypothyroidism   HPI   Pt had a negative covid 19 screening questionnarie.   Chief Complaint  Patient presents with   Hypothyroidism    Pt says sometimes she spits out blood while talking.  It happened yesterday.  The first time was about 8 months ago.  She says it has only happened 3 times.   She has No problems swallowing her food.   Pt had throat cancer.  She was Last seen by oncology in may.  She has follow up appointment in November  She is currently separated from her husband.   She says she isn't having any anxiety over the separation because it's been 4 years.      Relevant past medical, surgical, family and social history reviewed and updated as indicated. Interim medical history since our last visit reviewed. Allergies and medications reviewed and updated.     Current Outpatient Medications:    ferrous sulfate 325 (65 FE) MG tablet, Take 1 tablet (325 mg total) by mouth daily., Disp: 30 tablet, Rfl: 0   levothyroxine (SYNTHROID) 50 MCG tablet, Take 1 tablet (50 mcg total) by mouth daily. Tome una tableta por boca diaria, Disp: 30 tablet, Rfl: 4   loratadine (CLARITIN) 10 MG tablet, Take 10 mg by mouth daily as needed. , Disp: , Rfl:    Multiple Vitamins-Minerals (MULTIVITAMIN WITH MINERALS) tablet, Take 1 tablet by mouth daily., Disp: , Rfl:    hydrOXYzine (ATARAX/VISTARIL) 25 MG tablet, Take 1 tablet (25 mg total) by mouth every 12 (twelve) hours as needed for anxiety. (Patient not taking: Reported on 08/18/2020), Disp: 30 tablet, Rfl: 0   norethindrone (MICRONOR) 0.35 MG tablet, Take 1 tablet (0.35 mg total) by mouth daily. (Patient not taking: Reported on 08/18/2020), Disp: 28  tablet, Rfl: 11    Review of Systems  Per HPI unless specifically indicated above     Objective:    BP 134/81   Pulse 66   Temp 98.1 F (36.7 C)   Wt 147 lb (66.7 kg)   SpO2 98%   BMI 26.89 kg/m   Wt Readings from Last 3 Encounters:  08/18/20 147 lb (66.7 kg)  05/22/20 150 lb 6.4 oz (68.2 kg)  05/19/20 148 lb (67.1 kg)    Physical Exam Vitals reviewed.  Constitutional:      General: She is not in acute distress.    Appearance: She is well-developed. She is not toxic-appearing.  HENT:     Head: Normocephalic and atraumatic.  Cardiovascular:     Rate and Rhythm: Normal rate and regular rhythm.  Pulmonary:     Effort: Pulmonary effort is normal.     Breath sounds: Normal breath sounds.  Abdominal:     General: Bowel sounds are normal.     Palpations: Abdomen is soft. There is no mass.     Tenderness: There is no abdominal tenderness.  Musculoskeletal:     Cervical back: Neck supple.  Lymphadenopathy:     Cervical: No cervical adenopathy.  Skin:    General: Skin is warm and dry.  Neurological:     Mental Status: She is  alert and oriented to person, place, and time.  Psychiatric:        Behavior: Behavior normal.    Results for orders placed or performed during the hospital encounter of 08/11/20  Lipid panel  Result Value Ref Range   Cholesterol 192 0 - 200 mg/dL   Triglycerides 33 <150 mg/dL   HDL 79 >40 mg/dL   Total CHOL/HDL Ratio 2.4 RATIO   VLDL 7 0 - 40 mg/dL   LDL Cholesterol 106 (H) 0 - 99 mg/dL  Hemoglobin A1c  Result Value Ref Range   Hgb A1c MFr Bld 5.8 (H) 4.8 - 5.6 %   Mean Plasma Glucose 119.76 mg/dL  TSH  Result Value Ref Range   TSH 5.056 (H) 0.350 - 4.500 uIU/mL      Assessment & Plan:    Encounter Diagnoses  Name Primary?   Hypothyroidism (acquired) Yes   Not proficient in English language    Screening for colon cancer    Prediabetes      -Reviewed labs with pt -No changes to medications -pt is encouraged to Call  oncology about her infrequent episodes of spitting blood to see if she needs to be seen there sooner than november -Continue with oncology per their recommendations -Pap/mammogram UTD -pt was given FIT test -pt to follow up 6 months.  She is to contact office sooner prn

## 2020-08-25 LAB — IFOBT (OCCULT BLOOD): IFOBT: NEGATIVE

## 2020-11-21 ENCOUNTER — Other Ambulatory Visit: Payer: Self-pay

## 2020-11-21 ENCOUNTER — Inpatient Hospital Stay (HOSPITAL_COMMUNITY): Payer: Self-pay | Attending: Hematology

## 2020-11-21 ENCOUNTER — Encounter (HOSPITAL_COMMUNITY): Payer: Self-pay

## 2020-11-21 ENCOUNTER — Ambulatory Visit (HOSPITAL_COMMUNITY)
Admission: RE | Admit: 2020-11-21 | Discharge: 2020-11-21 | Disposition: A | Discharge status: Home / Self Care | Payer: Self-pay | Source: Ambulatory Visit | Attending: Hematology | Admitting: Hematology

## 2020-11-21 DIAGNOSIS — C090 Malignant neoplasm of tonsillar fossa: Secondary | ICD-10-CM | POA: Insufficient documentation

## 2020-11-21 DIAGNOSIS — Z79899 Other long term (current) drug therapy: Secondary | ICD-10-CM | POA: Insufficient documentation

## 2020-11-21 DIAGNOSIS — D649 Anemia, unspecified: Secondary | ICD-10-CM | POA: Insufficient documentation

## 2020-11-21 DIAGNOSIS — E039 Hypothyroidism, unspecified: Secondary | ICD-10-CM | POA: Insufficient documentation

## 2020-11-21 DIAGNOSIS — R1310 Dysphagia, unspecified: Secondary | ICD-10-CM | POA: Insufficient documentation

## 2020-11-21 LAB — CBC WITH DIFFERENTIAL/PLATELET
Abs Immature Granulocytes: 0 10*3/uL (ref 0.00–0.07)
Basophils Absolute: 0 10*3/uL (ref 0.0–0.1)
Basophils Relative: 1 %
Eosinophils Absolute: 0.1 10*3/uL (ref 0.0–0.5)
Eosinophils Relative: 3 %
HCT: 32.4 % — ABNORMAL LOW (ref 36.0–46.0)
Hemoglobin: 10.3 g/dL — ABNORMAL LOW (ref 12.0–15.0)
Immature Granulocytes: 0 %
Lymphocytes Relative: 23 %
Lymphs Abs: 0.8 10*3/uL (ref 0.7–4.0)
MCH: 29 pg (ref 26.0–34.0)
MCHC: 31.8 g/dL (ref 30.0–36.0)
MCV: 91.3 fL (ref 80.0–100.0)
Monocytes Absolute: 0.3 10*3/uL (ref 0.1–1.0)
Monocytes Relative: 7 %
Neutro Abs: 2.3 10*3/uL (ref 1.7–7.7)
Neutrophils Relative %: 66 %
Platelets: 267 10*3/uL (ref 150–400)
RBC: 3.55 MIL/uL — ABNORMAL LOW (ref 3.87–5.11)
RDW: 15.1 % (ref 11.5–15.5)
WBC: 3.6 10*3/uL — ABNORMAL LOW (ref 4.0–10.5)
nRBC: 0 % (ref 0.0–0.2)

## 2020-11-21 LAB — COMPREHENSIVE METABOLIC PANEL
ALT: 13 U/L (ref 0–44)
AST: 21 U/L (ref 15–41)
Albumin: 4.2 g/dL (ref 3.5–5.0)
Alkaline Phosphatase: 60 U/L (ref 38–126)
Anion gap: 7 (ref 5–15)
BUN: 11 mg/dL (ref 6–20)
CO2: 27 mmol/L (ref 22–32)
Calcium: 9.2 mg/dL (ref 8.9–10.3)
Chloride: 104 mmol/L (ref 98–111)
Creatinine, Ser: 0.5 mg/dL (ref 0.44–1.00)
GFR, Estimated: >60 mL/min (ref >60)
Glucose, Bld: 109 mg/dL — ABNORMAL HIGH (ref 70–99)
Potassium: 4.4 mmol/L (ref 3.5–5.1)
Sodium: 138 mmol/L (ref 135–145)
Total Bilirubin: 1.3 mg/dL — ABNORMAL HIGH (ref 0.3–1.2)
Total Protein: 7 g/dL (ref 6.5–8.1)

## 2020-11-21 LAB — LACTATE DEHYDROGENASE: LDH: 125 U/L (ref 98–192)

## 2020-11-21 LAB — TSH: TSH: 10.449 u[IU]/mL — ABNORMAL HIGH (ref 0.350–4.500)

## 2020-11-21 MED ORDER — IOHEXOL 300 MG/ML  SOLN
75.0000 mL | Freq: Once | INTRAMUSCULAR | Status: AC | PRN
  Administered 2020-11-21: 75 mL via INTRAVENOUS

## 2020-11-23 NOTE — Progress Notes (Signed)
[place],  [id number]   CLINIC:  Medical Oncology/Hematology  PCP:  Soyla Dryer, PA-C [address] / [place] [address] [phone]   REASON FOR VISIT:  Follow-up for cancer of the tonsillar fossa  PRIOR THERAPY: none  NGS Results: not done  CURRENT THERAPY: surveillance  BRIEF ONCOLOGIC HISTORY:  Oncology History  Cancer of tonsillar fossa ([place])  09/1926 Procedure   Gastrostomy tube removed.   01/15/2016 Procedure   Left tonsil biopsy by Dr. Benjamine Mola   01/15/2016 Initial Diagnosis   She presented to Dr. Benjamine Mola after being referred by [name], NP (GI) with a 5-6 month history of severe sore throat.  The patient never sought treatment for this issue previously.  Over the last 3 months, patient reported intermittent bleeding from left tonsil.  She also feels a hard mass along the left side of neck.  On physical exam by Dr. Benjamine Mola, he noted 3+ ulceration and erythema on the left tonsil.  On flexible laryngoscopy, tonsillar hypertrophy with ulceration was noted on the left.   01/19/2016 Pathology Results   Invasive squamous cell carcinoma, poorly differentiated.   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 Fine soft tissue details lost at these levels.   Pharyngeal mass centered in the left palatini tonsil with surround mucosal thickening as described below probably representing neoplasm.   Mucosal thickening of the left aspect of hypopharynx and pharynx with inferior most extension to the margin of the supraglottic airway without appreciable invasion into epiglottis, aryepiglottic folds, vocal cords, or paraglottic fat.   Superior most extension of mucosal thickening to the left aspect of the uvula and along the left aspect of the soft palate to the hard/soft palate junction, this region  is obscured by motion artifact.   Anteriorly there is effacement of left glossopharyngeal sulcus without appreciable tongue base invasion, this region is obscured by motion artifact.   Asymmetric enlargement of left-sided level 2 and 3 cervical lymph nodes possibly metastatic. No evidence for lymph node necrosis or extra nodal extension.   01/29/2016 Imaging   CT chest-  No evidence of metastatic disease in the chest.   02/06/2016 PET scan   1. Highly hypermetabolic left palatine tonsillar mass, maximum SUV 38.5. 2. A left station IIa lymph node measure 1.1 cm in short axis, mildly enlarged on image 38/3, but has only a borderline elevated SUV at 3.5. 3. Other imaging findings of potential clinical significance: Mild cardiomegaly. Calcified granuloma in the superior segment right lower lobe (benign). IUD satisfactorily positioned in the uterus.   02/11/2016 Procedure   Successful fluoroscopic insertion of a 20-French pull-through gastrostomy tube.   02/11/2016 Procedure   Successful placement of a right internal jugular approach power injectable Port-A-Cath. The catheter is ready for immediate use.   03/08/2016 - 04/23/2016 Radiation Therapy   Radiation alone Isidore Moos). Left Tonsil and Bilateral neck / 70 Gy in 35 fractions to gross disease, 63 Gy in 35 fractions to high risk nodal echelons, and 56 Gy in 35 fractions to intermediate risk nodal echelons   10/08/2016 Procedure   Pull-through gastrostomy tube removed.   01/12/2017 Procedure   Port removed by IR     CANCER STAGING: Cancer Staging Cancer of tonsillar fossa ([facility]) Staging form: Pharynx - HPV-Mediated Oropharynx, AJCC 8th Edition - Clinical  stage from 01/30/2016: Stage I (cT2, cN1, cM0, p16+) - Signed by Baird Cancer, PA-C on 02/18/2016 - Pathologic: No stage assigned - Unsigned - Clinical: cT3 - Unsigned   INTERVAL HISTORY:  Heather Strickland, a 45 y.o. female, returns for routine follow-up of her  cancer of the tonsillar fossa. Heather Strickland was last seen on 05/22/2020.   Today she reports feeling good, and she is accompanied by an interpreter. She reports difficulty swallowing breads and hamburgers, but she is able to swallow other solid foods without difficulty. She reports soreness in her throat, and she reports occasional hemoptysis. She reports regular menses. She has not taken iron tablets in 2 weeks as she ran out of tablets.   REVIEW OF SYSTEMS:  Review of Systems  Constitutional:  Negative for appetite change (90%) and fatigue (80%).  HENT:   Positive for sore throat (3/10) and trouble swallowing (breads).   Respiratory:  Positive for hemoptysis and shortness of breath.   Genitourinary:  Positive for difficulty urinating (urgency).   Neurological:  Positive for numbness (hands).  Psychiatric/Behavioral:  Positive for depression and sleep disturbance. The patient is nervous/anxious.   All other systems reviewed and are negative.  PAST MEDICAL/SURGICAL HISTORY:  Past Medical History:  Diagnosis Date   Anemia    GERD (gastroesophageal reflux disease)    History of radiation therapy 03/08/2016 - 04/23/2016   Left Tonsil and Bilateral Neck   HPV in female    Squamous cell carcinoma of left tonsil ([facility]) 01/27/2016   Past Surgical History:  Procedure Laterality Date   CESAREAN SECTION  2001,2007   ESOPHAGOGASTRODUODENOSCOPY N/A 11/07/2015   Dr. Gala Romney: normal esophagus, chronic gastritis    IR GASTROSTOMY TUBE REMOVAL  10/08/2016   IR GENERIC HISTORICAL  02/11/2016   IR FLUORO GUIDE PORT INSERTION RIGHT 02/11/2016 Sandi Mariscal, MD [facility]   IR GENERIC HISTORICAL  02/11/2016   IR US GUIDE VASC ACCESS RIGHT 02/11/2016 Sandi Mariscal, MD [facility]   IR GENERIC HISTORICAL  02/11/2016   IR GASTROSTOMY TUBE MOD SED 02/11/2016 Sandi Mariscal, MD [facility]   IR REMOVAL TUN ACCESS W/ PORT W/O FL MOD SED  01/12/2017   MULTIPLE EXTRACTIONS WITH ALVEOLOPLASTY N/A 02/20/2016   Procedure:  Extraction of tooth #'s 15,17,18 and 32 with alveoloplasty and dental cleaning of teeth;  Surgeon: Lenn Cal, DDS;  Location: [facility];  Service: Oral Surgery;  Laterality: N/A;    SOCIAL HISTORY:  Social History   Socioeconomic History   Marital status: Married    Spouse name: Not on file   Number of children: 4   Years of education: Not on file   Highest education level: 8th grade  Occupational History   Not on file  Tobacco Use   Smoking status: Never   Smokeless tobacco: Never  Vaping Use   Vaping Use: Never used  Substance and Sexual Activity   Alcohol use: No   Drug use: No   Sexual activity: Not Currently    Birth control/protection: I.U.D.  Other Topics Concern   Not on file  Social History Narrative   Not on file   Social Determinants of Health   Financial Resource Strain: Not on file  Food Insecurity: Not on file  Transportation Needs: Not on file  Physical Activity: Not on file  Stress: Not on file  Social Connections: Not on file  Intimate Partner Violence: Not on file    FAMILY HISTORY:  Family History  Problem Relation Age of Onset  Diabetes Mother    Hypertension Mother    Prostatitis Father    ADD / ADHD Daughter    Cerebral palsy Son     CURRENT MEDICATIONS:  Current Outpatient Medications  Medication Sig Dispense Refill   ferrous sulfate 325 (65 FE) MG tablet Take 1 tablet (325 mg total) by mouth daily. 30 tablet 0   hydrOXYzine (ATARAX/VISTARIL) 25 MG tablet Take 1 tablet (25 mg total) by mouth every 12 (twelve) hours as needed for anxiety. (Patient not taking: Reported on 08/18/2020) 30 tablet 0   levothyroxine (SYNTHROID) 50 MCG tablet Take 1 tablet (50 mcg total) by mouth daily. Tome una tableta por boca diaria 30 tablet 6   loratadine (CLARITIN) 10 MG tablet Take 10 mg by mouth daily as needed.      Multiple Vitamins-Minerals (MULTIVITAMIN WITH MINERALS) tablet Take 1 tablet by mouth daily.     norethindrone (MICRONOR) 0.35 MG  tablet Take 1 tablet (0.35 mg total) by mouth daily. (Patient not taking: Reported on 08/18/2020) 28 tablet 11   No current facility-administered medications for this visit.    ALLERGIES:  Allergies  Allergen Reactions   No Known Allergies     PHYSICAL EXAM:  Performance status (ECOG): 1 - Symptomatic but completely ambulatory  There were no vitals filed for this visit. Wt Readings from Last 3 Encounters:  08/18/20 147 lb (66.7 kg)  05/22/20 150 lb 6.4 oz (68.2 kg)  05/19/20 148 lb (67.1 kg)   Physical Exam Vitals reviewed.  Constitutional:      Appearance: Normal appearance.  HENT:     Mouth/Throat:     Mouth: Mucous membranes are moist. No oral lesions.  Cardiovascular:     Rate and Rhythm: Normal rate and regular rhythm.     Pulses: Normal pulses.     Heart sounds: Normal heart sounds.  Pulmonary:     Effort: Pulmonary effort is normal.     Breath sounds: Normal breath sounds.  Lymphadenopathy:     Cervical: No cervical adenopathy.     Right cervical: No superficial cervical adenopathy.    Left cervical: No superficial cervical adenopathy.     Upper Body:     Right upper body: No supraclavicular adenopathy.     Left upper body: No supraclavicular adenopathy.  Neurological:     General: No focal deficit present.     Mental Status: She is alert and oriented to person, place, and time.  Psychiatric:        Mood and Affect: Mood normal.        Behavior: Behavior normal.     LABORATORY DATA:  I have reviewed the labs as listed.  CBC Latest Ref Rng & Units 11/21/2020 05/15/2020 11/08/2019  WBC 4.0 - 10.5 K/uL 3.6(L) 4.7 4.7  Hemoglobin 12.0 - 15.0 g/dL 10.3(L) 11.2(L) 12.7  Hematocrit 36.0 - 46.0 % 32.4(L) 34.7(L) 38.6  Platelets 150 - 400 K/uL 267 225 225   CMP Latest Ref Rng & Units 11/21/2020 05/15/2020 11/08/2019  Glucose 70 - 99 mg/dL 109(H) 101(H) 105(H)  BUN 6 - 20 mg/dL 11 17 15   Creatinine 0.44 - 1.00 mg/dL 0.50 0.56 0.46  Sodium 135 - 145 mmol/L 138 137  135  Potassium 3.5 - 5.1 mmol/L 4.4 3.8 4.1  Chloride 98 - 111 mmol/L 104 105 103  CO2 22 - 32 mmol/L 27 25 25   Calcium 8.9 - 10.3 mg/dL 9.2 9.3 9.4  Total Protein 6.5 - 8.1 g/dL 7.0 7.0 7.2  Total Bilirubin  0.3 - 1.2 mg/dL 1.3(H) 0.6 0.6  Alkaline Phos 38 - 126 U/L 60 57 52  AST 15 - 41 U/L 21 18 16   ALT 0 - 44 U/L 13 13 14     DIAGNOSTIC IMAGING:  I have independently reviewed the scans and discussed with the patient. CT SOFT TISSUE NECK W CONTRAST  Result Date: 11/21/2020 CLINICAL DATA:  Treated left tonsil cancer. EXAM: CT NECK WITH CONTRAST TECHNIQUE: Multidetector CT imaging of the neck was performed using the standard protocol following the bolus administration of intravenous contrast. CONTRAST:  7mL OMNIPAQUE IOHEXOL 300 MG/ML  SOLN COMPARISON:  CT neck with contrast 11/08/2019. CT neck with contrast 01/29/2016. FINDINGS: Pharynx and larynx: No focal mucosal or submucosal lesions are present. The nasopharynx is clear. Soft palate and tongue base are within normal limits. No residual or recurrent mass lesion is present in the left tonsillar bed. Post treatment changes noted. Post treatment changes noted in the vallecula and aryepiglottic folds. No recurrent tumor is present. Vocal cords are midline and symmetric. Trachea is clear. Salivary glands: The submandibular and parotid glands and ducts are within normal limits. Intraparotid lymph node on the right is stable over time. Thyroid: Normal Lymph nodes: No significant cervical adenopathy is present. Stranding in the soft tissues bilaterally compatible with posttreatment change. New or enlarging nodes are present. Vascular: Thickening of the wall about the carotid arteries bilaterally is likely related to post treatment change. No focal stenosis is present. No significant calcifications are present. Limited intracranial: Within normal limits. Visualized orbits: The globes and orbits are within normal limits. Mastoids and visualized paranasal  sinuses: The paranasal sinuses and mastoid air cells are clear. Skeleton: No acute or aggressive process. Upper chest: Lung apices are clear. IMPRESSION: 1. Post treatment changes in the neck without evidence for residual or recurrent tumor. 2. No significant cervical adenopathy. Electronically Signed   By: San Morelle M.D.   On: 11/21/2020 13:03     ASSESSMENT:  1. 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 -She was treated with radiation therapy alone by Dr. Isidore Moos; completed treatment on 04/23/2016. -PET scan on 08/17/2016 showed interval resolution of the left pharyngeal mass.  No significant residual hypermetabolic activity or CT abnormality is observed in this location.  No regional adenopathy or evidence of distant metastatic spread.  5.5 x 4.2 left adnexal cyst is new and photopenic.  Follow-up pelvic sonogram is recommended 10 to 12 weeks. - Pelvic ultrasound done on 02/04/2017 showed bilateral ovaries not visualized, obscured by bowel gas.  Heterogeneous appearance of the endometrium, with poorly defined transitional zone.  OB/GYN consultation and possibly MRI of the pelvis may be considered for further evaluation. -She was seen by GYN and is following up as recommended. -PET scan done on 08/15/2017 showed no evidence of local recurrence in the pharyngeal mucosal space or metastatic disease.  No abnormal metabolic activity.  Interval resolution of the left adnexal cystic lesion. -CT neck and chest done on 03/03/2018 reviewed and showed no evidence of recurrent tumor.  RT changes noted.  No metastatic disease noted in the chest. -t CT soft tissue neck on 11/08/2019 revealing a normal CT of the neck with no recurrent mass or adenopathy.   PLAN:  1. 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  No palpable adenopathy.  No oral masses.  She has some dysphagia to  breads  and hamburger meat which is stable. - Reviewed CT soft tissue neck with contrast from level 11/2020 which showed posttreatment changes in the neck without evidence for residual or recurrent tumor.  No significant cervical adenopathy.    2.  Hypothyroidism: - She reports that she is taking Synthroid 50 mcg daily. - TSH on the most recent labs is 10.4 up from 5.05 previously. - Will increase Synthroid to 75 mcg daily. - She is seeing her primary doctor in January and will have her TSH checked. - RTC 6 months for follow-up with repeat TSH.  3.  Normocytic anemia: - Hemoglobin is 10.3 with MCV 91.  She apparently stopped taking iron tablet. - Recommend starting iron tablet daily.  We will check ferritin and iron panel at next visit.   Orders placed this encounter:  No orders of the defined types were placed in this encounter.    Derek Jack, MD [facility] [phone]   I, Thana Ates, am acting as a scribe for Dr. Derek Jack.  I, Derek Jack MD, have reviewed the above documentation for accuracy and completeness, and I agree with the above.

## 2020-11-24 ENCOUNTER — Other Ambulatory Visit: Payer: Self-pay

## 2020-11-24 ENCOUNTER — Inpatient Hospital Stay (HOSPITAL_BASED_OUTPATIENT_CLINIC_OR_DEPARTMENT_OTHER): Payer: Self-pay | Admitting: Hematology

## 2020-11-24 VITALS — BP 125/85 | HR 71 | Temp 98.9°F | Resp 18 | Wt 146.7 lb

## 2020-11-24 DIAGNOSIS — C090 Malignant neoplasm of tonsillar fossa: Secondary | ICD-10-CM

## 2020-11-24 MED ORDER — LEVOTHYROXINE SODIUM 75 MCG PO TABS
75.0000 ug | ORAL_TABLET | Freq: Every day | ORAL | 6 refills | Status: DC
Start: 2020-11-24 — End: 2021-05-28

## 2020-11-24 NOTE — Patient Instructions (Signed)
[place] at [facility] Discharge Instructions  You were seen and examined today by Dr. Delton Coombes. He reviewed your most recent labs and your iron is low and your thyroid is elevated. Dr. Delton Coombes wants you to restart the iron supplement and he increased your Synthroid dose to 75 mcg.   Please follow up as scheduled.   Thank you for choosing [place] at [facility] to provide your oncology and hematology care.  To afford each patient quality time with our provider, please arrive at least 15 minutes before your scheduled appointment time.   If you have a lab appointment with the [facility] please come in thru the Main Entrance and check in at the main information desk.  You need to re-schedule your appointment should you arrive 10 or more minutes late.  We strive to give you quality time with our providers, and arriving late affects you and other patients whose appointments are after yours.  Also, if you no show three or more times for appointments you may be dismissed from the clinic at the providers discretion.     Again, thank you for choosing [facility].  Our hope is that these requests will decrease the amount of time that you wait before being seen by our physicians.       _____________________________________________________________  Should you have questions after your visit to [facility], please contact our office at [phone] and follow the prompts.  Our office hours are 8:00 a.m. and 4:30 p.m. Monday - Friday.  Please note that voicemails left after 4:00 p.m. may not be returned until the following business day.  We are closed weekends and major holidays.  You do have access to a nurse 24-7, just call the main number to the clinic [phone] and do not press any options, hold on the line and a nurse will answer the phone.    For prescription refill requests, have your pharmacy contact our office and  allow 72 hours.    Due to Covid, you will need to wear a mask upon entering the hospital. If you do not have a mask, a mask will be given to you at the Main Entrance upon arrival. For doctor visits, patients may have 1 support person age 53 or older with them. For treatment visits, patients can not have anyone with them due to social distancing guidelines and our immunocompromised population.

## 2020-11-25 ENCOUNTER — Ambulatory Visit (HOSPITAL_COMMUNITY): Payer: Self-pay | Admitting: Hematology

## 2020-11-27 ENCOUNTER — Ambulatory Visit (HOSPITAL_COMMUNITY): Payer: Self-pay | Admitting: Hematology

## 2021-02-18 ENCOUNTER — Ambulatory Visit: Payer: Self-pay | Admitting: Physician Assistant

## 2021-05-22 ENCOUNTER — Inpatient Hospital Stay (HOSPITAL_COMMUNITY): Payer: Self-pay | Attending: Hematology

## 2021-05-22 DIAGNOSIS — Z923 Personal history of irradiation: Secondary | ICD-10-CM | POA: Insufficient documentation

## 2021-05-22 DIAGNOSIS — Z85819 Personal history of malignant neoplasm of unspecified site of lip, oral cavity, and pharynx: Secondary | ICD-10-CM | POA: Insufficient documentation

## 2021-05-22 DIAGNOSIS — E039 Hypothyroidism, unspecified: Secondary | ICD-10-CM | POA: Insufficient documentation

## 2021-05-22 DIAGNOSIS — D649 Anemia, unspecified: Secondary | ICD-10-CM | POA: Insufficient documentation

## 2021-05-25 ENCOUNTER — Other Ambulatory Visit (HOSPITAL_COMMUNITY): Payer: Self-pay

## 2021-05-25 DIAGNOSIS — E039 Hypothyroidism, unspecified: Secondary | ICD-10-CM

## 2021-05-25 DIAGNOSIS — C090 Malignant neoplasm of tonsillar fossa: Secondary | ICD-10-CM

## 2021-05-26 ENCOUNTER — Other Ambulatory Visit (HOSPITAL_COMMUNITY)
Admission: RE | Admit: 2021-05-26 | Discharge: 2021-05-26 | Disposition: A | Discharge status: Home / Self Care | Payer: Self-pay | Source: Ambulatory Visit | Attending: Physician Assistant | Admitting: Physician Assistant

## 2021-05-26 ENCOUNTER — Inpatient Hospital Stay (HOSPITAL_COMMUNITY): Payer: Self-pay

## 2021-05-26 DIAGNOSIS — E039 Hypothyroidism, unspecified: Secondary | ICD-10-CM | POA: Insufficient documentation

## 2021-05-26 DIAGNOSIS — C090 Malignant neoplasm of tonsillar fossa: Secondary | ICD-10-CM | POA: Insufficient documentation

## 2021-05-26 LAB — CBC WITH DIFFERENTIAL/PLATELET
Abs Immature Granulocytes: 0.01 10*3/uL (ref 0.00–0.07)
Basophils Absolute: 0.1 10*3/uL (ref 0.0–0.1)
Basophils Relative: 1 %
Eosinophils Absolute: 0.5 10*3/uL (ref 0.0–0.5)
Eosinophils Relative: 10 %
HCT: 33.8 % — ABNORMAL LOW (ref 36.0–46.0)
Hemoglobin: 10.7 g/dL — ABNORMAL LOW (ref 12.0–15.0)
Immature Granulocytes: 0 %
Lymphocytes Relative: 19 %
Lymphs Abs: 0.8 10*3/uL (ref 0.7–4.0)
MCH: 29.2 pg (ref 26.0–34.0)
MCHC: 31.7 g/dL (ref 30.0–36.0)
MCV: 92.3 fL (ref 80.0–100.0)
Monocytes Absolute: 0.4 10*3/uL (ref 0.1–1.0)
Monocytes Relative: 8 %
Neutro Abs: 2.7 10*3/uL (ref 1.7–7.7)
Neutrophils Relative %: 62 %
Platelets: 235 10*3/uL (ref 150–400)
RBC: 3.66 MIL/uL — ABNORMAL LOW (ref 3.87–5.11)
RDW: 14.7 % (ref 11.5–15.5)
WBC: 4.4 10*3/uL (ref 4.0–10.5)
nRBC: 0 % (ref 0.0–0.2)

## 2021-05-26 LAB — COMPREHENSIVE METABOLIC PANEL
ALT: 15 U/L (ref 0–44)
AST: 18 U/L (ref 15–41)
Albumin: 4.1 g/dL (ref 3.5–5.0)
Alkaline Phosphatase: 61 U/L (ref 38–126)
Anion gap: 4 — ABNORMAL LOW (ref 5–15)
BUN: 14 mg/dL (ref 6–20)
CO2: 24 mmol/L (ref 22–32)
Calcium: 8.8 mg/dL — ABNORMAL LOW (ref 8.9–10.3)
Chloride: 109 mmol/L (ref 98–111)
Creatinine, Ser: 0.58 mg/dL (ref 0.44–1.00)
GFR, Estimated: >60 mL/min (ref >60)
Glucose, Bld: 116 mg/dL — ABNORMAL HIGH (ref 70–99)
Potassium: 4.1 mmol/L (ref 3.5–5.1)
Sodium: 137 mmol/L (ref 135–145)
Total Bilirubin: 0.4 mg/dL (ref 0.3–1.2)
Total Protein: 6.8 g/dL (ref 6.5–8.1)

## 2021-05-26 LAB — FERRITIN: Ferritin: 8 ng/mL — ABNORMAL LOW (ref 11–307)

## 2021-05-26 LAB — IRON AND TIBC
Iron: 49 ug/dL (ref 28–170)
Saturation Ratios: 12 % (ref 10.4–31.8)
TIBC: 398 ug/dL (ref 250–450)
UIBC: 349 ug/dL

## 2021-05-26 LAB — TSH: TSH: 4.932 u[IU]/mL — ABNORMAL HIGH (ref 0.350–4.500)

## 2021-05-28 ENCOUNTER — Inpatient Hospital Stay (HOSPITAL_BASED_OUTPATIENT_CLINIC_OR_DEPARTMENT_OTHER): Payer: Self-pay | Admitting: Hematology

## 2021-05-28 VITALS — BP 130/84 | HR 74 | Temp 97.5°F | Resp 16 | Wt 153.9 lb

## 2021-05-28 DIAGNOSIS — C090 Malignant neoplasm of tonsillar fossa: Secondary | ICD-10-CM

## 2021-05-28 NOTE — Progress Notes (Signed)
Heather Strickland, [place] [address]   CLINIC:  Medical Oncology/Hematology  PCP:  Soyla Dryer, PA-C [address] / [place] [address] [phone]   REASON FOR VISIT:  Follow-up for cancer of the tonsillar fossa  PRIOR THERAPY: none  NGS Results: not done  CURRENT THERAPY: surveillance  BRIEF ONCOLOGIC HISTORY:  Oncology History  Cancer of tonsillar fossa ([facility] [place])  09/1926 Procedure   Gastrostomy tube removed.    01/15/2016 Procedure   Left tonsil biopsy by Dr. Benjamine Mola    01/15/2016 Initial Diagnosis   She presented to Dr. Benjamine Mola after being referred by [name], NP (GI) with a 5-6 month history of severe sore throat.  The patient never sought treatment for this issue previously.  Over the last 3 months, patient reported intermittent bleeding from left tonsil.  She also feels a hard mass along the left side of neck.  On physical exam by Dr. Benjamine Mola, he noted 3+ ulceration and erythema on the left tonsil.  On flexible laryngoscopy, tonsillar hypertrophy with ulceration was noted on the left.    01/19/2016 Pathology Results   Invasive squamous cell carcinoma, poorly differentiated.    01/20/2016 Pathology Results   Tumor cells are POSITIVE for p16 stains (HPV positive)    01/29/2016 Imaging   CT neck-  Motion artifact at the level of oropharynx and base of tongue. Fine soft tissue details lost at these levels.   Pharyngeal mass centered in the left palatini tonsil with surround mucosal thickening as described below probably representing neoplasm.   Mucosal thickening of the left aspect of hypopharynx and pharynx with inferior most extension to the margin of the supraglottic airway without appreciable invasion into epiglottis, aryepiglottic folds, vocal cords, or paraglottic fat.   Superior most extension of mucosal thickening to the left aspect of the uvula and along the left aspect of the soft palate to the hard/soft palate junction,  this region is obscured by motion artifact.   Anteriorly there is effacement of left glossopharyngeal sulcus without appreciable tongue base invasion, this region is obscured by motion artifact.   Asymmetric enlargement of left-sided level 2 and 3 cervical lymph nodes possibly metastatic. No evidence for lymph node necrosis or extra nodal extension.   01/29/2016 Imaging   CT chest-  No evidence of metastatic disease in the chest.    02/06/2016 PET scan   1. Highly hypermetabolic left palatine tonsillar mass, maximum SUV 38.5. 2. A left station IIa lymph node measure 1.1 cm in short axis, mildly enlarged on image 38/3, but has only a borderline elevated SUV at 3.5. 3. Other imaging findings of potential clinical significance: Mild cardiomegaly. Calcified granuloma in the superior segment right lower lobe (benign). IUD satisfactorily positioned in the uterus.    02/11/2016 Procedure   Successful fluoroscopic insertion of a 20-French pull-through gastrostomy tube.    02/11/2016 Procedure   Successful placement of a right internal jugular approach power injectable Port-A-Cath. The catheter is ready for immediate use.    03/08/2016 - 04/23/2016 Radiation Therapy   Radiation alone Isidore Moos). Left Tonsil and Bilateral neck / 70 Gy in 35 fractions to gross disease, 63 Gy in 35 fractions to high risk nodal echelons, and 56 Gy in 35 fractions to intermediate risk nodal echelons    10/08/2016 Procedure   Pull-through gastrostomy tube removed.    01/12/2017 Procedure   Port removed by IR      CANCER STAGING:  Cancer Staging  Cancer of  tonsillar fossa (HCC) Staging form: Pharynx - HPV-Mediated Oropharynx, AJCC 8th Edition - Clinical stage from 01/30/2016: Stage I (cT2, cN1, cM0, p16+) - Signed by Baird Cancer, PA-C on 02/18/2016 - Pathologic: No stage assigned - Unsigned - Clinical: cT3 - Unsigned   INTERVAL HISTORY:  [name], a 46 y.o. female, returns for  routine follow-up of her cancer of the tonsillar fossa. Heather Strickland was last seen on 11/24/2020.   Today she reports feeling good. She reports she continues to have trouble swallowing tough foods such as breads and hamburgers. She continues to take 1 iron tablet daily, but she admits she occasionally will forget to take it. She denies constipation. She reports her menstrual bleeding is at baseline.   REVIEW OF SYSTEMS:  Review of Systems  Constitutional:  Negative for appetite change and fatigue.  HENT:   Positive for trouble swallowing.   Gastrointestinal:  Negative for constipation.  Genitourinary:  Negative for menstrual problem.   Musculoskeletal:  Positive for arthralgias (7/10 hips).  Neurological:  Positive for dizziness, headaches (allergies) and numbness (hands).  Psychiatric/Behavioral:  Positive for sleep disturbance.   All other systems reviewed and are negative.  PAST MEDICAL/SURGICAL HISTORY:  Past Medical History:  Diagnosis Date   Anemia    GERD (gastroesophageal reflux disease)    History of radiation therapy 03/08/2016 - 04/23/2016   Left Tonsil and Bilateral Neck   HPV in female    Squamous cell carcinoma of left tonsil ([facility]) 01/27/2016   Past Surgical History:  Procedure Laterality Date   CESAREAN SECTION  2001,2007   ESOPHAGOGASTRODUODENOSCOPY N/A 11/07/2015   Dr. Gala Romney: normal esophagus, chronic gastritis    IR GASTROSTOMY TUBE REMOVAL  10/08/2016   IR GENERIC HISTORICAL  02/11/2016   IR FLUORO GUIDE PORT INSERTION RIGHT 02/11/2016 Sandi Mariscal, MD [facility]   IR GENERIC HISTORICAL  02/11/2016   IR US GUIDE VASC ACCESS RIGHT 02/11/2016 Sandi Mariscal, MD [facility]   IR GENERIC HISTORICAL  02/11/2016   IR GASTROSTOMY TUBE MOD SED 02/11/2016 Sandi Mariscal, MD [facility]   IR REMOVAL TUN ACCESS W/ PORT W/O FL MOD SED  01/12/2017   MULTIPLE EXTRACTIONS WITH ALVEOLOPLASTY N/A 02/20/2016   Procedure: Extraction of tooth #'s 15,17,18 and 32 with alveoloplasty and dental  cleaning of teeth;  Surgeon: Lenn Cal, DDS;  Location: [facility];  Service: Oral Surgery;  Laterality: N/A;    SOCIAL HISTORY:  Social History   Socioeconomic History   Marital status: Married    Spouse name: Not on file   Number of children: 4   Years of education: Not on file   Highest education level: 8th grade  Occupational History   Not on file  Tobacco Use   Smoking status: Never   Smokeless tobacco: Never  Vaping Use   Vaping Use: Never used  Substance and Sexual Activity   Alcohol use: No   Drug use: No   Sexual activity: Not Currently    Birth control/protection: I.U.D.  Other Topics Concern   Not on file  Social History Narrative   Not on file   Social Determinants of Health   Financial Resource Strain: Not on file  Food Insecurity: Not on file  Transportation Needs: Not on file  Physical Activity: Not on file  Stress: Not on file  Social Connections: Not on file  Intimate Partner Violence: Not on file    FAMILY HISTORY:  Family History  Problem Relation Age of Onset   Diabetes Mother  Hypertension Mother    Prostatitis Father    ADD / ADHD Daughter    Cerebral palsy Son     CURRENT MEDICATIONS:  Current Outpatient Medications  Medication Sig Dispense Refill   ferrous sulfate 325 (65 FE) MG tablet Take 1 tablet (325 mg total) by mouth daily. 30 tablet 0   hydrOXYzine (ATARAX/VISTARIL) 25 MG tablet Take 1 tablet (25 mg total) by mouth every 12 (twelve) hours as needed for anxiety. 30 tablet 0   levothyroxine (SYNTHROID) 50 MCG tablet Take 50 mcg by mouth daily.     loratadine (CLARITIN) 10 MG tablet Take 10 mg by mouth daily as needed.      Multiple Vitamins-Minerals (MULTIVITAMIN WITH MINERALS) tablet Take 1 tablet by mouth daily.     norethindrone (MICRONOR) 0.35 MG tablet Take 1 tablet (0.35 mg total) by mouth daily. 28 tablet 11   No current facility-administered medications for this visit.    ALLERGIES:  Allergies  Allergen  Reactions   No Known Allergies     PHYSICAL EXAM:  Performance status (ECOG): 1 - Symptomatic but completely ambulatory  Vitals:   05/28/21 1140  BP: 130/84  Pulse: 74  Resp: 16  Temp: (!) 97.5 F (36.4 C)  SpO2: 99%   Wt Readings from Last 3 Encounters:  05/28/21 153 lb 14.1 oz (69.8 kg)  11/24/20 146 lb 11.2 oz (66.5 kg)  08/18/20 147 lb (66.7 kg)   Physical Exam Vitals reviewed.  Constitutional:      Appearance: Normal appearance.  HENT:     Mouth/Throat:     Mouth: No oral lesions.  Cardiovascular:     Rate and Rhythm: Normal rate and regular rhythm.     Pulses: Normal pulses.     Heart sounds: Normal heart sounds.  Pulmonary:     Effort: Pulmonary effort is normal.     Breath sounds: Normal breath sounds.  Lymphadenopathy:     Cervical: No cervical adenopathy.     Right cervical: No superficial, deep or posterior cervical adenopathy.    Left cervical: No superficial, deep or posterior cervical adenopathy.  Neurological:     General: No focal deficit present.     Mental Status: She is alert and oriented to person, place, and time.  Psychiatric:        Mood and Affect: Mood normal.        Behavior: Behavior normal.     LABORATORY DATA:  I have reviewed the labs as listed.     Latest Ref Rng & Units 05/26/2021    9:16 AM 11/21/2020    9:08 AM 05/15/2020    3:44 PM  CBC  WBC 4.0 - 10.5 K/uL 4.4   3.6   4.7    Hemoglobin 12.0 - 15.0 g/dL 10.7   10.3   11.2    Hematocrit 36.0 - 46.0 % 33.8   32.4   34.7    Platelets 150 - 400 K/uL 235   267   225        Latest Ref Rng & Units 05/26/2021    9:16 AM 11/21/2020    9:08 AM 05/15/2020    3:44 PM  CMP  Glucose 70 - 99 mg/dL 116   109   101    BUN 6 - 20 mg/dL '14   11   17    '$ Creatinine 0.44 - 1.00 mg/dL 0.58   0.50   0.56    Sodium 135 - 145 mmol/L 137   138  137    Potassium 3.5 - 5.1 mmol/L 4.1   4.4   3.8    Chloride 98 - 111 mmol/L 109   104   105    CO2 22 - 32 mmol/L '24   27   25    '$ Calcium 8.9 -  10.3 mg/dL 8.8   9.2   9.3    Total Protein 6.5 - 8.1 g/dL 6.8   7.0   7.0    Total Bilirubin 0.3 - 1.2 mg/dL 0.4   1.3   0.6    Alkaline Phos 38 - 126 U/L 61   60   57    AST 15 - 41 U/L '18   21   18    '$ ALT 0 - 44 U/L '15   13   13      '$ DIAGNOSTIC IMAGING:  I have independently reviewed the scans and discussed with the patient. No results found.   ASSESSMENT:  1. 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 -She was treated with radiation therapy alone by Dr. Isidore Moos; completed treatment on 04/23/2016. -PET scan on 08/17/2016 showed interval resolution of the left pharyngeal mass.  No significant residual hypermetabolic activity or CT abnormality is observed in this location.  No regional adenopathy or evidence of distant metastatic spread.  5.5 x 4.2 left adnexal cyst is new and photopenic.  Follow-up pelvic sonogram is recommended 10 to 12 weeks. - Pelvic ultrasound done on 02/04/2017 showed bilateral ovaries not visualized, obscured by bowel gas.  Heterogeneous appearance of the endometrium, with poorly defined transitional zone.  OB/GYN consultation and possibly MRI of the pelvis may be considered for further evaluation. -She was seen by GYN and is following up as recommended. -PET scan done on 08/15/2017 showed no evidence of local recurrence in the pharyngeal mucosal space or metastatic disease.  No abnormal metabolic activity.  Interval resolution of the left adnexal cystic lesion. -CT neck and chest done on 03/03/2018 reviewed and showed no evidence of recurrent tumor.  RT changes noted.  No metastatic disease noted in the chest. -t CT soft tissue neck on 11/08/2019 revealing a normal CT of the neck with no recurrent mass or adenopathy.   PLAN:  1. 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  No palpable adenopathy.  Dysphagia to certain foods like breads and hamburger is stable. -  She has completed 5 years since radiation therapy. - Reviewed her labs today which are stable.  Last imaging of the neck was in November 2022. - No further imaging necessary unless clinical condition dictates.  RTC 1 year for follow-up.    2.  Hypothyroidism: - Continue Synthroid 50 mcg daily.  TSH today is 4.9.  3.  Normocytic anemia: - Hemoglobin is 10.7.  Menstrual bleeding is mild to moderate.  Ferritin is 8 and percent saturation 12. - Continue iron tablet daily.   Orders placed this encounter:  No orders of the defined types were placed in this encounter.    Derek Jack, MD [place] [phone]   I, Thana Ates, am acting as a scribe for Dr. Derek Jack.  I, Derek Jack MD, have reviewed the above documentation for accuracy and completeness, and I agree with the above.

## 2021-05-28 NOTE — Patient Instructions (Signed)
Jessie at [facility] Discharge Instructions   You were seen and examined today by Dr. Delton Coombes.  He reviewed your lab work. Your hemoglobin is 10.7 today. Continue iron tablet daily.   Continue Synthroid 50 mcg daily.   Return as scheduled in 1 year.    Thank you for choosing [facility] at [facility] to provide your oncology and hematology care.  To afford each patient quality time with our provider, please arrive at least 15 minutes before your scheduled appointment time.   If you have a lab appointment with the [facility] please come in thru the Main Entrance and check in at the main information desk.  You need to re-schedule your appointment should you arrive 10 or more minutes late.  We strive to give you quality time with our providers, and arriving late affects you and other patients whose appointments are after yours.  Also, if you no show three or more times for appointments you may be dismissed from the clinic at the providers discretion.     Again, thank you for choosing [facility].  Our hope is that these requests will decrease the amount of time that you wait before being seen by our physicians.       _____________________________________________________________  Should you have questions after your visit to [facility], please contact our office at [phone] and follow the prompts.  Our office hours are 8:00 a.m. and 4:30 p.m. Monday - Friday.  Please note that voicemails left after 4:00 p.m. may not be returned until the following business day.  We are closed weekends and major holidays.  You do have access to a nurse 24-7, just call the main number to the clinic [phone] and do not press any options, hold on the line and a nurse will answer the phone.    For prescription refill requests, have your pharmacy contact our office and allow 72 hours.    Due to Covid, you will need to wear a  mask upon entering the hospital. If you do not have a mask, a mask will be given to you at the Main Entrance upon arrival. For doctor visits, patients may have 1 support person age 25 or older with them. For treatment visits, patients can not have anyone with them due to social distancing guidelines and our immunocompromised population.

## 2021-09-07 ENCOUNTER — Other Ambulatory Visit: Payer: Self-pay | Admitting: Physician Assistant

## 2021-12-28 ENCOUNTER — Other Ambulatory Visit (HOSPITAL_COMMUNITY): Payer: Self-pay | Admitting: Hematology

## 2021-12-29 ENCOUNTER — Other Ambulatory Visit: Payer: Self-pay | Admitting: *Deleted

## 2021-12-30 ENCOUNTER — Other Ambulatory Visit: Payer: Self-pay | Admitting: *Deleted

## 2021-12-30 MED ORDER — LEVOTHYROXINE SODIUM 50 MCG PO TABS
50.0000 ug | ORAL_TABLET | Freq: Every day | ORAL | 6 refills | Status: DC
Start: 2021-12-30 — End: 2022-04-08

## 2022-02-09 ENCOUNTER — Other Ambulatory Visit (HOSPITAL_COMMUNITY): Payer: Self-pay | Admitting: Nurse Practitioner

## 2022-02-09 DIAGNOSIS — N920 Excessive and frequent menstruation with regular cycle: Secondary | ICD-10-CM

## 2022-02-17 ENCOUNTER — Ambulatory Visit (HOSPITAL_COMMUNITY)
Admission: RE | Admit: 2022-02-17 | Discharge: 2022-02-17 | Disposition: A | Discharge status: Home / Self Care | Payer: Self-pay | Source: Ambulatory Visit | Attending: Nurse Practitioner | Admitting: Nurse Practitioner

## 2022-02-17 DIAGNOSIS — N920 Excessive and frequent menstruation with regular cycle: Secondary | ICD-10-CM | POA: Insufficient documentation

## 2022-02-24 ENCOUNTER — Other Ambulatory Visit: Payer: Self-pay | Admitting: Nurse Practitioner

## 2022-02-24 DIAGNOSIS — Z1231 Encounter for screening mammogram for malignant neoplasm of breast: Secondary | ICD-10-CM

## 2022-03-10 ENCOUNTER — Encounter (HOSPITAL_COMMUNITY): Payer: Self-pay

## 2022-03-10 ENCOUNTER — Ambulatory Visit (HOSPITAL_COMMUNITY)
Admission: RE | Admit: 2022-03-10 | Discharge: 2022-03-10 | Disposition: A | Discharge status: Home / Self Care | Payer: Self-pay | Source: Ambulatory Visit | Attending: Nurse Practitioner | Admitting: Nurse Practitioner

## 2022-03-10 DIAGNOSIS — Z1231 Encounter for screening mammogram for malignant neoplasm of breast: Secondary | ICD-10-CM

## 2022-04-08 ENCOUNTER — Inpatient Hospital Stay: Payer: Self-pay | Attending: Physician Assistant | Admitting: Oncology

## 2022-04-08 VITALS — BP 127/91 | HR 83 | Temp 98.1°F | Resp 16 | Wt 156.5 lb

## 2022-04-08 DIAGNOSIS — R221 Localized swelling, mass and lump, neck: Secondary | ICD-10-CM

## 2022-04-08 DIAGNOSIS — C090 Malignant neoplasm of tonsillar fossa: Secondary | ICD-10-CM

## 2022-04-08 DIAGNOSIS — Z923 Personal history of irradiation: Secondary | ICD-10-CM | POA: Insufficient documentation

## 2022-04-08 DIAGNOSIS — Z85818 Personal history of malignant neoplasm of other sites of lip, oral cavity, and pharynx: Secondary | ICD-10-CM | POA: Insufficient documentation

## 2022-04-08 NOTE — Progress Notes (Signed)
[place], [place] [address]   CLINIC:  Medical Oncology/Hematology  PCP:  Heather Ganja, NP [place] / [place] [id number] [phone]   REASON FOR VISIT:  Follow-up for Strickland of the tonsillar fossa  PRIOR THERAPY: none  NGS Results: not done  CURRENT THERAPY: surveillance  BRIEF ONCOLOGIC HISTORY:  Oncology History  Strickland of tonsillar fossa  09/1926 Procedure   Gastrostomy tube removed.   01/15/2016 Procedure   Left tonsil biopsy by Dr. Benjamine Strickland   01/15/2016 Initial Diagnosis   She presented to Dr. Benjamine Strickland after being referred by Heather Kaufman, NP (GI) with a 5-6 month history of severe sore throat.  The patient never sought treatment for this issue previously.  Over the last 3 months, patient reported intermittent bleeding from left tonsil.  She also feels a hard mass along the left side of neck.  On physical exam by Dr. Benjamine Strickland, he noted 3+ ulceration and erythema on the left tonsil.  On flexible laryngoscopy, tonsillar hypertrophy with ulceration was noted on the left.   01/19/2016 Pathology Results   Invasive squamous cell carcinoma, poorly differentiated.   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 Fine soft tissue details lost at these levels.   Pharyngeal mass centered in the left palatini tonsil with surround mucosal thickening as described below probably representing neoplasm.   Mucosal thickening of the left aspect of hypopharynx and pharynx with inferior most extension to the margin of the supraglottic airway without appreciable invasion into epiglottis, aryepiglottic folds, vocal cords, or paraglottic fat.   Superior most extension of mucosal thickening to the left aspect of the uvula and along the left aspect of the soft palate to the hard/soft palate junction, this region is obscured by  motion artifact.   Anteriorly there is effacement of left glossopharyngeal sulcus without appreciable tongue base invasion, this region is obscured by motion artifact.   Asymmetric enlargement of left-sided level 2 and 3 cervical lymph nodes possibly metastatic. No evidence for lymph node necrosis or extra nodal extension.   01/29/2016 Imaging   CT chest-  No evidence of metastatic disease in the chest.   02/06/2016 PET scan   1. Highly hypermetabolic left palatine tonsillar mass, maximum SUV 38.5. 2. A left station IIa lymph node measure 1.1 cm in short axis, mildly enlarged on image 38/3, but has only a borderline elevated SUV at 3.5. 3. Other imaging findings of potential clinical significance: Mild cardiomegaly. Calcified granuloma in the superior segment right lower lobe (benign). IUD satisfactorily positioned in the uterus.   02/11/2016 Procedure   Successful fluoroscopic insertion of a 20-French pull-through gastrostomy tube.   02/11/2016 Procedure   Successful placement of a right internal jugular approach power injectable Port-A-Cath. The catheter is ready for immediate use.   03/08/2016 - 04/23/2016 Radiation Therapy   Radiation alone Heather Strickland). Left Tonsil and Bilateral neck / 70 Gy in 35 fractions to gross disease, 63 Gy in 35 fractions to high risk nodal echelons, and 56 Gy in 35 fractions to intermediate risk nodal echelons   10/08/2016 Procedure   Pull-through gastrostomy tube removed.   01/12/2017 Procedure   Port removed by IR     Strickland STAGING:  Strickland Staging  Strickland of tonsillar fossa Staging form: Pharynx - HPV-Mediated Oropharynx, AJCC 8th Edition - Clinical  stage from 01/30/2016: Stage I (cT2, cN1, cM0, p16+) - Signed by Heather Cancer, PA-C on 02/18/2016 - Pathologic: No stage assigned - Unsigned - Clinical: cT3 - Unsigned   INTERVAL HISTORY:  [name], a 47 y.o. female, returns for concerns of right jaw pain that started a few  months ago.  Heather Strickland was last seen on 05/28/21.  Today she reports right sided neck swelling and pain that she discovered about 2 months ago.  States she feels a small "lump" to her right jaw.  Pain is intermittent.  Nothing makes it worse or better.  Does not feel like her face is swollen.  No pain when turning neck.  Feels different from her initial diagnosis.  No other lumps or bumps.  Continues to have trouble  swallowing foods such as bread since chemoradiation.  States she tastes very little of her food.  Has not had imaging in over a year.  No fevers, unintentional weight loss or changes in appetite.  No night sweats.  REVIEW OF SYSTEMS:  Review of Systems  Constitutional:  Positive for appetite change.  HENT:   Positive for trouble swallowing.   Musculoskeletal:  Negative for myalgias.  Hematological:  Positive for adenopathy (Right neck).    PAST MEDICAL/SURGICAL HISTORY:  Past Medical History:  Diagnosis Date   Anemia    GERD (gastroesophageal reflux disease)    History of radiation therapy 03/08/2016 - 04/23/2016   Left Tonsil and Bilateral Neck   HPV in female    Squamous cell carcinoma of left tonsil (Heather Strickland) 01/27/2016   Past Surgical History:  Procedure Laterality Date   CESAREAN SECTION  2001,2007   ESOPHAGOGASTRODUODENOSCOPY N/A 11/07/2015   Dr. Gala Strickland: normal esophagus, chronic gastritis    IR GASTROSTOMY TUBE REMOVAL  10/08/2016   IR GENERIC HISTORICAL  02/11/2016   IR FLUORO GUIDE PORT INSERTION RIGHT 02/11/2016 Heather Mariscal, MD [facility]   IR GENERIC HISTORICAL  02/11/2016   IR US GUIDE VASC ACCESS RIGHT 02/11/2016 Heather Mariscal, MD [facility]   IR GENERIC HISTORICAL  02/11/2016   IR GASTROSTOMY TUBE MOD SED 02/11/2016 Heather Mariscal, MD [facility]   IR REMOVAL TUN ACCESS W/ PORT W/O FL MOD SED  01/12/2017   MULTIPLE EXTRACTIONS WITH ALVEOLOPLASTY N/A 02/20/2016   Procedure: Extraction of tooth #'s 15,17,18 and 32 with alveoloplasty and dental cleaning of teeth;  Surgeon:  Heather Strickland, DDS;  Location: [facility];  Service: Oral Surgery;  Laterality: N/A;    SOCIAL HISTORY:  Social History   Socioeconomic History   Marital status: Married    Spouse name: Not on file   Number of children: 4   Years of education: Not on file   Highest education level: 8th grade  Occupational History   Not on file  Tobacco Use   Smoking status: Never   Smokeless tobacco: Never  Vaping Use   Vaping Use: Never used  Substance and Sexual Activity   Alcohol use: No   Drug use: No   Sexual activity: Not Currently    Birth control/protection: I.U.D.  Other Topics Concern   Not on file  Social History Narrative   Not on file   Social Determinants of Health   Financial Resource Strain: Medium Risk (11/19/2019)   Overall Financial Resource Strain (CARDIA)    Difficulty of Paying Living Expenses: Somewhat hard  Food Insecurity: No Food Insecurity (11/19/2019)   Hunger Vital Sign    Worried About Running Out of Food in the  Last Year: Never true    Martha in the Last Year: Never true  Transportation Needs: No Transportation Needs (11/19/2019)   PRAPARE - Hydrologist (Medical): No    Lack of Transportation (Non-Medical): No  Physical Activity: Insufficiently Active (11/19/2019)   Exercise Vital Sign    Days of Exercise per Week: 1 day    Minutes of Exercise per Session: 30 min  Stress: No Stress Concern Present (11/19/2019)   [place]    Feeling of Stress : Only a little  Social Connections: Moderately Isolated (11/19/2019)   Social Connection and Isolation Panel [NHANES]    Frequency of Communication with Friends and Family: More than three times a week    Frequency of Social Gatherings with Friends and Family: Never    Attends Religious Services: More than 4 times per year    Active Member of [employer] or Organizations: No    Attends Archivist Meetings:  Never    Marital Status: Separated  Intimate Partner Violence: Not At Risk (11/19/2019)   Humiliation, Afraid, Rape, and Kick questionnaire    Fear of Current or Ex-Partner: No    Emotionally Abused: No    Physically Abused: No    Sexually Abused: No    FAMILY HISTORY:  Family History  Problem Relation Age of Onset   Diabetes Mother    Hypertension Mother    Prostatitis Father    ADD / ADHD Daughter    Cerebral palsy Son     CURRENT MEDICATIONS:  Current Outpatient Medications  Medication Sig Dispense Refill   ferrous sulfate 325 (65 FE) MG tablet Take 1 tablet (325 mg total) by mouth daily. 30 tablet 0   hydrOXYzine (ATARAX/VISTARIL) 25 MG tablet Take 1 tablet (25 mg total) by mouth every 12 (twelve) hours as needed for anxiety. 30 tablet 0   levothyroxine (SYNTHROID) 75 MCG tablet Take 75 mcg by mouth daily.     loratadine (CLARITIN) 10 MG tablet Take 10 mg by mouth daily as needed.      Multiple Vitamins-Minerals (MULTIVITAMIN WITH MINERALS) tablet Take 1 tablet by mouth daily.     norethindrone (MICRONOR) 0.35 MG tablet Take 1 tablet (0.35 mg total) by mouth daily. (Patient not taking: Reported on 04/08/2022) 28 tablet 11   No current facility-administered medications for this visit.    ALLERGIES:  Allergies  Allergen Reactions   No Known Allergies     PHYSICAL EXAM:  Performance status (ECOG): 1 - Symptomatic but completely ambulatory  Vitals:   04/08/22 1427  BP: (!) 127/91  Pulse: 83  Resp: 16  Temp: 98.1 F (36.7 C)  SpO2: 99%   Wt Readings from Last 3 Encounters:  04/08/22 156 lb 8.4 oz (71 kg)  05/28/21 153 lb 14.1 oz (69.8 kg)  11/24/20 146 lb 11.2 oz (66.5 kg)   Physical Exam Vitals reviewed.  Constitutional:      Appearance: Normal appearance.  HENT:     Mouth/Throat:     Mouth: No oral lesions.  Cardiovascular:     Rate and Rhythm: Normal rate and regular rhythm.     Pulses: Normal pulses.     Heart sounds: Normal heart sounds.   Pulmonary:     Effort: Pulmonary effort is normal.     Breath sounds: Normal breath sounds.  Lymphadenopathy:     Cervical:     Right cervical: No superficial, deep or posterior  cervical adenopathy.    Left cervical: No superficial, deep or posterior cervical adenopathy.     Comments: To right parotid gland. Small pea sized non moveable mass.   Neurological:     General: No focal deficit present.     Mental Status: She is alert and oriented to person, place, and time.  Psychiatric:        Mood and Affect: Mood normal.        Behavior: Behavior normal.      LABORATORY DATA:  I have reviewed the labs as listed.     Latest Ref Rng & Units 05/26/2021    9:16 AM 11/21/2020    9:08 AM 05/15/2020    3:44 PM  CBC  WBC 4.0 - 10.5 K/uL 4.4  3.6  4.7   Hemoglobin 12.0 - 15.0 g/dL 10.7  10.3  11.2   Hematocrit 36.0 - 46.0 % 33.8  32.4  34.7   Platelets 150 - 400 K/uL 235  267  225       Latest Ref Rng & Units 05/26/2021    9:16 AM 11/21/2020    9:08 AM 05/15/2020    3:44 PM  CMP  Glucose 70 - 99 mg/dL 116  109  101   BUN 6 - 20 mg/dL 14  11  17    Creatinine 0.44 - 1.00 mg/dL 0.58  0.50  0.56   Sodium 135 - 145 mmol/L 137  138  137   Potassium 3.5 - 5.1 mmol/L 4.1  4.4  3.8   Chloride 98 - 111 mmol/L 109  104  105   CO2 22 - 32 mmol/L 24  27  25    Calcium 8.9 - 10.3 mg/dL 8.8  9.2  9.3   Total Protein 6.5 - 8.1 g/dL 6.8  7.0  7.0   Total Bilirubin 0.3 - 1.2 mg/dL 0.4  1.3  0.6   Alkaline Phos 38 - 126 U/L 61  60  57   AST 15 - 41 U/L 18  21  18    ALT 0 - 44 U/L 15  13  13      DIAGNOSTIC IMAGING:  I have independently reviewed the scans and discussed with the patient. No results found.   ASSESSMENT:  1. 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 -She was treated with radiation therapy alone by Dr. Isidore Strickland; completed treatment on 04/23/2016. -PET scan on 08/17/2016 showed interval resolution of the left pharyngeal mass.  No  significant residual hypermetabolic activity or CT abnormality is observed in this location.  No regional adenopathy or evidence of distant metastatic spread.  5.5 x 4.2 left adnexal cyst is new and photopenic.  Follow-up pelvic sonogram is recommended 10 to 12 weeks. - Pelvic ultrasound done on 02/04/2017 showed bilateral ovaries not visualized, obscured by bowel gas.  Heterogeneous appearance of the endometrium, with poorly defined transitional zone.  OB/GYN consultation and possibly MRI of the pelvis may be considered for further evaluation. -She was seen by GYN and is following up as recommended. -PET scan done on 08/15/2017 showed no evidence of local recurrence in the pharyngeal mucosal space or metastatic disease.  No abnormal metabolic activity.  Interval resolution of the left adnexal cystic lesion. -CT neck and chest done on 03/03/2018 reviewed and showed no evidence of recurrent tumor.  RT changes noted.  No metastatic disease noted in the chest. -CT soft tissue neck on 11/08/2019 revealing a normal CT of the neck with  no recurrent mass or adenopathy. -PET scan from 11/21/2020 showed posttreatment changes in neck without evidence of residual or recurrent tumor.  No cervical adenopathy.   PLAN:  1. 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  --Right-sided palpable pea-sized mass near parotid gland.  No tonsillar adenopathy.  No pain with palpation. -Discussed given her last imaging was over a year ago repeating CT soft tissue neck in the next 1 to 2 weeks. -Reviewed previous scan from 11/21/2020 which showed no residual mass to left tonsil.  There was an enlarged intraparotid lymph node on the right side that appeared stable. Repeat imaging.  -She has completed 5 years since radiation therapy. -Keep scheduled appointment in May with Dr. Delton Coombes.  Will forward results once they return.    Orders placed this encounter:  Orders  Placed This Encounter  Procedures   CT SOFT TISSUE NECK W CONTRAST   Disposition-return to clinic as scheduled in May to see Dr. Delton Coombes.  Will get CT scan soft tissue of the neck scheduled in the next 1 to 2 weeks.  Please let me know if your symptoms worsen or fail to improve.  I spent 25 minutes dedicated to the care of this patient (face-to-face and non-face-to-face) on the date of the encounter to include what is described in the assessment and plan.   Faythe Casa, NP 04/13/2022 9:16 AM

## 2022-04-20 ENCOUNTER — Ambulatory Visit (HOSPITAL_COMMUNITY)
Admission: RE | Admit: 2022-04-20 | Discharge: 2022-04-20 | Disposition: A | Discharge status: Home / Self Care | Payer: Self-pay | Source: Ambulatory Visit | Attending: Oncology | Admitting: Oncology

## 2022-04-20 DIAGNOSIS — C090 Malignant neoplasm of tonsillar fossa: Secondary | ICD-10-CM | POA: Insufficient documentation

## 2022-04-20 MED ORDER — IOHEXOL 300 MG/ML  SOLN
75.0000 mL | Freq: Once | INTRAMUSCULAR | Status: AC | PRN
  Administered 2022-04-20: 75 mL via INTRAVENOUS

## 2022-04-22 ENCOUNTER — Encounter: Payer: Self-pay | Admitting: Oncology

## 2022-04-22 NOTE — Progress Notes (Signed)
Hey Dr. Kirtland Bouchard,   This patient came in to see me for right jaw/neck pain.  We ended up doing a CT soft tissue neck which looks great.  I know she has follow-up with you in May and I just wanted to send you the scan.  I have sent her message in MyChart with results.  Durenda Hurt, NP 04/22/2022 10:00 AM

## 2022-05-20 ENCOUNTER — Inpatient Hospital Stay: Payer: Self-pay | Attending: Physician Assistant

## 2022-05-20 DIAGNOSIS — Z79899 Other long term (current) drug therapy: Secondary | ICD-10-CM | POA: Insufficient documentation

## 2022-05-20 DIAGNOSIS — Z923 Personal history of irradiation: Secondary | ICD-10-CM | POA: Insufficient documentation

## 2022-05-20 DIAGNOSIS — D509 Iron deficiency anemia, unspecified: Secondary | ICD-10-CM | POA: Insufficient documentation

## 2022-05-20 DIAGNOSIS — Z85818 Personal history of malignant neoplasm of other sites of lip, oral cavity, and pharynx: Secondary | ICD-10-CM | POA: Insufficient documentation

## 2022-05-26 NOTE — Progress Notes (Signed)
[facility] [address], [place] [address]    Clinic Day:  05/27/2022  Referring physician: Jacquelin Hawking, PA-C  Patient Care Team: Clarita Crane, NP as PCP - General (Nurse Practitioner) Patient, No Pcp Per (General Practice) Jena Gauss, Gerrit Friends, MD as Consulting Physician (Gastroenterology) Gelene Mink, NP (Gastroenterology) Newman Pies, MD as Consulting Physician (Otolaryngology) Barrie Folk, RN (Inactive) as Oncology Nurse Navigator Lonie Peak, MD as Attending Physician (Radiation Oncology)   ASSESSMENT & PLAN:   Assessment: 1. 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 -She was treated with radiation therapy alone by Dr. Basilio Cairo; completed treatment on 04/23/2016. -PET scan on 08/17/2016 showed interval resolution of the left pharyngeal mass.  No significant residual hypermetabolic activity or CT abnormality is observed in this location.  No regional adenopathy or evidence of distant metastatic spread.  5.5 x 4.2 left adnexal cyst is new and photopenic.  Follow-up pelvic sonogram is recommended 10 to 12 weeks.     Plan: 1. 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 - Physical exam: No oropharyngeal masses.  No palpable adenopathy. - CT soft tissue neck on 04/20/2022: No evidence of recurrence. - Labs: TSH 2.327.  LFTs normal. - She completed 5 years since radiation.  I would not do any scans moving forward. - RTC 1 year for follow-up with TSH level and exam.  2.  Iron deficiency anemia: - She has been taking iron tablet daily for several months. - She has menses monthly and occasionally they are heavy.  She complains of severe fatigue. - I discussed intravenous iron therapy 1 g single dose to replenish the iron stores. - We discussed adverse effects including rare chance of anaphylactic reaction.  She is agreeable. - We will  schedule her for the infusion.     Orders Placed This Encounter  Procedures   CBC    Standing Status:   Future    Standing Expiration Date:   05/27/2023   Iron and TIBC ([facility] ONLY)    Standing Status:   Future    Standing Expiration Date:   05/27/2023   Ferritin    Standing Status:   Future    Standing Expiration Date:   05/27/2023   Vitamin B12    Standing Status:   Future    Standing Expiration Date:   05/27/2023      I,Katie Daubenspeck,acting as a scribe for Doreatha Massed, MD.,have documented all relevant documentation on the behalf of Doreatha Massed, MD,as directed by  Doreatha Massed, MD while in the presence of Doreatha Massed, MD.   I, Doreatha Massed MD, have reviewed the above documentation for accuracy and completeness, and I agree with the above.   Doreatha Massed, MD   5/16/20245:07 PM  CHIEF COMPLAINT:   Diagnosis: cancer of the tonsillar fossa    Cancer Staging  Cancer of tonsillar fossa (HCC) Staging form: Pharynx - HPV-Mediated Oropharynx, AJCC 8th Edition - Clinical stage from 01/30/2016: Stage I (cT2, cN1, cM0, p16+) - Signed by Ellouise Newer, PA-C on 02/18/2016 - Pathologic: No stage assigned - Unsigned - Clinical: cT3 - Unsigned    Prior Therapy: Radiation therapy alone, completed on 04/23/2016  Current Therapy:  surveillance   HISTORY OF PRESENT ILLNESS:   Oncology History  Cancer of tonsillar fossa (HCC)  09/1926 Procedure   Gastrostomy tube removed.   01/15/2016 Procedure  Left tonsil biopsy by Dr. Suszanne Conners   01/15/2016 Initial Diagnosis   She presented to Dr. Suszanne Conners after being referred by Lewie Loron, NP (GI) with a 5-6 month history of severe sore throat.  The patient never sought treatment for this issue previously.  Over the last 3 months, patient reported intermittent bleeding from left tonsil.  She also feels a hard mass along the left side of neck.  On physical exam by Dr. Suszanne Conners, he noted 3+  ulceration and erythema on the left tonsil.  On flexible laryngoscopy, tonsillar hypertrophy with ulceration was noted on the left.   01/19/2016 Pathology Results   Invasive squamous cell carcinoma, poorly differentiated.   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 Fine soft tissue details lost at these levels.   Pharyngeal mass centered in the left palatini tonsil with surround mucosal thickening as described below probably representing neoplasm.   Mucosal thickening of the left aspect of hypopharynx and pharynx with inferior most extension to the margin of the supraglottic airway without appreciable invasion into epiglottis, aryepiglottic folds, vocal cords, or paraglottic fat.   Superior most extension of mucosal thickening to the left aspect of the uvula and along the left aspect of the soft palate to the hard/soft palate junction, this region is obscured by motion artifact.   Anteriorly there is effacement of left glossopharyngeal sulcus without appreciable tongue base invasion, this region is obscured by motion artifact.   Asymmetric enlargement of left-sided level 2 and 3 cervical lymph nodes possibly metastatic. No evidence for lymph node necrosis or extra nodal extension.   01/29/2016 Imaging   CT chest-  No evidence of metastatic disease in the chest.   02/06/2016 PET scan   1. Highly hypermetabolic left palatine tonsillar mass, maximum SUV 38.5. 2. A left station IIa lymph node measure 1.1 cm in short axis, mildly enlarged on image 38/3, but has only a borderline elevated SUV at 3.5. 3. Other imaging findings of potential clinical significance: Mild cardiomegaly. Calcified granuloma in the superior segment right lower lobe (benign). IUD satisfactorily positioned in the uterus.   02/11/2016 Procedure   Successful fluoroscopic  insertion of a 20-French pull-through gastrostomy tube.   02/11/2016 Procedure   Successful placement of a right internal jugular approach power injectable Port-A-Cath. The catheter is ready for immediate use.   03/08/2016 - 04/23/2016 Radiation Therapy   Radiation alone Basilio Cairo). Left Tonsil and Bilateral neck / 70 Gy in 35 fractions to gross disease, 63 Gy in 35 fractions to high risk nodal echelons, and 56 Gy in 35 fractions to intermediate risk nodal echelons   10/08/2016 Procedure   Pull-through gastrostomy tube removed.   01/12/2017 Procedure   Port removed by IR      INTERVAL HISTORY:   Heather Strickland is a 47 y.o. female presenting to clinic today for follow up of cancer of the tonsillar fossa. She was last seen by NP Victorino Dike on 04/08/22.  Since her last visit, she underwent surveillance neck CT on 04/20/22 showing no evidence of recurrent disease.  Today, she states that she is doing well overall. Her appetite level is at 75%. Her energy level is at 60%.  PAST MEDICAL HISTORY:   Past Medical History: Past Medical History:  Diagnosis Date   Anemia    GERD (gastroesophageal reflux disease)    History of radiation therapy  03/08/2016 - 04/23/2016   Left Tonsil and Bilateral Neck   HPV in female    Squamous cell carcinoma of left tonsil (HCC) 01/27/2016    Surgical History: Past Surgical History:  Procedure Laterality Date   CESAREAN SECTION  2001,2007   ESOPHAGOGASTRODUODENOSCOPY N/A 11/07/2015   Dr. Jena Gauss: normal esophagus, chronic gastritis    IR GASTROSTOMY TUBE REMOVAL  10/08/2016   IR GENERIC HISTORICAL  02/11/2016   IR FLUORO GUIDE PORT INSERTION RIGHT 02/11/2016 Simonne Come, MD [facility]   IR GENERIC HISTORICAL  02/11/2016   IR US GUIDE VASC ACCESS RIGHT 02/11/2016 Simonne Come, MD [facility]   IR GENERIC HISTORICAL  02/11/2016   IR GASTROSTOMY TUBE MOD SED 02/11/2016 Simonne Come, MD [facility]   IR REMOVAL TUN ACCESS W/ PORT W/O FL MOD SED  01/12/2017   MULTIPLE  EXTRACTIONS WITH ALVEOLOPLASTY N/A 02/20/2016   Procedure: Extraction of tooth #'s 15,17,18 and 32 with alveoloplasty and dental cleaning of teeth;  Surgeon: Charlynne Pander, DDS;  Location: [facility] OR;  Service: Oral Surgery;  Laterality: N/A;    Social History: Social History   Socioeconomic History   Marital status: Married    Spouse name: Not on file   Number of children: 4   Years of education: Not on file   Highest education level: 8th grade  Occupational History   Not on file  Tobacco Use   Smoking status: Never   Smokeless tobacco: Never  Vaping Use   Vaping Use: Never used  Substance and Sexual Activity   Alcohol use: No   Drug use: No   Sexual activity: Not Currently    Birth control/protection: I.U.D.  Other Topics Concern   Not on file  Social History Narrative   Not on file   Social Determinants of Health   Financial Resource Strain: Medium Risk (11/19/2019)   Overall Financial Resource Strain (CARDIA)    Difficulty of Paying Living Expenses: Somewhat hard  Food Insecurity: No Food Insecurity (11/19/2019)   Hunger Vital Sign    Worried About Running Out of Food in the Last Year: Never true    Ran Out of Food in the Last Year: Never true  Transportation Needs: No Transportation Needs (11/19/2019)   PRAPARE - Administrator, Civil Service (Medical): No    Lack of Transportation (Non-Medical): No  Physical Activity: Insufficiently Active (11/19/2019)   Exercise Vital Sign    Days of Exercise per Week: 1 day    Minutes of Exercise per Session: 30 min  Stress: No Stress Concern Present (11/19/2019)   [employer] of Occupational Health - Occupational Stress Questionnaire    Feeling of Stress : Only a little  Social Connections: Moderately Isolated (11/19/2019)   Social Connection and Isolation Panel [NHANES]    Frequency of Communication with Friends and Family: More than three times a week    Frequency of Social Gatherings with Friends and Family:  Never    Attends Religious Services: More than 4 times per year    Active Member of [employer] or Organizations: No    Attends Banker Meetings: Never    Marital Status: Separated  Intimate Partner Violence: Not At Risk (11/19/2019)   Humiliation, Afraid, Rape, and Kick questionnaire    Fear of Current or Ex-Partner: No    Emotionally Abused: No    Physically Abused: No    Sexually Abused: No    Family History: Family History  Problem Relation Age of Onset  Diabetes Mother    Hypertension Mother    Prostatitis Father    ADD / ADHD Daughter    Cerebral palsy Son     Current Medications:  Current Outpatient Medications:    ferrous sulfate 325 (65 FE) MG tablet, Take 1 tablet (325 mg total) by mouth daily., Disp: 30 tablet, Rfl: 0   hydrOXYzine (ATARAX/VISTARIL) 25 MG tablet, Take 1 tablet (25 mg total) by mouth every 12 (twelve) hours as needed for anxiety., Disp: 30 tablet, Rfl: 0   levothyroxine (SYNTHROID) 75 MCG tablet, Take 75 mcg by mouth daily., Disp: , Rfl:    loratadine (CLARITIN) 10 MG tablet, Take 10 mg by mouth daily as needed. , Disp: , Rfl:    Multiple Vitamins-Minerals (MULTIVITAMIN WITH MINERALS) tablet, Take 1 tablet by mouth daily., Disp: , Rfl:    norethindrone (MICRONOR) 0.35 MG tablet, Take 1 tablet (0.35 mg total) by mouth daily., Disp: 28 tablet, Rfl: 11   Allergies: Allergies  Allergen Reactions   No Known Allergies     REVIEW OF SYSTEMS:   Review of Systems  Constitutional:  Positive for fatigue. Negative for chills and fever.  HENT:   Negative for lump/mass, mouth sores, nosebleeds, sore throat and trouble swallowing.   Eyes:  Negative for eye problems.  Respiratory:  Negative for cough and shortness of breath.   Cardiovascular:  Negative for chest pain, leg swelling and palpitations.  Gastrointestinal:  Negative for abdominal pain, constipation, diarrhea, nausea and vomiting.  Genitourinary:  Negative for bladder incontinence,  difficulty urinating, dysuria, frequency, hematuria and nocturia.   Musculoskeletal:  Negative for arthralgias, back pain, flank pain, myalgias and neck pain.  Skin:  Negative for itching and rash.  Neurological:  Negative for dizziness, headaches and numbness.  Hematological:  Does not bruise/bleed easily.  Psychiatric/Behavioral:  Negative for depression, sleep disturbance and suicidal ideas. The patient is not nervous/anxious.   All other systems reviewed and are negative.    VITALS:   Blood pressure 137/79, pulse 80, temperature 97.6 F (36.4 C), temperature source Oral, resp. rate 18, weight 154 lb 12.8 oz (70.2 kg), SpO2 99 %.  Wt Readings from Last 3 Encounters:  05/27/22 154 lb 12.8 oz (70.2 kg)  04/08/22 156 lb 8.4 oz (71 kg)  05/28/21 153 lb 14.1 oz (69.8 kg)    Body mass index is 28.31 kg/m.  Performance status (ECOG): 0 - Asymptomatic  PHYSICAL EXAM:   Physical Exam Vitals and nursing note reviewed. Exam conducted with a chaperone present.  Constitutional:      Appearance: Normal appearance.  Cardiovascular:     Rate and Rhythm: Normal rate and regular rhythm.     Pulses: Normal pulses.     Heart sounds: Normal heart sounds.  Pulmonary:     Effort: Pulmonary effort is normal.     Breath sounds: Normal breath sounds.  Abdominal:     Palpations: Abdomen is soft. There is no hepatomegaly, splenomegaly or mass.     Tenderness: There is no abdominal tenderness.  Musculoskeletal:     Right lower leg: No edema.     Left lower leg: No edema.  Lymphadenopathy:     Cervical: No cervical adenopathy.     Right cervical: No superficial, deep or posterior cervical adenopathy.    Left cervical: No superficial, deep or posterior cervical adenopathy.     Upper Body:     Right upper body: No supraclavicular or axillary adenopathy.     Left upper body: No supraclavicular  or axillary adenopathy.  Neurological:     General: No focal deficit present.     Mental Status: She is  alert and oriented to person, place, and time.  Psychiatric:        Mood and Affect: Mood normal.        Behavior: Behavior normal.     LABS:      Latest Ref Rng & Units 05/27/2022    8:36 AM 05/26/2021    9:16 AM 11/21/2020    9:08 AM  CBC  WBC 4.0 - 10.5 K/uL 4.5  4.4  3.6   Hemoglobin 12.0 - 15.0 g/dL 09.8  11.9  14.7   Hematocrit 36.0 - 46.0 % 34.4  33.8  32.4   Platelets 150 - 400 K/uL 261  235  267       Latest Ref Rng & Units 05/27/2022    8:36 AM 05/26/2021    9:16 AM 11/21/2020    9:08 AM  CMP  Glucose 70 - 99 mg/dL 829  562  130   BUN 6 - 20 mg/dL 13  14  11    Creatinine 0.44 - 1.00 mg/dL 8.65  7.84  6.96   Sodium 135 - 145 mmol/L 137  137  138   Potassium 3.5 - 5.1 mmol/L 3.8  4.1  4.4   Chloride 98 - 111 mmol/L 105  109  104   CO2 22 - 32 mmol/L 27  24  27    Calcium 8.9 - 10.3 mg/dL 9.1  8.8  9.2   Total Protein 6.5 - 8.1 g/dL 7.2  6.8  7.0   Total Bilirubin 0.3 - 1.2 mg/dL 0.7  0.4  1.3   Alkaline Phos 38 - 126 U/L 63  61  60   AST 15 - 41 U/L 17  18  21    ALT 0 - 44 U/L 12  15  13       No results found for: "CEA1", "CEA" / No results found for: "CEA1", "CEA" No results found for: "PSA1" No results found for: "EXB284" No results found for: "CAN125"  No results found for: "TOTALPROTELP", "ALBUMINELP", "A1GS", "A2GS", "BETS", "BETA2SER", "GAMS", "MSPIKE", "SPEI" Lab Results  Component Value Date   TIBC 449 05/27/2022   TIBC 398 05/26/2021   FERRITIN 3 (L) 05/27/2022   FERRITIN 8 (L) 05/26/2021   IRONPCTSAT 8 (L) 05/27/2022   IRONPCTSAT 12 05/26/2021   Lab Results  Component Value Date   LDH 125 11/21/2020   LDH 126 05/15/2020   LDH 116 11/08/2019     STUDIES:   No results found.

## 2022-05-27 ENCOUNTER — Inpatient Hospital Stay (HOSPITAL_BASED_OUTPATIENT_CLINIC_OR_DEPARTMENT_OTHER): Payer: Self-pay | Admitting: Hematology

## 2022-05-27 ENCOUNTER — Inpatient Hospital Stay: Payer: Self-pay

## 2022-05-27 VITALS — BP 137/79 | HR 80 | Temp 97.6°F | Resp 18 | Wt 154.8 lb

## 2022-05-27 DIAGNOSIS — D508 Other iron deficiency anemias: Secondary | ICD-10-CM

## 2022-05-27 DIAGNOSIS — C090 Malignant neoplasm of tonsillar fossa: Secondary | ICD-10-CM

## 2022-05-27 LAB — CBC WITH DIFFERENTIAL/PLATELET
Abs Immature Granulocytes: 0.01 10*3/uL (ref 0.00–0.07)
Basophils Absolute: 0 10*3/uL (ref 0.0–0.1)
Basophils Relative: 1 %
Eosinophils Absolute: 0.2 10*3/uL (ref 0.0–0.5)
Eosinophils Relative: 5 %
HCT: 34.4 % — ABNORMAL LOW (ref 36.0–46.0)
Hemoglobin: 11.1 g/dL — ABNORMAL LOW (ref 12.0–15.0)
Immature Granulocytes: 0 %
Lymphocytes Relative: 21 %
Lymphs Abs: 0.9 10*3/uL (ref 0.7–4.0)
MCH: 29.4 pg (ref 26.0–34.0)
MCHC: 32.3 g/dL (ref 30.0–36.0)
MCV: 91.2 fL (ref 80.0–100.0)
Monocytes Absolute: 0.4 10*3/uL (ref 0.1–1.0)
Monocytes Relative: 8 %
Neutro Abs: 2.9 10*3/uL (ref 1.7–7.7)
Neutrophils Relative %: 65 %
Platelets: 261 10*3/uL (ref 150–400)
RBC: 3.77 MIL/uL — ABNORMAL LOW (ref 3.87–5.11)
RDW: 14.2 % (ref 11.5–15.5)
WBC: 4.5 10*3/uL (ref 4.0–10.5)
nRBC: 0 % (ref 0.0–0.2)

## 2022-05-27 LAB — COMPREHENSIVE METABOLIC PANEL
ALT: 12 U/L (ref 0–44)
AST: 17 U/L (ref 15–41)
Albumin: 4 g/dL (ref 3.5–5.0)
Alkaline Phosphatase: 63 U/L (ref 38–126)
Anion gap: 5 (ref 5–15)
BUN: 13 mg/dL (ref 6–20)
CO2: 27 mmol/L (ref 22–32)
Calcium: 9.1 mg/dL (ref 8.9–10.3)
Chloride: 105 mmol/L (ref 98–111)
Creatinine, Ser: 0.61 mg/dL (ref 0.44–1.00)
GFR, Estimated: >60 mL/min (ref >60)
Glucose, Bld: 112 mg/dL — ABNORMAL HIGH (ref 70–99)
Potassium: 3.8 mmol/L (ref 3.5–5.1)
Sodium: 137 mmol/L (ref 135–145)
Total Bilirubin: 0.7 mg/dL (ref 0.3–1.2)
Total Protein: 7.2 g/dL (ref 6.5–8.1)

## 2022-05-27 LAB — IRON AND TIBC
Iron: 34 ug/dL (ref 28–170)
Saturation Ratios: 8 % — ABNORMAL LOW (ref 10.4–31.8)
TIBC: 449 ug/dL (ref 250–450)
UIBC: 415 ug/dL

## 2022-05-27 LAB — FERRITIN: Ferritin: 3 ng/mL — ABNORMAL LOW (ref 11–307)

## 2022-05-27 LAB — TSH: TSH: 2.327 u[IU]/mL (ref 0.350–4.500)

## 2022-05-27 NOTE — Patient Instructions (Addendum)
[facility] at [facility] Discharge Instructions   You were seen and examined today by Dr. Ellin Saba.  He reviewed the results of your lab work. Your iron stores are severely low at 3. We will arrange for you to have IV iron in the clinic. Your thyroid test and your CT scan were good.   Return as scheduled.    Thank you for choosing [facility] at [facility] to provide your oncology and hematology care.  To afford each patient quality time with our provider, please arrive at least 15 minutes before your scheduled appointment time.   If you have a lab appointment with the Cancer Center please come in thru the Main Entrance and check in at the main information desk.  You need to re-schedule your appointment should you arrive 10 or more minutes late.  We strive to give you quality time with our providers, and arriving late affects you and other patients whose appointments are after yours.  Also, if you no show three or more times for appointments you may be dismissed from the clinic at the providers discretion.     Again, thank you for choosing [facility].  Our hope is that these requests will decrease the amount of time that you wait before being seen by our physicians.       _____________________________________________________________  Should you have questions after your visit to [facility], please contact our office at [phone] and follow the prompts.  Our office hours are 8:00 a.m. and 4:30 p.m. Monday - Friday.  Please note that voicemails left after 4:00 p.m. may not be returned until the following business day.  We are closed weekends and major holidays.  You do have access to a nurse 24-7, just call the main number to the clinic [phone] and do not press any options, hold on the line and a nurse will answer the phone.    For prescription refill requests, have your pharmacy contact our office and allow 72  hours.    Due to Covid, you will need to wear a mask upon entering the hospital. If you do not have a mask, a mask will be given to you at the Main Entrance upon arrival. For doctor visits, patients may have 1 support person age 59 or older with them. For treatment visits, patients can not have anyone with them due to social distancing guidelines and our immunocompromised population.

## 2022-06-10 ENCOUNTER — Other Ambulatory Visit: Payer: Self-pay

## 2022-06-21 ENCOUNTER — Inpatient Hospital Stay: Payer: Self-pay | Attending: Physician Assistant

## 2022-06-21 VITALS — BP 125/79 | HR 64 | Temp 97.6°F | Resp 16

## 2022-06-21 DIAGNOSIS — Z85818 Personal history of malignant neoplasm of other sites of lip, oral cavity, and pharynx: Secondary | ICD-10-CM | POA: Insufficient documentation

## 2022-06-21 DIAGNOSIS — C090 Malignant neoplasm of tonsillar fossa: Secondary | ICD-10-CM

## 2022-06-21 DIAGNOSIS — D509 Iron deficiency anemia, unspecified: Secondary | ICD-10-CM | POA: Insufficient documentation

## 2022-06-21 DIAGNOSIS — D508 Other iron deficiency anemias: Secondary | ICD-10-CM

## 2022-06-21 DIAGNOSIS — Z95828 Presence of other vascular implants and grafts: Secondary | ICD-10-CM

## 2022-06-21 MED ORDER — ACETAMINOPHEN 325 MG PO TABS
650.0000 mg | ORAL_TABLET | Freq: Once | ORAL | Status: AC
  Administered 2022-06-21: 650 mg via ORAL
  Filled 2022-06-21: qty 2

## 2022-06-21 MED ORDER — SODIUM CHLORIDE 0.9 % IV SOLN: Freq: Once | INTRAVENOUS | Status: AC

## 2022-06-21 MED ORDER — SODIUM CHLORIDE 0.9 % IV SOLN
500.0000 mg | Freq: Once | INTRAVENOUS | Status: AC
  Administered 2022-06-21: 500 mg via INTRAVENOUS
  Filled 2022-06-21: qty 5

## 2022-06-21 MED ORDER — CETIRIZINE HCL 10 MG PO TABS
10.0000 mg | ORAL_TABLET | Freq: Once | ORAL | Status: AC
  Administered 2022-06-21: 10 mg via ORAL
  Filled 2022-06-21: qty 1

## 2022-06-21 NOTE — Patient Instructions (Signed)
Iron Sucrose Injection Qu es este medicamento? El HIERRO SACAROSA trata los niveles bajos de hierro (anemia por deficiencia de hierro) en personas con enfermedad renal. El hierro es un mineral que cumple una funcin importante en la produccin de glbulos rojos, que llevan el oxgeno de los pulmones al resto del cuerpo. Este medicamento puede ser utilizado para otros usos; si tiene alguna pregunta consulte con su proveedor de atencin mdica o con su farmacutico. MARCAS COMUNES: Venofer Qu le debo informar a mi profesional de la salud antes de tomar este medicamento? Necesitan saber si usted presenta alguno de los siguientes problemas o situaciones: Anemia no causada por niveles bajos de hierro Enfermedad cardiaca Niveles altos de hierro en la sangre Enfermedad renal Enfermedad heptica Una reaccin alrgica o inusual al hierro, a otros medicamentos, alimentos, colorantes o conservantes Si est embarazada o buscando quedar embarazada Si est amamantando a un beb Cmo debo utilizar este medicamento? Este medicamento se administra mediante infusin en una vena. Se administra en un hospital o en un entorno clnico. Hable con su equipo de atencin sobre el uso de este medicamento en nios. Aunque este medicamento se puede recetar a nios tan pequeos como de 2 aos de edad con ciertas afecciones, existen precauciones que deben tomarse. Sobredosis: Pngase en contacto inmediatamente con un centro toxicolgico o una sala de urgencia si usted cree que haya tomado demasiado medicamento. ATENCIN: Este medicamento es solo para usted. No comparta este medicamento con nadie. Qu sucede si me olvido de una dosis? Cumpla con las citas para dosis de seguimiento. Es importante no olvidar ninguna dosis. Llame a su equipo de atencin si no puede asistir a una cita. Qu puede interactuar con este medicamento? No use este medicamento con ninguno de los siguientes  productos: Deferoxamina Dimercaprol Otros productos con hierro Este medicamento tambin podra interactuar con los siguientes productos: Cloranfenicol Deferasirox Puede ser que esta lista no menciona todas las posibles interacciones. Informe a su profesional de la salud de todos los productos a base de hierbas, medicamentos de venta libre o suplementos nutritivos que est tomando. Si usted fuma, consume bebidas alcohlicas o si utiliza drogas ilegales, indqueselo tambin a su profesional de la salud. Algunas sustancias pueden interactuar con su medicamento. A qu debo estar atento al usar este medicamento? Visite peridicamente a su equipo de atencin. Si los sntomas no comienzan a mejorar o si empeoran, informe a su equipo de atencin. Usted podra necesitar realizarse anlisis de sangre mientras est usando este medicamento. Es posible que deba seguir una dieta especial. Hable con su equipo de atencin. Los alimentos que contienen hierro incluyen: granos enteros/cereales, frutas secas, legumbres, o guisantes, verduras de hojas verdes y asaduras (hgado, rin). Qu efectos secundarios puedo tener al utilizar este medicamento? Efectos secundarios que debe informar a su equipo de atencin tan pronto como sea posible: Reacciones alrgicas: erupcin cutnea, comezn/picazn, urticaria, hinchazn de la cara, los labios, la lengua o la garganta Presin arterial baja: mareo, sensacin de desmayo o aturdimiento, visin borrosa Falta de aliento Efectos secundarios que generalmente no requieren atencin mdica (debe informarlos a su equipo de atencin si persisten o si son molestos): Enrojecimiento Dolor de cabeza Dolor en las articulaciones Dolor muscular Nuseas Dolor, enrojecimiento o irritacin en el lugar de la inyeccin Puede ser que esta lista no menciona todos los posibles efectos secundarios. Comunquese a su mdico por asesoramiento mdico sobre los efectos secundarios. Usted puede  informar los efectos secundarios a la FDA por telfono al 1-800-FDA-1088. Dnde debo guardar mi medicina?   Este medicamento se administra en hospitales o clnicas y no es necesario guardarlo en su domicilio. ATENCIN: Este folleto es un resumen. Puede ser que no cubra toda la posible informacin. Si usted tiene preguntas acerca de esta medicina, consulte con su mdico, su farmacutico o su profesional de la salud.  2024 Elsevier/Gold Standard (2021-11-18 00:00:00)    

## 2022-06-21 NOTE — Progress Notes (Signed)
Patient tolerated first infusion of Venofer 500mg  without issue. Vitals stable upon discharge and patient has no complaints.

## 2022-06-28 ENCOUNTER — Inpatient Hospital Stay: Payer: Self-pay

## 2022-06-28 VITALS — BP 131/71 | HR 66 | Temp 97.8°F | Resp 16

## 2022-06-28 DIAGNOSIS — C090 Malignant neoplasm of tonsillar fossa: Secondary | ICD-10-CM

## 2022-06-28 DIAGNOSIS — D508 Other iron deficiency anemias: Secondary | ICD-10-CM

## 2022-06-28 DIAGNOSIS — Z95828 Presence of other vascular implants and grafts: Secondary | ICD-10-CM

## 2022-06-28 MED ORDER — SODIUM CHLORIDE 0.9 % IV SOLN
500.0000 mg | Freq: Once | INTRAVENOUS | Status: AC
  Administered 2022-06-28: 500 mg via INTRAVENOUS
  Filled 2022-06-28: qty 20

## 2022-06-28 MED ORDER — CETIRIZINE HCL 10 MG PO TABS
10.0000 mg | ORAL_TABLET | Freq: Once | ORAL | Status: AC
  Administered 2022-06-28: 10 mg via ORAL
  Filled 2022-06-28: qty 1

## 2022-06-28 MED ORDER — ACETAMINOPHEN 325 MG PO TABS
650.0000 mg | ORAL_TABLET | Freq: Once | ORAL | Status: AC
  Administered 2022-06-28: 650 mg via ORAL
  Filled 2022-06-28: qty 2

## 2022-06-28 MED ORDER — SODIUM CHLORIDE 0.9 % IV SOLN: Freq: Once | INTRAVENOUS | Status: AC

## 2022-06-28 NOTE — Patient Instructions (Signed)
Instrucciones al darle de alta: Discharge Instructions Gracias por elegir al [facility] para [place]ar[place] atencin mdica de oncologa y hematologa.  Si usted tiene cita de laboratorio con el [facility], por favor entre por la puerta principal y regstrese en el escritorio central de informacin.   Use ropa cmoda y adecuada para tener fcil acceso a las vas del Portacath (acceso venoso de larga duracin) o la lnea PICC (catter central colocado por va perifrica).   Nos esforzamos por ofrecerle tiempo de calidad con su proveedor. Es posible que tenga que volver a programar su cita si llega tarde (15 minutos o ms).  El llegar tarde le afecta a usted y a otros pacientes cuyas citas son posteriores a la suya.  Adems, si usted falta a tres o ms citas sin avisar a la oficina, puede ser retirado(a) de la clnica a discrecin del proveedor.      Para las solicitudes de renovacin de recetas, pida a su farmacia que se ponga en contacto con nuestra oficina y deje que transcurran 72 horas para que se complete el proceso de las renovaciones.    Hoy usted recibi los siguientes agentes de quimioterapia e/o inmunoterapia iron infusion   Para ayudar a prevenir las nuseas y los vmitos despus de su tratamiento, le recomendamos que tome su medicamento para las nuseas segn las indicaciones.  LOS SNTOMAS QUE DEBEN COMUNICARSE INMEDIATAMENTE SE INDICAN A CONTINUACIN: *FIEBRE SUPERIOR A 100.4 F (38 C) O MS *ESCALOFROS O SUDORACIN *NUSEAS Y VMITOS QUE NO SE CONTROLAN CON EL MEDICAMENTO PARA LAS NUSEAS *DIFICULTAD INUSUAL PARA RESPIRAR  *MORETONES O HEMORRAGIAS NO HABITUALES *PROBLEMAS URINARIOS (dolor o ardor al orinar o frecuencia para orinar) *PROBLEMAS INTESTINALES (diarrea inusual, estreimiento, dolor cerca del ano) SENSIBILIDAD EN LA BOCA Y EN LA GARGANTA CON O SIN LA PRESENCIA DE LCERAS (dolor de garganta, llagas en la boca o dolor de muelas/dientes) ERUPCIN,  HINCHAZN O DOLORES INUSUALES FLUJO VAGINAL INUSUAL O PICAZN/RASQUIA    Los puntos marcados con un asterisco ( *) indican una posible emergencia y debe hacer un seguimiento tan pronto como le sea posible o vaya al Departamento de Emergencias si se le presenta algn problema.  Por favor, muestre la TARJETA DE ADVERTENCIA DE QUIMIOTERAPIA O LA TARJETA DE ADVERTENCIA DE INMUNOTERAPIA al registrarse en el Departamento de Emergencias y a la enfermera de triaje.  Si tiene preguntas despus de su visita o necesita cancelar o volver a programar su cita, por favor pngase en contacto con [facility] [phone]  y siga las instrucciones. Las horas de oficina son de 8:00 a.m. a 4:30 p.m. de lunes a viernes. Por favor, tenga en cuenta que los mensajes de voz que se dejan despus de las 4:00 p.m. posiblemente no se devolvern hasta el siguiente da de trabajo.  Cerramos los fines de semana y los das festivos importantes. En todo momento tiene acceso a una enfermera para preguntas urgentes. Por favor, llame al nmero principal de la clnica [phone] y siga las instrucciones.   Para cualquier pregunta que no sea de carcter urgente, tambin puede ponerse en contacto con su proveedor utilizando MyChart. Ahora ofrecemos visitas electrnicas para cualquier persona mayor de 18 aos que solicite atencin mdica en lnea para los sntomas que no sean urgentes. Para ms detalles vaya a [email or web address].   Tambin puede bajar la aplicacin de MyChart! Vaya a la tienda de aplicaciones, busque "MyChart", abra la aplicacin, seleccione [place], e ingrese con su nombre   de usuario y la contrasea de MyChart.  

## 2022-06-28 NOTE — Progress Notes (Signed)
Daughter at bedside today interpreting when needed.   Iron infusion given per orders. Patient tolerated it well without problems. Vitals stable and discharged home from clinic ambulatory. Follow up as scheduled.

## 2023-05-18 ENCOUNTER — Inpatient Hospital Stay: Payer: Self-pay | Attending: Hematology

## 2023-05-18 DIAGNOSIS — Z85818 Personal history of malignant neoplasm of other sites of lip, oral cavity, and pharynx: Secondary | ICD-10-CM | POA: Insufficient documentation

## 2023-05-18 DIAGNOSIS — C090 Malignant neoplasm of tonsillar fossa: Secondary | ICD-10-CM

## 2023-05-18 DIAGNOSIS — D509 Iron deficiency anemia, unspecified: Secondary | ICD-10-CM | POA: Insufficient documentation

## 2023-05-18 DIAGNOSIS — Z923 Personal history of irradiation: Secondary | ICD-10-CM | POA: Insufficient documentation

## 2023-05-18 DIAGNOSIS — D508 Other iron deficiency anemias: Secondary | ICD-10-CM

## 2023-05-18 LAB — FERRITIN: Ferritin: 5 ng/mL — ABNORMAL LOW (ref 11–307)

## 2023-05-18 LAB — IRON AND TIBC
Iron: 29 ug/dL (ref 28–170)
Saturation Ratios: 7 % — ABNORMAL LOW (ref 10.4–31.8)
TIBC: 441 ug/dL (ref 250–450)
UIBC: 412 ug/dL

## 2023-05-18 LAB — CBC
HCT: 30 % — ABNORMAL LOW (ref 36.0–46.0)
Hemoglobin: 9 g/dL — ABNORMAL LOW (ref 12.0–15.0)
MCH: 27.4 pg (ref 26.0–34.0)
MCHC: 30 g/dL (ref 30.0–36.0)
MCV: 91.2 fL (ref 80.0–100.0)
Platelets: 275 10*3/uL (ref 150–400)
RBC: 3.29 MIL/uL — ABNORMAL LOW (ref 3.87–5.11)
RDW: 15.1 % (ref 11.5–15.5)
WBC: 5.4 10*3/uL (ref 4.0–10.5)
nRBC: 0 % (ref 0.0–0.2)

## 2023-05-18 LAB — VITAMIN B12: Vitamin B-12: 939 pg/mL — ABNORMAL HIGH (ref 180–914)

## 2023-05-25 ENCOUNTER — Inpatient Hospital Stay: Payer: Self-pay | Admitting: Hematology

## 2023-05-25 VITALS — BP 123/77 | HR 75 | Temp 98.4°F | Resp 16 | Wt 145.3 lb

## 2023-05-25 DIAGNOSIS — Z923 Personal history of irradiation: Secondary | ICD-10-CM

## 2023-05-25 DIAGNOSIS — D508 Other iron deficiency anemias: Secondary | ICD-10-CM

## 2023-05-25 DIAGNOSIS — Z85818 Personal history of malignant neoplasm of other sites of lip, oral cavity, and pharynx: Secondary | ICD-10-CM

## 2023-05-25 DIAGNOSIS — C090 Malignant neoplasm of tonsillar fossa: Secondary | ICD-10-CM

## 2023-05-25 NOTE — Patient Instructions (Addendum)
 [facility] at [facility] Discharge Instructions   You were seen and examined today by Dr. Cheree Cords.  He reviewed the results of your lab work which are mostly normal/stable. Your iron  is low and your hemoglobin is low as well. We will arrange for you to have two iron  infusions one week apart in the clinic.   We will see you back in 6 months. We will repeat lab work prior to this visit.   Return as scheduled.    Thank you for choosing [facility] at [facility] to provide your oncology and hematology care.  To afford each patient quality time with our provider, please arrive at least 15 minutes before your scheduled appointment time.   If you have a lab appointment with the Cancer Center please come in thru the Main Entrance and check in at the main information desk.  You need to re-schedule your appointment should you arrive 10 or more minutes late.  We strive to give you quality time with our providers, and arriving late affects you and other patients whose appointments are after yours.  Also, if you no show three or more times for appointments you may be dismissed from the clinic at the providers discretion.     Again, thank you for choosing [facility].  Our hope is that these requests will decrease the amount of time that you wait before being seen by our physicians.       _____________________________________________________________  Should you have questions after your visit to [facility], please contact our office at [phone] and follow the prompts.  Our office hours are 8:00 a.m. and 4:30 p.m. Monday - Friday.  Please note that voicemails left after 4:00 p.m. may not be returned until the following business day.  We are closed weekends and major holidays.  You do have access to a nurse 24-7, just call the main number to the clinic [phone] and do not press any options, hold on the line and a nurse will  answer the phone.    For prescription refill requests, have your pharmacy contact our office and allow 72 hours.    Due to Covid, you will need to wear a mask upon entering the hospital. If you do not have a mask, a mask will be given to you at the Main Entrance upon arrival. For doctor visits, patients may have 1 support person age 53 or older with them. For treatment visits, patients can not have anyone with them due to social distancing guidelines and our immunocompromised population.

## 2023-05-25 NOTE — Progress Notes (Signed)
 [facility] [address], [place] [address]    Clinic Day:  05/25/2023  Referring physician: Altamease Asters, NP  Patient Care Team: Altamease Asters, NP as PCP - General (Nurse Practitioner) Patient, No Pcp Per (General Practice) Riley Cheadle, Windsor Hatcher, MD as Consulting Physician (Gastroenterology) Delman Ferns, NP (Gastroenterology) Reynold Caves, MD as Consulting Physician (Otolaryngology) Kristy Phenes, RN (Inactive) as Oncology Nurse Navigator Colie Dawes, MD as Attending Physician (Radiation Oncology)   ASSESSMENT & PLAN:   Assessment: 1. 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 -She was treated with radiation therapy alone by Dr. Lurena Sally; completed treatment on 04/23/2016. -PET scan on 08/17/2016 showed interval resolution of the left pharyngeal mass.  No significant residual hypermetabolic activity or CT abnormality is observed in this location.  No regional adenopathy or evidence of distant metastatic spread.  5.5 x 4.2 left adnexal cyst is new and photopenic.  Follow-up pelvic sonogram is recommended 10 to 12 weeks.     Plan: 1. 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 - She reports her tongue gets stiff on and off mostly in the mornings.  It improves by itself.  Also reported some cramps on the left side of the neck extending to the shoulder blade.  These are likely postradiation effects. - Physical exam: No oropharyngeal masses.  No palpable adenopathy. - Last TSH was a year ago normal.  I will repeat a TSH level at next visit.  We will do scans only if any concern for recurrence.  2.  Iron  deficiency anemia: - She is taking iron  tablet for more than a year. - She denies any BRBPR/melena.  However she has occasional heavy menses. - Last Venofer  was on 06/28/2022. - Labs from 05/18/2023: Ferritin is low at 5.  B12 is normal.  Hemoglobin is  low at 9. - Recommend Venofer  500 mg IV x 2. - RTC 6 months with repeat CBC, ferritin and iron  panel.     Orders Placed This Encounter  Procedures   TSH    Standing Status:   Future    Expected Date:   06/01/2023    Expiration Date:   05/24/2024   TSH    Standing Status:   Future    Expected Date:   11/21/2023    Expiration Date:   05/24/2024   Iron  and TIBC ([facility] ONLY)    Standing Status:   Future    Expected Date:   11/21/2023    Expiration Date:   05/24/2024   Ferritin    Standing Status:   Future    Expected Date:   11/21/2023    Expiration Date:   05/24/2024   CBC with Differential    Standing Status:   Future    Expected Date:   11/21/2023    Expiration Date:   05/24/2024      Heather Strickland,acting as a scribe for Paulett Boros, MD.,have documented all relevant documentation on the behalf of Paulett Boros, MD,as directed by  Paulett Boros, MD while in the presence of Paulett Boros, MD.  I, Paulett Boros MD, have reviewed the above documentation for accuracy and completeness, and I agree with the above.    Paulett Boros, MD   5/14/20254:06 PM  CHIEF COMPLAINT:   Diagnosis: cancer of the tonsillar fossa    Cancer Staging  Cancer of tonsillar fossa (  HCC) Staging form: Pharynx - HPV-Mediated Oropharynx, AJCC 8th Edition - Clinical stage from 01/30/2016: Stage I (cT2, cN1, cM0, p16+) - Signed by Doretta Gant, PA-C on 02/18/2016 - Pathologic: No stage assigned - Unsigned - Clinical: cT3 - Unsigned    Prior Therapy: Radiation therapy alone, completed on 04/23/2016  Current Therapy:  surveillance   HISTORY OF PRESENT ILLNESS:   Oncology History  Cancer of tonsillar fossa (HCC)  09/1926 Procedure   Gastrostomy tube removed.   01/15/2016 Procedure   Left tonsil biopsy by Dr. Darlin Ehrlich   01/15/2016 Initial Diagnosis   She presented to Dr. Darlin Ehrlich after being referred by Karna Pacas, NP (GI) with a 5-6 month  history of severe sore throat.  The patient never sought treatment for this issue previously.  Over the last 3 months, patient reported intermittent bleeding from left tonsil.  She also feels a hard mass along the left side of neck.  On physical exam by Dr. Darlin Ehrlich, he noted 3+ ulceration and erythema on the left tonsil.  On flexible laryngoscopy, tonsillar hypertrophy with ulceration was noted on the left.   01/19/2016 Pathology Results   Invasive squamous cell carcinoma, poorly differentiated.   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 Fine soft tissue details lost at these levels.   Pharyngeal mass centered in the left palatini tonsil with surround mucosal thickening as described below probably representing neoplasm.   Mucosal thickening of the left aspect of hypopharynx and pharynx with inferior most extension to the margin of the supraglottic airway without appreciable invasion into epiglottis, aryepiglottic folds, vocal cords, or paraglottic fat.   Superior most extension of mucosal thickening to the left aspect of the uvula and along the left aspect of the soft palate to the hard/soft palate junction, this region is obscured by motion artifact.   Anteriorly there is effacement of left glossopharyngeal sulcus without appreciable tongue base invasion, this region is obscured by motion artifact.   Asymmetric enlargement of left-sided level 2 and 3 cervical lymph nodes possibly metastatic. No evidence for lymph node necrosis or extra nodal extension.   01/29/2016 Imaging   CT chest-  No evidence of metastatic disease in the chest.   02/06/2016 PET scan   1. Highly hypermetabolic left palatine tonsillar mass, maximum SUV 38.5. 2. A left station IIa lymph node measure 1.1 cm in short axis, mildly enlarged on image 38/3, but has only a borderline  elevated SUV at 3.5. 3. Other imaging findings of potential clinical significance: Mild cardiomegaly. Calcified granuloma in the superior segment right lower lobe (benign). IUD satisfactorily positioned in the uterus.   02/11/2016 Procedure   Successful fluoroscopic insertion of a 20-French pull-through gastrostomy tube.   02/11/2016 Procedure   Successful placement of a right internal jugular approach power injectable Port-A-Cath. The catheter is ready for immediate use.   03/08/2016 - 04/23/2016 Radiation Therapy   Radiation alone Lurena Sally). Left Tonsil and Bilateral neck / 70 Gy in 35 fractions to gross disease, 63 Gy in 35 fractions to high risk nodal echelons, and 56 Gy in 35 fractions to intermediate risk nodal echelons   10/08/2016 Procedure   Pull-through gastrostomy tube removed.   01/12/2017 Procedure   Port removed by IR      INTERVAL HISTORY:   Heather Strickland is a 48 y.o. female presenting to clinic today for follow  up of cancer of the tonsillar fossa. She was last seen by me on 05/27/22.  Today, she states that she is doing well overall. Her appetite level is at 75%. Her energy level is at 75%.  PAST MEDICAL HISTORY:   Past Medical History: Past Medical History:  Diagnosis Date   Anemia    GERD (gastroesophageal reflux disease)    History of radiation therapy 03/08/2016 - 04/23/2016   Left Tonsil and Bilateral Neck   HPV in female    Squamous cell carcinoma of left tonsil (HCC) 01/27/2016    Surgical History: Past Surgical History:  Procedure Laterality Date   CESAREAN SECTION  2001,2007   ESOPHAGOGASTRODUODENOSCOPY N/A 11/07/2015   Dr. Riley Cheadle: normal esophagus, chronic gastritis    IR GASTROSTOMY TUBE REMOVAL  10/08/2016   IR GENERIC HISTORICAL  02/11/2016   IR FLUORO GUIDE PORT INSERTION RIGHT 02/11/2016 Robbi Childs, MD [facility]   IR GENERIC HISTORICAL  02/11/2016   IR US  GUIDE VASC ACCESS RIGHT 02/11/2016 Robbi Childs, MD [facility]   IR GENERIC HISTORICAL   02/11/2016   IR GASTROSTOMY TUBE MOD SED 02/11/2016 Robbi Childs, MD [facility]   IR REMOVAL TUN ACCESS W/ PORT W/O FL MOD SED  01/12/2017   MULTIPLE EXTRACTIONS WITH ALVEOLOPLASTY N/A 02/20/2016   Procedure: Extraction of tooth #'s 15,17,18 and 32 with alveoloplasty and dental cleaning of teeth;  Surgeon: Carol Chroman, DDS;  Location: [facility] OR;  Service: Oral Surgery;  Laterality: N/A;    Social History: Social History   Socioeconomic History   Marital status: Married    Spouse name: Not on file   Number of children: 4   Years of education: Not on file   Highest education level: 8th grade  Occupational History   Not on file  Tobacco Use   Smoking status: Never   Smokeless tobacco: Never  Vaping Use   Vaping status: Never Used  Substance and Sexual Activity   Alcohol use: No   Drug use: No   Sexual activity: Not Currently    Birth control/protection: I.U.D.  Other Topics Concern   Not on file  Social History Narrative   Not on file   Social Drivers of Health   Financial Resource Strain: Medium Risk (11/19/2019)   Overall Financial Resource Strain (CARDIA)    Difficulty of Paying Living Expenses: Somewhat hard  Food Insecurity: No Food Insecurity (11/19/2019)   Hunger Vital Sign    Worried About Running Out of Food in the Last Year: Never true    Ran Out of Food in the Last Year: Never true  Transportation Needs: No Transportation Needs (11/19/2019)   PRAPARE - Administrator, Civil Service (Medical): No    Lack of Transportation (Non-Medical): No  Physical Activity: Insufficiently Active (11/19/2019)   Exercise Vital Sign    Days of Exercise per Week: 1 day    Minutes of Exercise per Session: 30 min  Stress: No Stress Concern Present (11/19/2019)   [employer] of Occupational Health - Occupational Stress Questionnaire    Feeling of Stress : Only a little  Social Connections: Moderately Isolated (11/19/2019)   Social Connection and Isolation Panel [NHANES]     Frequency of Communication with Friends and Family: More than three times a week    Frequency of Social Gatherings with Friends and Family: Never    Attends Religious Services: More than 4 times per year    Active Member of Clubs or Organizations: No  Attends Banker Meetings: Never    Marital Status: Separated  Intimate Partner Violence: Not At Risk (11/19/2019)   Humiliation, Afraid, Rape, and Kick questionnaire    Fear of Current or Ex-Partner: No    Emotionally Abused: No    Physically Abused: No    Sexually Abused: No    Family History: Family History  Problem Relation Age of Onset   Diabetes Mother    Hypertension Mother    Prostatitis Father    ADD / ADHD Daughter    Cerebral palsy Son     Current Medications:  Current Outpatient Medications:    ferrous sulfate  325 (65 FE) MG tablet, Take 1 tablet (325 mg total) by mouth daily., Disp: 30 tablet, Rfl: 0   hydrOXYzine  (ATARAX /VISTARIL ) 25 MG tablet, Take 1 tablet (25 mg total) by mouth every 12 (twelve) hours as needed for anxiety., Disp: 30 tablet, Rfl: 0   levothyroxine  (SYNTHROID ) 75 MCG tablet, Take 75 mcg by mouth daily., Disp: , Rfl:    loratadine (CLARITIN) 10 MG tablet, Take 10 mg by mouth daily as needed. , Disp: , Rfl:    Multiple Vitamins-Minerals (MULTIVITAMIN WITH MINERALS) tablet, Take 1 tablet by mouth daily., Disp: , Rfl:    norethindrone  (MICRONOR ) 0.35 MG tablet, Take 1 tablet (0.35 mg total) by mouth daily., Disp: 28 tablet, Rfl: 11   Allergies: Allergies  Allergen Reactions   No Known Allergies     REVIEW OF SYSTEMS:   Review of Systems  Constitutional:  Negative for chills, fatigue and fever.  HENT:   Positive for trouble swallowing. Negative for lump/mass, mouth sores, nosebleeds and sore throat.   Eyes:  Negative for eye problems.  Respiratory:  Negative for cough and shortness of breath.   Cardiovascular:  Negative for chest pain, leg swelling and palpitations.   Gastrointestinal:  Positive for nausea. Negative for abdominal pain, constipation, diarrhea and vomiting.  Genitourinary:  Negative for bladder incontinence, difficulty urinating, dysuria, frequency, hematuria and nocturia.   Musculoskeletal:  Negative for arthralgias, back pain, flank pain, myalgias and neck pain.  Skin:  Negative for itching and rash.  Neurological:  Positive for numbness. Negative for dizziness and headaches.  Hematological:  Does not bruise/bleed easily.  Psychiatric/Behavioral:  Positive for depression. Negative for sleep disturbance and suicidal ideas. The patient is nervous/anxious.   All other systems reviewed and are negative.    VITALS:   Blood pressure 123/77, pulse 75, temperature 98.4 F (36.9 C), temperature source Oral, resp. rate 16, weight 145 lb 4.5 oz (65.9 kg), SpO2 97%.  Wt Readings from Last 3 Encounters:  05/25/23 145 lb 4.5 oz (65.9 kg)  05/27/22 154 lb 12.8 oz (70.2 kg)  04/08/22 156 lb 8.4 oz (71 kg)    Body mass index is 26.57 kg/m.  Performance status (ECOG): 0 - Asymptomatic  PHYSICAL EXAM:   Physical Exam Vitals and nursing note reviewed. Exam conducted with a chaperone present.  Constitutional:      Appearance: Normal appearance.  Cardiovascular:     Rate and Rhythm: Normal rate and regular rhythm.     Pulses: Normal pulses.     Heart sounds: Normal heart sounds.  Pulmonary:     Effort: Pulmonary effort is normal.     Breath sounds: Normal breath sounds.  Abdominal:     Palpations: Abdomen is soft. There is no hepatomegaly, splenomegaly or mass.     Tenderness: There is no abdominal tenderness.  Musculoskeletal:     Right lower  leg: No edema.     Left lower leg: No edema.  Lymphadenopathy:     Cervical: No cervical adenopathy.     Right cervical: No superficial, deep or posterior cervical adenopathy.    Left cervical: No superficial, deep or posterior cervical adenopathy.     Upper Body:     Right upper body: No  supraclavicular or axillary adenopathy.     Left upper body: No supraclavicular or axillary adenopathy.  Neurological:     General: No focal deficit present.     Mental Status: She is alert and oriented to person, place, and time.  Psychiatric:        Mood and Affect: Mood normal.        Behavior: Behavior normal.     LABS:      Latest Ref Rng & Units 05/18/2023    2:17 PM 05/27/2022    8:36 AM 05/26/2021    9:16 AM  CBC  WBC 4.0 - 10.5 K/uL 5.4  4.5  4.4   Hemoglobin 12.0 - 15.0 g/dL 9.0  16.1  09.6   Hematocrit 36.0 - 46.0 % 30.0  34.4  33.8   Platelets 150 - 400 K/uL 275  261  235       Latest Ref Rng & Units 05/27/2022    8:36 AM 05/26/2021    9:16 AM 11/21/2020    9:08 AM  CMP  Glucose 70 - 99 mg/dL 045  409  811   BUN 6 - 20 mg/dL 13  14  11    Creatinine 0.44 - 1.00 mg/dL 9.14  7.82  9.56   Sodium 135 - 145 mmol/L 137  137  138   Potassium 3.5 - 5.1 mmol/L 3.8  4.1  4.4   Chloride 98 - 111 mmol/L 105  109  104   CO2 22 - 32 mmol/L 27  24  27    Calcium 8.9 - 10.3 mg/dL 9.1  8.8  9.2   Total Protein 6.5 - 8.1 g/dL 7.2  6.8  7.0   Total Bilirubin 0.3 - 1.2 mg/dL 0.7  0.4  1.3   Alkaline Phos 38 - 126 U/L 63  61  60   AST 15 - 41 U/L 17  18  21    ALT 0 - 44 U/L 12  15  13       No results found for: "CEA1", "CEA" / No results found for: "CEA1", "CEA" No results found for: "PSA1" No results found for: "OZH086" No results found for: "CAN125"  No results found for: "TOTALPROTELP", "ALBUMINELP", "A1GS", "A2GS", "BETS", "BETA2SER", "GAMS", "MSPIKE", "SPEI" Lab Results  Component Value Date   TIBC 441 05/18/2023   TIBC 449 05/27/2022   TIBC 398 05/26/2021   FERRITIN 5 (L) 05/18/2023   FERRITIN 3 (L) 05/27/2022   FERRITIN 8 (L) 05/26/2021   IRONPCTSAT 7 (L) 05/18/2023   IRONPCTSAT 8 (L) 05/27/2022   IRONPCTSAT 12 05/26/2021   Lab Results  Component Value Date   LDH 125 11/21/2020   LDH 126 05/15/2020   LDH 116 11/08/2019     STUDIES:   No results found.

## 2023-06-03 ENCOUNTER — Inpatient Hospital Stay: Payer: Self-pay

## 2023-06-03 VITALS — BP 116/70 | HR 64 | Temp 99.0°F | Resp 18

## 2023-06-03 DIAGNOSIS — C090 Malignant neoplasm of tonsillar fossa: Secondary | ICD-10-CM

## 2023-06-03 DIAGNOSIS — Z95828 Presence of other vascular implants and grafts: Secondary | ICD-10-CM

## 2023-06-03 DIAGNOSIS — D508 Other iron deficiency anemias: Secondary | ICD-10-CM

## 2023-06-03 MED ORDER — ACETAMINOPHEN 325 MG PO TABS
650.0000 mg | ORAL_TABLET | Freq: Once | ORAL | Status: AC
  Administered 2023-06-03: 650 mg via ORAL
  Filled 2023-06-03: qty 2

## 2023-06-03 MED ORDER — SODIUM CHLORIDE 0.9 % IV SOLN
500.0000 mg | Freq: Once | INTRAVENOUS | Status: AC
  Administered 2023-06-03: 500 mg via INTRAVENOUS
  Filled 2023-06-03: qty 25

## 2023-06-03 MED ORDER — SODIUM CHLORIDE 0.9 % IV SOLN
Freq: Once | INTRAVENOUS | Status: AC
Start: 2023-06-03 — End: 2023-06-03

## 2023-06-03 MED ORDER — CETIRIZINE HCL 10 MG PO TABS
10.0000 mg | ORAL_TABLET | Freq: Once | ORAL | Status: AC
  Administered 2023-06-03: 10 mg via ORAL
  Filled 2023-06-03: qty 1

## 2023-06-03 NOTE — Patient Instructions (Signed)
 Instrucciones al darle de alta: Discharge Instructions Gracias por elegir al [facility] de Cncer de [place] para br[place] atencin mdica de oncologa y Teacher, English as a foreign language.  Si usted tiene cita de laboratorio con el [facility], por favor entre por la puerta principal y regstrese en el escritorio central de informacin.   Use ropa cmoda y [place] para tener fcil acceso a las vas del Portacath (acceso venoso de larga duracin) o la lnea PICC (catter central colocado por va perifrica).   Nos esforzamos por ofrecerle tiempo de calidad con su proveedor. Es posible que tenga que volver a programar su cita si llega tarde (15 minutos o ms).  El llegar tarde le afecta a usted y a otros pacientes cuyas citas son posteriores a Armed forces operational officer.  Adems, si usted falta a tres o ms citas sin avisar a la oficina, puede ser retirado(a) de la clnica a discrecin del proveedor.      Para las solicitudes de renovacin de recetas, pida a su farmacia que se ponga en contacto con nuestra oficina y deje que transcurran 72 horas para que se complete el proceso de las renovaciones.    Hoy usted recibi los siguientes agentes de quimioterapia e/o inmunoterapia IV Venofer , return as scheduled.   Para ayudar a prevenir las nuseas y los vmitos despus de su tratamiento, le recomendamos que tome su medicamento para las nuseas segn las indicaciones.  LOS SNTOMAS QUE DEBEN COMUNICARSE INMEDIATAMENTE SE INDICAN A CONTINUACIN: *FIEBRE SUPERIOR A 100.4 F (38 C) O MS *ESCALOFROS O SUDORACIN *NUSEAS Y VMITOS QUE NO SE CONTROLAN CON EL MEDICAMENTO PARA LAS NUSEAS *DIFICULTAD INUSUAL PARA RESPIRAR  *MORETONES O HEMORRAGIAS NO HABITUALES *PROBLEMAS URINARIOS (dolor o ardor al Geographical information systems officer o frecuencia para Geographical information systems officer) *PROBLEMAS INTESTINALES (diarrea inusual, estreimiento, dolor cerca del ano) SENSIBILIDAD EN LA BOCA Y EN LA GARGANTA CON O SIN LA PRESENCIA DE LCERAS (dolor de garganta, llagas en la boca o dolor de  muelas/dientes) ERUPCIN, HINCHAZN O DOLORES INUSUALES FLUJO VAGINAL INUSUAL O PICAZN/RASQUIA    Los puntos marcados con un asterisco ( *) indican una posible emergencia y debe hacer un seguimiento tan pronto como le sea posible o vaya al Departamento de Emergencias si se le presenta algn problema.  Por favor, muestre la [place] DE ADVERTENCIA DE Georgie Kiss DE ADVERTENCIA DE Annie Barton al registrarse en [address] de Emergencias y a la enfermera de triaje.  Si tiene preguntas despus de su visita o necesita cancelar o volver a programar su cita, por favor pngase en contacto con [facility] - A DEPT OF [facility] [phone]  y siga las instrucciones. Las horas de oficina son de 8:00 a.m. a 4:30 p.m. de lunes a viernes. Por favor, tenga en cuenta que los mensajes de voz que se dejan despus de las 4:00 p.m. posiblemente no se devolvern hasta el siguiente da de trabajo.  Cerramos los fines de semana y [employer]. En todo momento tiene acceso a una enfermera para preguntas urgentes. Por favor, llame al nmero principal de la clnica [phone] y siga las instrucciones.   Para cualquier pregunta que no sea de carcter urgente, tambin puede ponerse en contacto con su proveedor utilizando MyChart. Ahora ofrecemos visitas electrnicas para cualquier persona mayor de 18 aos que solicite atencin mdica en lnea para los sntomas que no sean urgentes. Para ms detalles vaya a [email or web address].   Tambin puede bajar la aplicacin de MyChart! Vaya a la tienda de aplicaciones, busque "  MyChart", abra la aplicacin, seleccione [place], e ingrese con su nombre de usuario y la contrasea de Clinical cytogeneticist.

## 2023-06-03 NOTE — Progress Notes (Signed)
 Patient tolerated iron infusion with no complaints voiced.  Peripheral IV site clean and dry with good blood return noted before and after infusion.  Band aid applied.  VSS with discharge and left in satisfactory condition with no s/s of distress noted.

## 2023-06-07 ENCOUNTER — Other Ambulatory Visit: Payer: Self-pay

## 2023-06-07 NOTE — Progress Notes (Signed)
 Error

## 2023-06-10 ENCOUNTER — Inpatient Hospital Stay: Payer: Self-pay

## 2023-06-10 VITALS — BP 134/87 | HR 58 | Temp 97.3°F | Resp 18

## 2023-06-10 DIAGNOSIS — D508 Other iron deficiency anemias: Secondary | ICD-10-CM

## 2023-06-10 DIAGNOSIS — C090 Malignant neoplasm of tonsillar fossa: Secondary | ICD-10-CM

## 2023-06-10 DIAGNOSIS — Z95828 Presence of other vascular implants and grafts: Secondary | ICD-10-CM

## 2023-06-10 MED ORDER — SODIUM CHLORIDE 0.9 % IV SOLN
500.0000 mg | Freq: Once | INTRAVENOUS | Status: AC
  Administered 2023-06-10: 500 mg via INTRAVENOUS
  Filled 2023-06-10: qty 25

## 2023-06-10 MED ORDER — SODIUM CHLORIDE 0.9 % IV SOLN: Freq: Once | INTRAVENOUS | Status: AC

## 2023-06-10 NOTE — Progress Notes (Signed)
 Patient presents today for iron  infusion.  Patient is in satisfactory condition with no new complaints voiced.  Vital signs are stable.  We will proceed with infusion per provider orders.    Patient took pre-meds at home prior to arrival. Peripheral IV started with good blood return pre and post infusion.  Venofer  500 mg given today per MD orders. Tolerated infusion without adverse affects. Vital signs stable. No complaints at this time. Discharged from clinic ambulatory in stable condition. Alert and oriented x 3. F/U with [facility] as scheduled.

## 2023-06-10 NOTE — Patient Instructions (Signed)
 Instrucciones al darle de alta: Discharge Instructions Gracias por elegir al [facility] de Cncer de [place] para brindarle atencin mdica de oncologa y Teacher, English as a foreign language.  Si usted tiene cita de laboratorio con el [facility], por favor entre por la puerta principal y regstrese en el escritorio central de informacin.   Use ropa cmoda y [place] para tener fcil acceso a las vas del Portacath (acceso venoso de larga duracin) o la lnea PICC (catter central colocado por va perifrica).   Nos esforzamos por ofrecerle tiempo de calidad con su proveedor. Es posible que tenga que volver a programar su cita si llega tarde (15 minutos o ms).  El llegar tarde le afecta a usted y a otros pacientes cuyas citas son posteriores a Armed forces operational officer.  Adems, si usted falta a tres o ms citas sin avisar a la oficina, puede ser retirado(a) de la clnica a discrecin del proveedor.      Para las solicitudes de renovacin de recetas, pida a su farmacia que se ponga en contacto con nuestra oficina y deje que transcurran 72 horas para que se complete el proceso de las renovaciones.    Hoy usted recibi los siguientes agentes de quimioterapia e/o inmunoterapia Venofer  500 mg   Para ayudar a [employer] nuseas y los vmitos despus de su tratamiento, le recomendamos que tome su medicamento para las nuseas segn las indicaciones.  LOS SNTOMAS QUE DEBEN COMUNICARSE INMEDIATAMENTE SE INDICAN A CONTINUACIN: *FIEBRE SUPERIOR A 100.4 F (38 C) O MS *ESCALOFROS O SUDORACIN *NUSEAS Y VMITOS QUE NO SE CONTROLAN CON EL MEDICAMENTO PARA LAS NUSEAS *DIFICULTAD INUSUAL PARA RESPIRAR  *MORETONES O HEMORRAGIAS NO HABITUALES *PROBLEMAS URINARIOS (dolor o ardor al Geographical information systems officer o frecuencia para Geographical information systems officer) *PROBLEMAS INTESTINALES (diarrea inusual, estreimiento, dolor cerca del ano) SENSIBILIDAD EN LA BOCA Y EN LA GARGANTA CON O SIN LA PRESENCIA DE LCERAS (dolor de garganta, llagas en la boca o dolor de muelas/dientes) ERUPCIN,  HINCHAZN O DOLORES INUSUALES FLUJO VAGINAL INUSUAL O PICAZN/RASQUIA    Los puntos marcados con un asterisco ( *) indican una posible emergencia y debe hacer un seguimiento tan pronto como le sea posible o vaya al Departamento de Emergencias si se le presenta algn problema.  Por favor, muestre la [place] DE ADVERTENCIA DE Georgie Kiss DE ADVERTENCIA DE Annie Barton al registrarse en [address] de Emergencias y a la enfermera de triaje.  Si tiene preguntas despus de su visita o necesita cancelar o volver a programar su cita, por favor pngase en contacto con [facility] - A DEPT OF [facility] [phone]  y siga las instrucciones. Las horas de oficina son de 8:00 a.m. a 4:30 p.m. de lunes a viernes. Por favor, tenga en cuenta que los mensajes de voz que se dejan despus de las 4:00 p.m. posiblemente no se devolvern hasta el siguiente da de trabajo.  Cerramos los fines de semana y [employer]. En todo momento tiene acceso a una enfermera para preguntas urgentes. Por favor, llame al nmero principal de la clnica [phone] y siga las instrucciones.   Para cualquier pregunta que no sea de carcter urgente, tambin puede ponerse en contacto con su proveedor utilizando MyChart. Ahora ofrecemos visitas electrnicas para cualquier persona mayor de 18 aos que solicite atencin mdica en lnea para los sntomas que no sean urgentes. Para ms detalles vaya a [email or web address].   Tambin puede bajar la aplicacin de MyChart! Vaya a la tienda de aplicaciones, busque "MyChart", abra  la aplicacin, seleccione [place], e ingrese con su nombre de usuario y la contrasea de Clinical cytogeneticist.

## 2023-06-24 ENCOUNTER — Ambulatory Visit (HOSPITAL_COMMUNITY)
Admission: RE | Admit: 2023-06-24 | Discharge: 2023-06-24 | Disposition: A | Discharge status: Home / Self Care | Payer: Self-pay | Source: Ambulatory Visit | Attending: Obstetrics and Gynecology | Admitting: Obstetrics and Gynecology

## 2023-06-24 ENCOUNTER — Other Ambulatory Visit: Payer: Self-pay | Admitting: Obstetrics and Gynecology

## 2023-06-24 ENCOUNTER — Inpatient Hospital Stay: Payer: Self-pay | Attending: Hematology | Admitting: *Deleted

## 2023-06-24 VITALS — BP 111/58 | Wt 145.0 lb

## 2023-06-24 DIAGNOSIS — Z1231 Encounter for screening mammogram for malignant neoplasm of breast: Secondary | ICD-10-CM

## 2023-06-24 DIAGNOSIS — Z1239 Encounter for other screening for malignant neoplasm of breast: Secondary | ICD-10-CM

## 2023-06-24 NOTE — Patient Instructions (Signed)
 Explained breast self awareness with Heather Strickland. Patient did not need a Pap smear today due to last Pap smear and HPV typing was 02/09/2022. Let her know BCCCP will cover Pap smears every 3 years unless has a history of abnormal Pap smears. Referred patient to [facility] for a screening mammogram. Appointment scheduled Friday, June 24, 2023 at 1315. Patient aware of appointment and will be there. Let patient know [facility] will follow up with her within the next couple weeks with results of mammogram by letter or phone. Heather Strickland verbalized understanding.  Heather Strickland, Heather Favor, RN 12:28 PM

## 2023-06-24 NOTE — Progress Notes (Signed)
 Heather Strickland is a 48 y.o. female who presents to [facility] clinic today with complaint of occasional left breast pain.    Pap Smear: Pap smear not completed today. Last Pap smear was 02/09/2022 at the [facility] clinic and was normal with negative HPV. Per patient has history of an abnormal Pap smear in 2006 that a colposcopy and LEEP was completed for follow up. Per patient all Pap smears have been normal since LEEP and has had at least three normal Pap smears. Last Pap smear result is available in Epic.   Physical exam: Breasts Breasts symmetrical. No skin abnormalities bilateral breasts. No nipple retraction bilateral breasts. No nipple discharge bilateral breasts. No lymphadenopathy. No lumps palpated bilateral breasts. No complaints of pain or tenderness on exam.  MS DIGITAL SCREENING TOMO BILATERAL Result Date: 03/11/2022 CLINICAL DATA:  Screening. EXAM: DIGITAL SCREENING BILATERAL MAMMOGRAM WITH TOMOSYNTHESIS AND CAD TECHNIQUE: Bilateral screening digital craniocaudal and mediolateral oblique mammograms were obtained. Bilateral screening digital breast tomosynthesis was performed. The images were evaluated with computer-aided detection. COMPARISON:  Previous exam(s). ACR Breast Density Category c: The breasts are heterogeneously dense, which may obscure small masses. FINDINGS: There are no findings suspicious for malignancy. IMPRESSION: No mammographic evidence of malignancy. A result letter of this screening mammogram will be mailed directly to the patient. RECOMMENDATION: Screening mammogram in one year. (Code:SM-B-01Y) BI-RADS CATEGORY  1: Negative. Electronically Signed   By: Rinda Cheers M.D.   On: 03/11/2022 16:02   MS DIGITAL SCREENING TOMO BILATERAL Result Date: 07/01/2020 CLINICAL DATA:  Screening. EXAM: DIGITAL SCREENING BILATERAL MAMMOGRAM WITH TOMOSYNTHESIS AND CAD TECHNIQUE: Bilateral screening digital craniocaudal and mediolateral oblique  mammograms were obtained. Bilateral screening digital breast tomosynthesis was performed. The images were evaluated with computer-aided detection. COMPARISON:  Previous exam(s). ACR Breast Density Category c: The breast tissue is heterogeneously dense, which may obscure small masses. FINDINGS: There are no findings suspicious for malignancy. IMPRESSION: No mammographic evidence of malignancy. A result letter of this screening mammogram will be mailed directly to the patient. RECOMMENDATION: Screening mammogram in one year. (Code:SM-B-01Y) BI-RADS CATEGORY  1: Negative. Electronically Signed   By: Alinda Apley M.D.   On: 07/01/2020 08:32   MS DIGITAL SCREENING TOMO BILATERAL Result Date: 06/30/2019 CLINICAL DATA:  Screening. EXAM: DIGITAL SCREENING BILATERAL MAMMOGRAM WITH TOMO AND CAD COMPARISON:  Previous exam(s). ACR Breast Density Category c: The breast tissue is heterogeneously dense, which may obscure small masses. FINDINGS: There are no findings suspicious for malignancy. Images were processed with CAD. IMPRESSION: No mammographic evidence of malignancy. A result letter of this screening mammogram will be mailed directly to the patient. RECOMMENDATION: Screening mammogram in one year. (Code:SM-B-01Y) BI-RADS CATEGORY  1: Negative. Electronically Signed   By: Allena Ito M.D.   On: 06/30/2019 10:42        Pelvic/Bimanual Pap is not indicated today per BCCCP guidelines.   Smoking History: Patient has never smoked.   Patient Navigation: Patient education provided. Access to services provided for patient through [facility] program. Spanish interpreter Heather Strickland from [facility] provided.   Colorectal Cancer Screening: Per patient has never had colonoscopy completed. FIT Test given to patient to complete. No complaints today.    Breast and Cervical Cancer Risk Assessment: Patient does not have family history of breast cancer, known genetic mutations, or radiation treatment to the chest before  age 65. Per patient has history of cervical dysplasia. Patient has no history of being immunocompromised or DES exposure in-utero.  Risk Scores  as of Encounter on 06/24/2023     Heather Strickland           5-year 0.41%   Lifetime 4.19%            Last calculated by Silas, [name], CMA on 06/24/2023 at 12:15 PM        A: BCCCP exam without pap smear No complaints.  P: Referred patient to [facility] for a screening mammogram. Appointment scheduled Friday, June 24, 2023 at 1315.Heather Edge, RN 06/24/2023 12:28 PM

## 2023-06-29 ENCOUNTER — Other Ambulatory Visit: Payer: Self-pay

## 2023-06-29 DIAGNOSIS — Z1211 Encounter for screening for malignant neoplasm of colon: Secondary | ICD-10-CM

## 2023-11-24 ENCOUNTER — Inpatient Hospital Stay: Payer: Self-pay | Attending: Hematology

## 2023-11-24 DIAGNOSIS — D508 Other iron deficiency anemias: Secondary | ICD-10-CM

## 2023-11-24 DIAGNOSIS — Z85818 Personal history of malignant neoplasm of other sites of lip, oral cavity, and pharynx: Secondary | ICD-10-CM | POA: Insufficient documentation

## 2023-11-24 DIAGNOSIS — D509 Iron deficiency anemia, unspecified: Secondary | ICD-10-CM | POA: Insufficient documentation

## 2023-11-24 DIAGNOSIS — C090 Malignant neoplasm of tonsillar fossa: Secondary | ICD-10-CM

## 2023-11-24 LAB — CBC WITH DIFFERENTIAL/PLATELET
Abs Immature Granulocytes: 0.01 K/uL (ref 0.00–0.07)
Basophils Absolute: 0 K/uL (ref 0.0–0.1)
Basophils Relative: 1 %
Eosinophils Absolute: 0.1 K/uL (ref 0.0–0.5)
Eosinophils Relative: 2 %
HCT: 34.3 % — ABNORMAL LOW (ref 36.0–46.0)
Hemoglobin: 10.8 g/dL — ABNORMAL LOW (ref 12.0–15.0)
Immature Granulocytes: 0 %
Lymphocytes Relative: 27 %
Lymphs Abs: 1.2 K/uL (ref 0.7–4.0)
MCH: 30 pg (ref 26.0–34.0)
MCHC: 31.5 g/dL (ref 30.0–36.0)
MCV: 95.3 fL (ref 80.0–100.0)
Monocytes Absolute: 0.3 K/uL (ref 0.1–1.0)
Monocytes Relative: 6 %
Neutro Abs: 2.7 K/uL (ref 1.7–7.7)
Neutrophils Relative %: 64 %
Platelets: 269 K/uL (ref 150–400)
RBC: 3.6 MIL/uL — ABNORMAL LOW (ref 3.87–5.11)
RDW: 14.2 % (ref 11.5–15.5)
WBC: 4.3 K/uL (ref 4.0–10.5)
nRBC: 0 % (ref 0.0–0.2)

## 2023-11-24 LAB — IRON AND TIBC
Iron: 85 ug/dL (ref 28–170)
Saturation Ratios: 21 % (ref 10.4–31.8)
TIBC: 409 ug/dL (ref 250–450)
UIBC: 324 ug/dL

## 2023-11-24 LAB — TSH: TSH: 32.6 u[IU]/mL — ABNORMAL HIGH (ref 0.350–4.500)

## 2023-11-24 LAB — FERRITIN: Ferritin: 15 ng/mL (ref 11–307)

## 2023-12-01 ENCOUNTER — Inpatient Hospital Stay: Payer: Self-pay | Admitting: Oncology

## 2024-01-09 ENCOUNTER — Encounter: Payer: Self-pay | Admitting: *Deleted
